# Patient Record
Sex: Female | Born: 1975
Health system: Southern US, Community
[De-identification: ages and names within clinical notes are randomized; demographics above are authoritative.]

## PROBLEM LIST (undated history)

## (undated) DIAGNOSIS — R2681 Unsteadiness on feet: Secondary | ICD-10-CM

## (undated) DIAGNOSIS — Z8489 Family history of other specified conditions: Secondary | ICD-10-CM

## (undated) DIAGNOSIS — E785 Hyperlipidemia, unspecified: Secondary | ICD-10-CM

## (undated) DIAGNOSIS — N390 Urinary tract infection, site not specified: Secondary | ICD-10-CM

## (undated) DIAGNOSIS — G709 Myoneural disorder, unspecified: Secondary | ICD-10-CM

## (undated) DIAGNOSIS — G259 Extrapyramidal and movement disorder, unspecified: Secondary | ICD-10-CM

## (undated) DIAGNOSIS — H539 Unspecified visual disturbance: Secondary | ICD-10-CM

## (undated) DIAGNOSIS — R569 Unspecified convulsions: Secondary | ICD-10-CM

## (undated) DIAGNOSIS — F419 Anxiety disorder, unspecified: Secondary | ICD-10-CM

## (undated) DIAGNOSIS — R32 Unspecified urinary incontinence: Secondary | ICD-10-CM

## (undated) DIAGNOSIS — S069X9A Unspecified intracranial injury with loss of consciousness of unspecified duration, initial encounter: Secondary | ICD-10-CM

## (undated) DIAGNOSIS — IMO0002 Reserved for concepts with insufficient information to code with codable children: Secondary | ICD-10-CM

## (undated) DIAGNOSIS — G35 Multiple sclerosis: Secondary | ICD-10-CM

## (undated) HISTORY — DX: Reserved for concepts with insufficient information to code with codable children: IMO0002

## (undated) HISTORY — DX: Extrapyramidal and movement disorder, unspecified: G25.9

## (undated) HISTORY — DX: Multiple sclerosis: G35

## (undated) HISTORY — DX: Unspecified visual disturbance: H53.9

## (undated) HISTORY — PX: COLONOSCOPY: SHX174

## (undated) HISTORY — DX: Hyperlipidemia, unspecified: E78.5

## (undated) HISTORY — DX: Myoneural disorder, unspecified: G70.9

---

## 2000-03-05 DIAGNOSIS — G35 Multiple sclerosis: Secondary | ICD-10-CM

## 2000-03-05 HISTORY — DX: Multiple sclerosis: G35

## 2001-03-05 DIAGNOSIS — R569 Unspecified convulsions: Secondary | ICD-10-CM

## 2001-03-05 DIAGNOSIS — S069X1A Unspecified intracranial injury with loss of consciousness of 30 minutes or less, initial encounter: Secondary | ICD-10-CM

## 2001-03-05 DIAGNOSIS — S069X9A Unspecified intracranial injury with loss of consciousness of unspecified duration, initial encounter: Secondary | ICD-10-CM

## 2001-03-05 HISTORY — DX: Unspecified intracranial injury with loss of consciousness of 30 minutes or less, initial encounter: S06.9X1A

## 2001-03-05 HISTORY — DX: Unspecified intracranial injury with loss of consciousness of unspecified duration, initial encounter: S06.9X9A

## 2001-03-05 HISTORY — DX: Unspecified convulsions: R56.9

## 2001-03-25 ENCOUNTER — Ambulatory Visit (HOSPITAL_COMMUNITY): Admission: RE | Admit: 2001-03-25 | Discharge: 2001-03-25 | Payer: Self-pay | Admitting: Neurology

## 2001-07-18 ENCOUNTER — Inpatient Hospital Stay (HOSPITAL_COMMUNITY): Admission: EM | Admit: 2001-07-18 | Discharge: 2001-07-20 | Payer: Self-pay | Admitting: Emergency Medicine

## 2001-07-18 ENCOUNTER — Encounter: Payer: Self-pay | Admitting: Emergency Medicine

## 2001-07-30 ENCOUNTER — Encounter: Admission: RE | Admit: 2001-07-30 | Discharge: 2001-07-30 | Payer: Self-pay | Admitting: Internal Medicine

## 2001-07-30 ENCOUNTER — Encounter: Payer: Self-pay | Admitting: Internal Medicine

## 2001-09-11 ENCOUNTER — Inpatient Hospital Stay (HOSPITAL_COMMUNITY): Admission: AC | Admit: 2001-09-11 | Discharge: 2001-09-18 | Payer: Self-pay

## 2001-09-11 ENCOUNTER — Encounter: Payer: Self-pay | Admitting: Emergency Medicine

## 2001-09-16 ENCOUNTER — Encounter: Payer: Self-pay | Admitting: Neurological Surgery

## 2002-06-22 ENCOUNTER — Other Ambulatory Visit: Admission: RE | Admit: 2002-06-22 | Discharge: 2002-06-22 | Payer: Self-pay | Admitting: Obstetrics and Gynecology

## 2003-07-02 ENCOUNTER — Other Ambulatory Visit: Admission: RE | Admit: 2003-07-02 | Discharge: 2003-07-02 | Payer: Self-pay | Admitting: Obstetrics and Gynecology

## 2004-06-10 ENCOUNTER — Emergency Department (HOSPITAL_COMMUNITY): Admission: EM | Admit: 2004-06-10 | Discharge: 2004-06-10 | Payer: Self-pay | Admitting: Emergency Medicine

## 2004-08-16 ENCOUNTER — Other Ambulatory Visit: Admission: RE | Admit: 2004-08-16 | Discharge: 2004-08-16 | Payer: Self-pay | Admitting: Gynecology

## 2004-12-27 ENCOUNTER — Other Ambulatory Visit: Admission: RE | Admit: 2004-12-27 | Discharge: 2004-12-27 | Payer: Self-pay | Admitting: Gynecology

## 2005-04-24 ENCOUNTER — Other Ambulatory Visit: Admission: RE | Admit: 2005-04-24 | Discharge: 2005-04-24 | Payer: Self-pay | Admitting: Gynecology

## 2005-08-28 ENCOUNTER — Other Ambulatory Visit: Admission: RE | Admit: 2005-08-28 | Discharge: 2005-08-28 | Payer: Self-pay | Admitting: Gynecology

## 2006-03-05 HISTORY — PX: CERVICAL CONE BIOPSY: SUR198

## 2006-03-20 ENCOUNTER — Other Ambulatory Visit: Admission: RE | Admit: 2006-03-20 | Discharge: 2006-03-20 | Payer: Self-pay | Admitting: Gynecology

## 2006-07-10 ENCOUNTER — Other Ambulatory Visit: Admission: RE | Admit: 2006-07-10 | Discharge: 2006-07-10 | Payer: Self-pay | Admitting: Gynecology

## 2006-12-10 ENCOUNTER — Other Ambulatory Visit: Admission: RE | Admit: 2006-12-10 | Discharge: 2006-12-10 | Payer: Self-pay | Admitting: Gynecology

## 2007-01-13 ENCOUNTER — Emergency Department (HOSPITAL_COMMUNITY): Admission: EM | Admit: 2007-01-13 | Discharge: 2007-01-13 | Payer: Self-pay | Admitting: Family Medicine

## 2007-09-10 ENCOUNTER — Other Ambulatory Visit: Admission: RE | Admit: 2007-09-10 | Discharge: 2007-09-10 | Payer: Self-pay | Admitting: Gynecology

## 2008-01-08 ENCOUNTER — Other Ambulatory Visit: Admission: RE | Admit: 2008-01-08 | Discharge: 2008-01-08 | Payer: Self-pay | Admitting: Gynecology

## 2008-08-12 ENCOUNTER — Emergency Department (HOSPITAL_COMMUNITY): Admission: EM | Admit: 2008-08-12 | Discharge: 2008-08-12 | Payer: Self-pay | Admitting: Family Medicine

## 2008-08-12 ENCOUNTER — Emergency Department (HOSPITAL_COMMUNITY): Admission: EM | Admit: 2008-08-12 | Discharge: 2008-08-13 | Payer: Self-pay | Admitting: Emergency Medicine

## 2008-08-20 ENCOUNTER — Ambulatory Visit (HOSPITAL_COMMUNITY): Admission: RE | Admit: 2008-08-20 | Discharge: 2008-08-20 | Payer: Self-pay | Admitting: Internal Medicine

## 2008-09-14 ENCOUNTER — Ambulatory Visit: Payer: Self-pay | Admitting: Gastroenterology

## 2008-09-14 DIAGNOSIS — R1011 Right upper quadrant pain: Secondary | ICD-10-CM | POA: Insufficient documentation

## 2008-09-14 DIAGNOSIS — G35 Multiple sclerosis: Secondary | ICD-10-CM

## 2008-09-14 DIAGNOSIS — K5900 Constipation, unspecified: Secondary | ICD-10-CM | POA: Insufficient documentation

## 2008-09-14 LAB — CONVERTED CEMR LAB
Basophils Absolute: 0.1 10*3/uL (ref 0.0–0.1)
Bilirubin, Direct: 0.1 mg/dL (ref 0.0–0.3)
CO2: 31 meq/L (ref 19–32)
Calcium: 8.9 mg/dL (ref 8.4–10.5)
Creatinine, Ser: 0.8 mg/dL (ref 0.4–1.2)
Eosinophils Absolute: 0.1 10*3/uL (ref 0.0–0.7)
Ferritin: 16.2 ng/mL (ref 10.0–291.0)
GFR calc non Af Amer: 87.05 mL/min (ref >60)
HCT: 34.7 % — ABNORMAL LOW (ref 36.0–46.0)
Hemoglobin: 12.1 g/dL (ref 12.0–15.0)
Lymphs Abs: 2 10*3/uL (ref 0.7–4.0)
MCHC: 34.8 g/dL (ref 30.0–36.0)
MCV: 90.1 fL (ref 78.0–100.0)
Monocytes Absolute: 0.3 10*3/uL (ref 0.1–1.0)
Neutro Abs: 3.1 10*3/uL (ref 1.4–7.7)
RDW: 12.3 % (ref 11.5–14.6)
Total Bilirubin: 0.4 mg/dL (ref 0.3–1.2)
Total Protein: 7.5 g/dL (ref 6.0–8.3)
Transferrin: 207.7 mg/dL — ABNORMAL LOW (ref 212.0–360.0)
Vitamin B-12: 302 pg/mL (ref 211–911)

## 2008-09-15 ENCOUNTER — Ambulatory Visit: Payer: Self-pay | Admitting: Gastroenterology

## 2008-09-20 ENCOUNTER — Telehealth: Payer: Self-pay | Admitting: Internal Medicine

## 2010-06-12 LAB — DIFFERENTIAL
Basophils Absolute: 0.1 10*3/uL (ref 0.0–0.1)
Lymphocytes Relative: 21 % (ref 12–46)
Neutro Abs: 4.5 10*3/uL (ref 1.7–7.7)
Neutrophils Relative %: 72 % (ref 43–77)

## 2010-06-12 LAB — URINALYSIS, ROUTINE W REFLEX MICROSCOPIC
Nitrite: NEGATIVE
Specific Gravity, Urine: 1.037 — ABNORMAL HIGH (ref 1.005–1.030)
Urobilinogen, UA: 1 mg/dL (ref 0.0–1.0)

## 2010-06-12 LAB — CBC
HCT: 35 % — ABNORMAL LOW (ref 36.0–46.0)
MCV: 92.4 fL (ref 78.0–100.0)
Platelets: 297 10*3/uL (ref 150–400)
WBC: 6.2 10*3/uL (ref 4.0–10.5)

## 2010-06-12 LAB — COMPREHENSIVE METABOLIC PANEL
BUN: 7 mg/dL (ref 6–23)
CO2: 27 mEq/L (ref 19–32)
Chloride: 106 mEq/L (ref 96–112)
Creatinine, Ser: 0.63 mg/dL (ref 0.4–1.2)
GFR calc non Af Amer: >60 mL/min (ref >60)
Total Bilirubin: 0.5 mg/dL (ref 0.3–1.2)

## 2010-06-12 LAB — LIPASE, BLOOD: Lipase: 19 U/L (ref 11–59)

## 2010-07-21 NOTE — Discharge Summary (Signed)
[place]. [facility]  Patient:    Ann Oconnor, Ann Oconnor Visit Number: [id number] MRN: [id number]          Service Type: TRA Location: 3000 3039 01 Attending Physician:  Trauma, Md Dictated by:   Eugenia Pancoast, P.A. Admit Date:  09/11/2001 Discharge Date: 09/18/2001   CC:         Ann Oconnor, M.D.  Stefani Dama, M.D.  Dr. Vickey Huger   Discharge Summary  DATE OF BIRTH: Dec 03, 1975  DISCHARGE DIAGNOSES: 1. Fall. 2. Closed head injury. 3. Subarachnoid hemorrhage. 4. Subdural hemorrhage. 5. Multiple sclerosis.  HISTORY OF PRESENT ILLNESS: The patient is a 35 year old female who is followed by [facility] and also by Dr. Kelli Hope for her multiple sclerosis. The patient was working with PT on the day of admission and apparently fell. She has had a  previous episode of falling about two months ago as well. At this point, she was brought into [facility] emergency room and workup was done. Glasgow coma scale was approximately 10-11. Vital signs were stable. She was somewhat combative. She did respond somewhat to verbal commands. Subsequently, CT scan was done and reviewed and was noted to have subarachnoid blood in the left frontal sinus and small left subdural hematoma. The patient was subsequently hospitalized.  HOSPITAL COURSE: The patient was seen by Dr. Danielle Dess who had followed her for her subarachnoid and subdural hematomas. Repeat CTs were done while in the hospital and they showed improvement of the subdural and subarachnoid hemorrhages. She was also seen by Dr. Vickey Huger for follow-up of her multiple sclerosis. She was started on her Dilantin 100 mg caps at bedtime. She was also started on her usual home medications which were Copaxone 20 mg one injection daily, Paxil CR 20 mg q.d., and Rebit injection on Monday, Wednesday, and Friday. She slow improved. Initially, she would not respond to verbal  stimuli but finally she did awake and began responding appropriately. She had a bedside swallow evaluation on September 15, 2001 which was appropriate. She did well with this. Subsequently, diet was started and she advanced as tolerated. PT was consulted and saw the patient. On September 17, 2001 arrangements were made to have the patient undergo follow-up for outpatient PT and OT. Neurology saw the patient and noted that the patient was able to be discharged. She is doing quite well at this time. She was appropriate and tolerating a diet satisfactorily.  DISPOSITION: At this time, she was prepared for discharge.  DISCHARGE MEDICATIONS: 1. Dilantin 100 mg three pills at bedtime. 2. Vicodin one or two p.o. q.4-6h. p.r.n. pain, #20 with no refills.  FOLLOW-UP: With Dr. Danielle Dess in approximately four to six week. Follow-up with Dr. Thad Ranger and her usual medical doctors as appropriate. She is to call Trauma Service if she has any questions or problems.  DISCHARGE CONDITION: Stable and satisfactory condition. Dictated by:   Eugenia Pancoast, P.A. Attending Physician:  Trauma, Md DD:  09/18/01 TD:  09/22/01 Job: [id number]

## 2010-07-21 NOTE — Discharge Summary (Signed)
[place]. [facility]  Patient:    STARLENE, CONSUEGRA Visit Number: [id number] MRN: [id number]          Service Type: MED Location: 3100 3105 01 Attending Physician:  Cristi Loron Dictated by:   Stefani Dama, M.D. Admit Date:  07/17/2001 Discharge Date: 07/20/2001                             Discharge Summary  ADMISSION DIAGNOSIS:  Closed head injury with skull fracture secondary to fall.  CONDITION ON DISCHARGE:  Improving.  HOSPITAL COURSE:  The patient is a 35 year old individual who fell backwards striking her head on a carpet.  There is no loss of consciousness.  The patient had subsequent headache and was seen at [facility].  Her CT scan demonstrated the presence of a skull fracture and some subarachnoid blood.  She complained of significant headache, had some nausea, vomiting, and dizziness but otherwise was neurologically intact.  She was observed in the hospital during her first 24 hours.  She continued to have significant problems with nausea and headache.  In her second 24 hours, this seemed to improve significantly.  The patient was noted to have some battle sign develop behind the right ear, and this will likely get worse over time.  She has been treated with Darvocet as needed for pain.  At this time, she was discharged to home.  She has been advised to stay out of work at least until August 04, 2001, and the patient can return after that time. She was given a prescription for Darvocet-N 100 #40 without refills. Dictated by:   Stefani Dama, M.D. Attending Physician:  Tressie Stalker D DD:  07/20/01 TD:  07/22/01 Job: [id number]

## 2010-07-21 NOTE — Consult Note (Signed)
[place]. [facility]  Patient:    Ann Oconnor, Ann Oconnor Visit Number: [id number] MRN: [id number]          Service Type: TRA Location: 3000 3039 01 Attending Physician:  Trauma, Md Dictated by:   Stefani Dama, M.D. Proc. Date: 09/11/01 Admit Date:  09/11/2001                            Consultation Report  REQUESTING PHYSICIAN:  Sandria Bales. Ezzard Standing, M.D.  REASON FOR REQUEST:  Closed head injury.  HISTORY OF PRESENT ILLNESS:  The patient is a 35 year old white female, who apparently fell while at physical therapy today.  She had a similar fall apparently about two months ago, where she struck the right parietal occipital region, sustaining a linear skull fracture.  A slight amount of subarachnoid hemorrhage was noted along the convexity on the left hemisphere.  She recovered from that and seemed to be doing well.  The patient has an underlying history of multiple sclerosis which makes her have some difficulty with steadiness on her feet.  She was undergoing physical therapy today and while apparently being upright, fell backwards, striking the right parietal occipital region of her scalp.  She then had a seizure.  The patient was brought to the [facility] Emergency Room.  CT scan of the brain was performed.  This demonstrates that the patient has a significant subgaleal contusion in the right parietal occipital region and again has demonstrated subarachnoid hemorrhage in the left hemisphere, but now there is in addition, a thin, left, subdural hematoma over the convexity.  There is also a slight amount of subarachnoid hemorrhage in the very vertex of the right hemisphere. Since the patients arrival, she has been rather combative when stimulated, but otherwise will tend to sleep with her eyes closed.  She had vomited once on arrival in the emergency department.  Past medical history is as noted.  The patient is currently on interferon injections 3  times a week for her multiple sclerosis in addition to ______ which is taken subcutaneously daily.  She is also on Paxil and another nerve medication.  She is followed by Dr. Jacki Cones.  PHYSICAL EXAMINATION:  VITAL SIGNS:  Heart rate 91, blood pressure normal.  HEAD:  An area of ecchymosis with an abrasion of the scalp over the right parietal occipital region.  Tympanic membranes are clear.  The pupils are 5 mm and sluggish ______  reactive.  The extraocular movements are full.  GENERAL:  The patient sits sleeping quietly unless stimulated to pain, in which case she makes some uncomprehensible utterances.  She does move all four extremities.  She does not follow commands.  NEUROLOGIC:  Deep tendon reflexes are 2+ in both biceps and triceps, 2+ in the patellae and 1+ in the Achilles.  Babinski is downgoing.  Sensation is not testable, as the patient is noncooperative.  IMPRESSION:  The patient has evidence of a closed head injury.  I discussed the significance of this head injury, which seems to be more severe than her previous injury two months ago, which had it taken care of at that time.  I have advised that the patient should be observed in the intensive care unit. She will be seen in the morning with a repeat CT scan being performed to rule out the progression of any injury.  Dictated by:   Stefani Dama, M.D.  Attending Physician:  Trauma,  Md DD:  09/11/01 TD:  09/15/01 Job: [id number]

## 2010-07-21 NOTE — H&P (Signed)
[place]. [facility]  Patient:    Ann Oconnor, Ann Oconnor Visit Number: [id number] MRN: [id number]          Service Type: MED Location: [id number] Attending Physician:  Cristi Loron Dictated by:   Cristi Loron, M.D. Admit Date:  07/17/2001   CC:         Dr. Lin Givens at [facility]   History and Physical  CHIEF COMPLAINT:  Headache.  HISTORY OF PRESENT ILLNESS:  The patient is a 35 year old white female who was in her usual state of health until last evening when a neighbors poodle startled her.  She fell backwards, striking her head on carpet.  There was no loss of consciousness.  This was witnessed by her boyfriend.  The patient had a subsequent headache and was brought to [facility] emergency department via private vehicle where she was evaluated including a cranial CT scan which demonstrated skull fracture and subarachnoid hemorrhage, and neurosurgical consultation was requested.  Presently, the patient complains of headache.  She denies neck pain, back pain, chest pain, abdominal pain, numbness and tingling, seizures, problems with vision and hearing, etc.  She has not noticed any CSF, otorrhea, or rhinorrhea.  She has had some nausea and vomiting.  PAST MEDICAL HISTORY:  Positive for multiple sclerosis which was diagnosed in December 2002.  This is managed by Dr. Lin Givens, a neurologist at [facility].  PAST SURGICAL HISTORY:  None.  MEDICATIONS:  Rebif three times per week.  ALLERGIES:  No known drug allergies.  FAMILY MEDICAL HISTORY:  The patients mother is age 41 in good health.  The patients father is 60 in good health.  SOCIAL HISTORY:  The patient is single, she has no children, she lives in [place].  She is employed in Clinical biochemist for an Scientist, forensic. She denies tobacco, ethanol, or drug use.  REVIEW OF SYSTEMS:  Is as above.  She only complains of headache and  mild photophobia.  PHYSICAL EXAMINATION:  GENERAL:  A pleasant, mildly obese 35 year old white female complaining of headache.  VITAL SIGNS:  Blood pressure 110/55, heart rate 80, respiratory rate 12, oxygen saturation 96% on room air.  HEENT:  Normocephalic.  She has some tenderness with palpation and some soft tissue swelling in the occipital region.  No lacerations.  Her pupils equal and round, and react to light.  Extraocular muscles are intact.  Oropharynx benign, uvula midline.  There is no battle signs, raccoons eyes.  No CSF, otorrhea or rhinorrhea.  NECK:  Supple.  There are no masses, deformities, tracheal deviation.  She has a good cervical range of motion.  Spurlings test is negative.  Lhermittes sign was not present.  THORAX:  Symmetric.  LUNGS:  Clear to auscultation.  HEART:  Regular rate and rhythm.  ABDOMEN:  Soft, nontender.  EXTREMITIES:  No obvious deformities.  BACK:  Normal.  No point tenderness or deformities.  NEUROLOGIC:  The patient is alert and oriented x3.  Cranial nerves II-XII are grossly intact bilaterally.  Vision and hearing are grossly normal bilaterally.  Motor strength is 5/5 in the bilateral deltoid, biceps, triceps, hand grips, wrist extensors, interosseus, psoas, quadriceps, gastrocnemius, extensor hallucis longus.  Sensory exam is intact to light touch in all tested dermatomes bilaterally.  Deep tendon reflexes are 2+/4 in her bilateral biceps, triceps, brachioradialis, quadriceps, gastrocnemius.  She has bilateral flexor plantar reflexes.  No ankle clonus.  LABORATORY DATA:  Imaging studies:  I have reviewed the  patients cranial CT scan performed without contrast at [facility] on Jul 18, 2001.  It demonstrates the patient has a right occipital skull fracture and a second fracture higher up in the right posterior parietal region.  Both fractures are nondisplaced.  She has a small left sylvian fissure subarachnoid  hemorrhage and a possible very small interhemispheric subdural hematoma anteriorly, both without mass effect.  ASSESSMENT AND PLAN:  Closed head injury/skull fracture/subarachnoid hemorrhage.  I have discussed the situation with the patient and her mother and told her that these are not likely to cause surgical problems.  I have recommended we continue to observe her and repeat her CAT scan tomorrow.  If it looks good and she feels better, I will send her home. Dictated by:   Cristi Loron, M.D. Attending Physician:  Tressie Stalker D DD:  07/18/01 TD:  07/19/01 Job: [id number]

## 2010-07-21 NOTE — H&P (Signed)
[place]. [facility]  Patient:    Ann Oconnor, Ann Oconnor Visit Number: [id number] MRN: [id number]          Service Type: TRA Location: 1800 1829 01 Attending Physician:  Trauma, Md Dictated by:   Sandria Bales. Ezzard Standing, M.D. Admit Date:  09/11/2001   CC:         Kelli Hope, M.D.  Stefani Dama, M.D.  Melvyn Novas, M.D.  Dr. Lin Givens, [facility]  [facility]   History and Physical  DATE OF BIRTH:  1975-12-20  HISTORY OF PRESENT ILLNESS:  Ann Oconnor is a 35 year old, white female, who is followed by [facility] and Dr. Kelli Hope for multiple sclerosis.  She was working out Quarry manager with physical therapy at [employer], when she fell, apparently struck her head and had a seizure. The exact sequence is unclear.  She presented to the [facility] Emergency Room as a goal trauma.  She had a Glasgow Coma Scale of between 10-11 but stable vital signs, somewhat combative, not really making sense as far as her words, did respond somewhat to verbal commands.  PAST MEDICAL HISTORY:  She has no allergies.  CURRENT MEDICATIONS:  Rebif 1 shot 3 times a week (this is interferon beta-1a), Copaxone subcu q.d., Paxil 25 mg q.d., and her mother thinks she is taking one more pill for her nerves.  REVIEW OF SYSTEMS:  NEUROLOGIC:  She has had a diagnosis of multiple sclerosis.  Apparently she has had three falls now in the last 3-4 months. Each one seems more serious.  She was actually hospitalized about two months ago with another fall.  PULMONARY:  Does not smoke cigarette, no pneumonia or tuberculosis.  CARDIAC:  No evidence of heart disease, chest pain, hypertension, or dysrhythmia.  GASTROINTESTINAL:  No history of pancreatic disease, liver disease, change in bowel habits.  UROLOGIC:  ______. GYN:  As far as her mother knows, she has regular periods and has never been pregnant.  She is right-handed.  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  PHYSICAL EXAMINATION:  VITAL SIGNS:  Pulse is about 90, blood pressure 130/80.  GENERAL:  She is confused, has vomited and smells of vomit.  HEENT:  Her head shows no obvious laceration.  Her pupils are wandering.  She really cannot follow extraocular movement command, and they are about 3-4+ and symmetric.  She has no obvious oral lesions.  She is in a cervical collar.  LUNGS:  Clear to auscultation.  HEART:  Regular rate and rhythm.  ABDOMEN:  Soft.  She has an old bruise to her left abdomen.  EXTREMITIES:  Along her anterior thigh, she has a couple of bruits which may be newer, but there is no obvious long bone injury of her upper or lower extremities.  She moves all extremities, though entirely purposefully.  I have reviewed the CT scan with Charlett Nose, M.D.  It shows some subarachnoid blood in left frontal sinus, a small left subdural, and an occipital fascia which may very well be old.  She also at this time has pending x-rays of her C-spine, chest x-ray, lumbar spine, and thoracic spine.  Her hemoglobin is 12.1, hematocrit 13.8, white blood count is 4900.  Her PT is 12.7, INR 0.9.  Her PTT is 25.  Sodium 138, potassium 3.3, chloride 104, BUN 11, glucose 119.  ADMISSION IMPRESSION: 1. Fall which led to subarachnoid and subdural blood.  Have consulted  neurosurgery wit.h Dr. Danielle Dess, who has seen her before and    Melvyn Novas, M.D., who is the neurologist on call for Dr. Thad Ranger. 2. Seizure, questionably secondary to fall and bleed.  Will leave dosing of    anticonvulsants to Dr. Danielle Dess and Dr. Vickey Huger. 3. Multiple sclerosis which may actually be the source of her falling. 4. She is mildly hypokalemic.  We will repeat her labs and give her potassium    today. Dictated by:   Sandria Bales. Ezzard Standing, M.D. Attending Physician:  Trauma, Md DD:  09/11/01 TD:  09/11/01 Job: [id number]

## 2010-07-21 NOTE — Consult Note (Signed)
[place]. [facility]  Patient:    Ann Oconnor, Ann Oconnor Visit Number: [id number] MRN: [id number]          Service Type: TRA Location: 3000 3039 01 Attending Physician:  Trauma, Md Dictated by:   Melvyn Novas, M.D. Proc. Date: 09/11/01 Admit Date:  09/11/2001 Discharge Date: 09/18/2001                            Consultation Report  REASON FOR CONSULTATION:  This patient presented to the emergency room after a fall with loss of consciousness and head trauma.  The patient is seen by Dr. Sandria Bales. Ezzard Standing here at the [facility] Emergency Room.  She is accompanied by the physical therapy with whom she was with at the time of the incident.  Her parents have also just arrived.  HISTORY OF PRESENT ILLNESS:  The patient has undergone physical therapy at the time when she "fell backwards" and hit the floor hard with her head.  She was unconscious and the EMS squad was called.  She remained nonresponsive.  Here in the emergency room, she has just arrived and seem to be in seizing.  She presents to the major trauma room and has now responsiveness to auditory and visual stimuli but she is combative and inappropriate and her affect seems to be highly anxious and needs to be restrained to undergo the necessary CT imaging.  PAST MEDICAL HISTORY:  She is a patient of Dr. Kelli Hope at our office at [facility].  He follows her for multiple sclerosis for which she also sees Dr. Lin Givens at [facility].  She is without history of seizures but had one previous event of sudden loss of consciousness where she also fell backwards and also suffered a skull fracture in a fashion equivalent to the events tonight.  CURRENT MEDICATIONS: 1. Levbid once a week. 2. ______ subcutaneously q.d. 3. Paxil 25 mg q.d. 4. Occasional Xanax.  REVIEW OF SYSTEMS:  The patient has a fall in the past according to her mother. Those were like her "legs  were giving out on her."  She is sometimes more tremulous or feels in general weak but had only one other fall in the past that resembles today picture.  No history of pulmonary complaints.  She has no heart disease.  According to the mother, had only a transient period of urinary incontinence while having a urinary tract infection.  She has never missed her period and has never been pregnant.  SOCIAL HISTORY:  She is a right-handed 35 year old who works for the [employer].  The patient is single and lives with her parents.  Nonsmoker and nondrinker.  Scientific laboratory technician.  High school graduate.  Graduate of a Nurse, adult for [employer].  FAMILY HISTORY:  Negative for neurologic diseases.  PHYSICAL EXAMINATION:  VITAL SIGNS:  Heart rate 98, blood pressure 132/80.  LUNGS:  Clear to auscultation.  HEART:  Regular rate and rhythm.  No murmur.  ABDOMEN:  Soft, nontender, and nondistended.  EXTREMITIES:  No edema.  No clubbing and no cyanosis.  NEUROLOGICAL:  The patient is unable to focus her gaze.  The pupils are bilaterally 4 mm and she has sluggish reaction to light.  She avoids the light beam, however, and cannot really follow an accommodation or extraocular movement evaluation.  There is no evidence of facial asymmetry or sensory loss.  Uvula feels midline.  I cannot feel evidence of a tongue bite.  The neck is supple.  The patient is restrained in a collar until cleared by x-ray. Her speech again is somewhat slurred and has now become clearer.  The patient is calling for help and is becoming more combative.  Motor shows the patient moves spontaneously all four extremities.  She had to be restrained.  Deep tendon reflexes are not elicited.  I can also not find a Babinski reflex. Sensory exam had to be deferred under the circumstances.  She does have coordination, gait, and stance.  ASSESSMENT: 1. Status post fall with subarachnoid and subdural black by  CT:  She will    be seen by neurosurgery. 2. Seizure:  This is questionable as the patient seems to have had a tonic    stiffness when she fell backwards without any evidence of protective    reflexes; however, a convulsion was not seen.  The patient was not    incontinent, had no tongue bite so that I am not convinced that a seizure    is the only possible reason for this loss of consciousness and if a seizure    occurred it would have been secondary to the trauma or preceding this    event. 3. History of multiple sclerosis:  Actually, the source of her falling    might be related to an autonomic dysfunction.  The patient is mildly    hypokalemic.  PLAN:  She will be admitted to Dr. Sandria Bales. Ezzard Standing, attending physician of the trauma service, tonight and be followed in the neurological ICU at 3100 floor. The patient will receive 1 gm of Celebrex as a loading dose and converge to 300 mg q.d. either p.o. or IV Celebrex and follow up q.d.  Electrolytes will be balanced according to tomorrows Chem-7.  Tonight, she will receive some potassium supplementation.  She also will continue her MS medications.  A variety of x-rays were diagnostic evaluation of the extent of trauma are still pending and further treatment by the trauma surgeons will depend on both outcomes.  I thank Dr. Sandria Bales. Ezzard Standing very much for this consultation and will inform Dr. Kelli Hope in the morning about the admission of this patient. Dictated by:   Melvyn Novas, M.D. Attending Physician:  Trauma, Md DD:  09/12/01 TD:  09/15/01 Job: [id number]

## 2010-10-26 ENCOUNTER — Ambulatory Visit (HOSPITAL_COMMUNITY)
Admission: RE | Admit: 2010-10-26 | Discharge: 2010-10-26 | Disposition: A | Discharge status: Home / Self Care | Payer: BC Managed Care – PPO | Source: Ambulatory Visit | Attending: Orthopaedic Surgery | Admitting: Orthopaedic Surgery

## 2010-10-26 ENCOUNTER — Other Ambulatory Visit (HOSPITAL_COMMUNITY): Payer: Self-pay | Admitting: Orthopaedic Surgery

## 2010-10-26 ENCOUNTER — Other Ambulatory Visit (HOSPITAL_COMMUNITY): Payer: Self-pay

## 2010-10-26 DIAGNOSIS — M542 Cervicalgia: Secondary | ICD-10-CM

## 2010-10-26 DIAGNOSIS — M79609 Pain in unspecified limb: Secondary | ICD-10-CM | POA: Insufficient documentation

## 2010-10-26 DIAGNOSIS — R9389 Abnormal findings on diagnostic imaging of other specified body structures: Secondary | ICD-10-CM | POA: Insufficient documentation

## 2011-07-24 ENCOUNTER — Ambulatory Visit: Payer: BC Managed Care – PPO | Admitting: Gynecology

## 2011-08-21 ENCOUNTER — Ambulatory Visit (INDEPENDENT_AMBULATORY_CARE_PROVIDER_SITE_OTHER): Payer: PRIVATE HEALTH INSURANCE | Admitting: Gynecology

## 2011-08-21 ENCOUNTER — Encounter: Payer: Self-pay | Admitting: Gynecology

## 2011-08-21 ENCOUNTER — Other Ambulatory Visit (HOSPITAL_COMMUNITY)
Admission: RE | Admit: 2011-08-21 | Discharge: 2011-08-21 | Disposition: A | Discharge status: Home / Self Care | Payer: PRIVATE HEALTH INSURANCE | Source: Ambulatory Visit | Attending: Gynecology | Admitting: Gynecology

## 2011-08-21 VITALS — BP 116/68 | Ht 64.0 in | Wt 223.0 lb

## 2011-08-21 DIAGNOSIS — Z01419 Encounter for gynecological examination (general) (routine) without abnormal findings: Secondary | ICD-10-CM

## 2011-08-21 DIAGNOSIS — R87810 Cervical high risk human papillomavirus (HPV) DNA test positive: Secondary | ICD-10-CM | POA: Insufficient documentation

## 2011-08-21 DIAGNOSIS — N912 Amenorrhea, unspecified: Secondary | ICD-10-CM

## 2011-08-21 LAB — TSH: TSH: 2.871 u[IU]/mL (ref 0.350–4.500)

## 2011-08-21 LAB — PROLACTIN: Prolactin: 4.3 ng/mL

## 2011-08-21 NOTE — Progress Notes (Signed)
Ann Oconnor 12-26-1975 [id number]        36 y.o. G0 new patient for annual exam.  Past medical history,surgical history, medications, allergies, family history and social history were all reviewed and documented in the EPIC chart. ROS:  Was performed and pertinent positives and negatives are included in the history.  Exam: Sherrilyn Rist chaperone present Filed Vitals:   08/21/11 1031  BP: 116/68   General appearance  Normal Skin grossly normal Head/Neck normal with no cervical or supraclavicular adenopathy thyroid normal Lungs  clear Cardiac RR, without RMG Abdominal  soft, nontender, without masses, organomegaly or hernia Breasts  examined lying and sitting without masses, retractions, discharge or axillary adenopathy. Pelvic  Ext/BUS/vagina  normal   Cervix  Status post cone changes Pap/HPV  Uterus  anteverted, normal size, shape and contour, midline and mobile nontender   Adnexa  Without masses or tenderness    Anus and perineum  normal   Rectovaginal  normal sphincter tone without palpated masses or tenderness.    Assessment/Plan:  36 y.o. female for annual exam.    1. Amenorrhea. Patient had been on Depo-Provera from 2011 through September 2012. Has not had a menses during this time nor since her last shot September 2012. She was having regular menses before starting Depo-Provera.  She wants to restart Depo-Provera for menstrual suppression. She is not currently sexually active and it has been over 2 years. Not having hot flushes/night sweats, weight/skin/hair changes or other symptoms. We'll check baseline hCG TSH FSH prolactin. Assuming all negative plan progesterone withdrawal. Discussed Depo-Provera and the issues of long-term use to include loss of calcium and the FDA black box warning. Alternatives to include observation, combination pill/patch/ring, Mirena IUD, Implanon reviewed. Patient's interested in the less frequent/no menses aspect and not necessarily contraception as she is  not sexual active.  Patient wants to proceed with Depo-Provera and will tentatively plan progesterone withdrawal and then Depo-Provera following. 2. Breast health. SBE monthly reviewed. Screening mammogram recommendations between 35 and 40 discussed. She has no strong family history and prefers to wait closer to 40. 3. Pap smear. Pap/HPV done today given her history of cone biopsy. I have no records of these results and she is going to get me a copy of these. 4. Health maintenance. No other blood work was done today as routine health maintenance and blood work is done through her primary physician's office who she sees on a regular basis. 5.    Dara Lords MD, 11:07 AM 08/21/2011

## 2011-08-21 NOTE — Addendum Note (Signed)
Addended by: Richardson Chiquito on: 08/21/2011 11:42 AM   Modules accepted: Orders

## 2011-08-21 NOTE — Patient Instructions (Signed)
Follow up for hormone results. 

## 2011-08-22 ENCOUNTER — Telehealth: Payer: Self-pay | Admitting: Gynecology

## 2011-08-22 MED ORDER — MEDROXYPROGESTERONE ACETATE 10 MG PO TABS: 10.0000 mg | ORAL_TABLET | Freq: Every day | ORAL | Status: DC

## 2011-08-22 NOTE — Telephone Encounter (Signed)
Pt informed with the below note. 

## 2011-08-22 NOTE — Telephone Encounter (Signed)
Tell patient that her hormone studies were all normal. We'll go with progesterone withdrawal as we discussed with Provera 10 mg daily x10 days. She can then arrange for Depo-Provera 150 mg during the withdrawal period time.

## 2011-08-23 ENCOUNTER — Telehealth: Payer: Self-pay | Admitting: *Deleted

## 2011-08-23 ENCOUNTER — Encounter: Payer: Self-pay | Admitting: Gynecology

## 2011-08-23 NOTE — Telephone Encounter (Signed)
Pt was given provera 10 mg tablets x 10 days, pt said she will be going on vacation in July and doesn't want her period to start while on trip she will wait to take the provera.pt said tf was aware of this as well.

## 2011-09-11 ENCOUNTER — Ambulatory Visit (INDEPENDENT_AMBULATORY_CARE_PROVIDER_SITE_OTHER): Payer: PRIVATE HEALTH INSURANCE | Admitting: Gynecology

## 2011-09-11 ENCOUNTER — Encounter: Payer: Self-pay | Admitting: Gynecology

## 2011-09-11 DIAGNOSIS — R87610 Atypical squamous cells of undetermined significance on cytologic smear of cervix (ASC-US): Secondary | ICD-10-CM

## 2011-09-11 DIAGNOSIS — M545 Low back pain: Secondary | ICD-10-CM

## 2011-09-11 DIAGNOSIS — N898 Other specified noninflammatory disorders of vagina: Secondary | ICD-10-CM

## 2011-09-11 DIAGNOSIS — R87810 Cervical high risk human papillomavirus (HPV) DNA test positive: Secondary | ICD-10-CM

## 2011-09-11 LAB — URINALYSIS W MICROSCOPIC + REFLEX CULTURE
Bilirubin Urine: NEGATIVE
Casts: NONE SEEN
Glucose, UA: NEGATIVE mg/dL
Hgb urine dipstick: NEGATIVE
Protein, ur: NEGATIVE mg/dL
RBC / HPF: NONE SEEN RBC/hpf (ref <3)
pH: 5.5 (ref 5.0–8.0)

## 2011-09-11 NOTE — Patient Instructions (Signed)
Office will call you with the biopsy results 

## 2011-09-11 NOTE — Progress Notes (Signed)
Patient ID: EISA CONAWAY, female   DOB: [birth year], 36 y.o.   MRN: [id number] Patient presents for colposcopy. She has a history of cone biopsy for CIN 2 in 2008. She most recently had CIN-1 on colposcopic biopsy with negative ECC 08/2010 at another practice. Her most recent Pap smear here showed ASCUS with positive high-risk HPV.  Exam with Sherrilyn Rist Asst. Pelvic: External BUS vagina with cervix grossly normal high in the vault. Bulging left lateral fornix consistent with submucosal cyst. Bimanual without other palpable vaginal masses or cysts. Uterus grossly normal midline mobile nontender. Adnexa without masses or tenderness.  Colposcopy: Adequate after acetic acid cleansed with small area of acetowhite change at 6:00 transformation zone. Small patch of acetowhite change overlying left lateral vaginal fornix submucosal cyst. Biopsy of the 6:00 transformation zone and acetowhite change overlying the cyst taken. ECC performed. Subsequently cyst wall was biopsied and emptied of mucus material and this biopsy was sent to pathology also.  Physical Exam  Genitourinary:     Assessment and plan: 1. 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 Colposcopy shows 2 areas of acetowhite change both biopsied. ECC performed. Patient will follow up for results. If low-grade and plan expectant management with Pap/HPV 2 testing in one year. 2. Left vaginal fornix cyst. Biopsied with mucoid material and obtaining it. We'll follow expectantly assuming biopsy normal. 3. Low back pain. Patient notes some low back pain. Her UA is unremarkable. We'll follow up on culture. Her exam shows spine straight without CVA tenderness, muscle spasm or other abnormalities.

## 2011-09-13 ENCOUNTER — Telehealth: Payer: Self-pay | Admitting: Gynecology

## 2011-09-13 MED ORDER — NITROFURANTOIN MONOHYD MACRO 100 MG PO CAPS: 100.0000 mg | ORAL_CAPSULE | Freq: Two times a day (BID) | ORAL | Status: AC

## 2011-09-13 NOTE — Telephone Encounter (Signed)
Tell patient that her urine culture did grow out bacteria and I want to treat her with Macrobid 100 mg twice daily for one week

## 2011-09-13 NOTE — Telephone Encounter (Signed)
Pt informed with the below note. 

## 2011-09-14 ENCOUNTER — Telehealth: Payer: Self-pay | Admitting: *Deleted

## 2011-09-14 NOTE — Telephone Encounter (Signed)
Tell patient that biopsies showed low-grade changes and a benign vaginal cyst. Recommend repeat Pap smear/HPV cotest in one year.

## 2011-09-14 NOTE — Telephone Encounter (Signed)
Pt calling requesting recent pathology results,please advise

## 2011-09-14 NOTE — Telephone Encounter (Signed)
Left message for pt to call.

## 2011-09-14 NOTE — Telephone Encounter (Signed)
Pt informed with the below note. 

## 2011-09-17 LAB — URINE CULTURE

## 2011-09-18 ENCOUNTER — Telehealth: Payer: Self-pay | Admitting: Gynecology

## 2011-09-18 MED ORDER — AMPICILLIN 500 MG PO CAPS: 500.0000 mg | ORAL_CAPSULE | Freq: Four times a day (QID) | ORAL | Status: AC

## 2011-09-18 NOTE — Telephone Encounter (Signed)
Pt informed with the below note. 

## 2011-09-18 NOTE — Telephone Encounter (Signed)
Tell patient that the final culture for urine appears to be resistant to the antibiotic that we chose.  I want to switch her to ampicillin 500 mg 4 times a day x5 days.

## 2011-12-03 DIAGNOSIS — R413 Other amnesia: Secondary | ICD-10-CM | POA: Insufficient documentation

## 2012-01-30 ENCOUNTER — Other Ambulatory Visit: Payer: Self-pay | Admitting: Neurology

## 2012-01-30 DIAGNOSIS — R413 Other amnesia: Secondary | ICD-10-CM

## 2012-01-30 DIAGNOSIS — G35 Multiple sclerosis: Secondary | ICD-10-CM

## 2012-02-07 ENCOUNTER — Ambulatory Visit
Admission: RE | Admit: 2012-02-07 | Discharge: 2012-02-07 | Disposition: A | Discharge status: Home / Self Care | Payer: No Typology Code available for payment source | Source: Ambulatory Visit | Attending: Neurology | Admitting: Neurology

## 2012-02-07 DIAGNOSIS — R413 Other amnesia: Secondary | ICD-10-CM

## 2012-02-07 DIAGNOSIS — G35 Multiple sclerosis: Secondary | ICD-10-CM

## 2012-02-07 DIAGNOSIS — G35D Multiple sclerosis, unspecified: Secondary | ICD-10-CM

## 2012-02-07 MED ORDER — GADOBENATE DIMEGLUMINE 529 MG/ML IV SOLN
20.0000 mL | Freq: Once | INTRAVENOUS | Status: AC | PRN
  Administered 2012-02-07: 20 mL via INTRAVENOUS

## 2012-05-30 ENCOUNTER — Encounter: Payer: Self-pay | Admitting: Neurology

## 2012-05-30 ENCOUNTER — Telehealth: Payer: Self-pay

## 2012-05-30 DIAGNOSIS — R413 Other amnesia: Secondary | ICD-10-CM

## 2012-05-30 NOTE — Telephone Encounter (Signed)
I have called her, she complains of lip and tongue numbness since March 26th 2014. No dysarthria, she denies new medication, no other allergic symptoms could potentially do to her MS, she is still taking Gilenya keep her followup,

## 2012-05-30 NOTE — Telephone Encounter (Signed)
Patient states that  She is having problems with her tongue and her lips feeling numb. Patient states if Dr.Yan can call me back and advise me on if this is MS  Related. Tired to schedule apt. Patient did not want to schedule at this time just wanted a call back.

## 2012-06-02 ENCOUNTER — Telehealth: Payer: Self-pay

## 2012-06-02 MED ORDER — METHYLPREDNISOLONE (PAK) 4 MG PO TABS: ORAL_TABLET | ORAL | Status: DC

## 2012-06-02 NOTE — Telephone Encounter (Signed)
I have called, she complains of numbness, tingling at her right face, is not getting better, very bothersome, she denies fever, other upper respiratory or urinary tract infection signs,.  I have called in Medrol Pak for her for multiple sclerosis flareup, likely involving her right brain STEM

## 2012-06-02 NOTE — Telephone Encounter (Signed)
Patient calling states she is still having numbness. Patient wants Dr.Yan to call her in RX.

## 2012-06-09 ENCOUNTER — Telehealth: Payer: Self-pay

## 2012-06-09 NOTE — Telephone Encounter (Signed)
I have called, she still has numbness on right chin lip, no dysarthria, has to be careful when chewing, like MS flare up,

## 2012-06-09 NOTE — Telephone Encounter (Signed)
Patient states she is finished her steroid and she is still having numbness and tingling in her face. Please advise ?

## 2012-06-13 ENCOUNTER — Telehealth: Payer: Self-pay

## 2012-06-13 DIAGNOSIS — G35 Multiple sclerosis: Secondary | ICD-10-CM

## 2012-06-13 NOTE — Telephone Encounter (Signed)
Give her a follow up appt.

## 2012-06-13 NOTE — Telephone Encounter (Signed)
Patient states Dr.Yan wanted me to call back if I was not doing any better still having Numbness please advise.

## 2012-06-18 NOTE — Telephone Encounter (Signed)
Called Patient left her message asking her to call me back for apt.

## 2012-06-19 NOTE — Telephone Encounter (Signed)
Patient wants all her records sent to Dr.Micheal Reynolds at [facility] . Will you ask Dr.Yan for a referral . I have called his office and office states that Referral has to come from Dr.Yan.

## 2012-07-04 DIAGNOSIS — G35 Multiple sclerosis: Secondary | ICD-10-CM | POA: Insufficient documentation

## 2012-07-20 ENCOUNTER — Other Ambulatory Visit: Payer: Self-pay | Admitting: Neurology

## 2012-08-19 ENCOUNTER — Telehealth: Payer: Self-pay | Admitting: *Deleted

## 2012-08-19 NOTE — Telephone Encounter (Signed)
Message copied by Monico Blitz on Tue Aug 19, 2012  2:11 PM ------      Message from: Eugenie Birks      Created: Tue Aug 19, 2012  1:18 PM      Contact: patient       Needs a copy of MRI sent to Dr. Kelli Hope at [facility]. ------

## 2012-09-02 ENCOUNTER — Ambulatory Visit (INDEPENDENT_AMBULATORY_CARE_PROVIDER_SITE_OTHER): Payer: BC Managed Care – PPO | Admitting: Gynecology

## 2012-09-02 ENCOUNTER — Other Ambulatory Visit (HOSPITAL_COMMUNITY)
Admission: RE | Admit: 2012-09-02 | Discharge: 2012-09-02 | Disposition: A | Discharge status: Home / Self Care | Payer: BC Managed Care – PPO | Source: Ambulatory Visit | Attending: Gynecology | Admitting: Gynecology

## 2012-09-02 ENCOUNTER — Encounter: Payer: Self-pay | Admitting: Gynecology

## 2012-09-02 VITALS — BP 124/80 | Ht 64.0 in | Wt 230.0 lb

## 2012-09-02 DIAGNOSIS — N926 Irregular menstruation, unspecified: Secondary | ICD-10-CM

## 2012-09-02 DIAGNOSIS — Z01419 Encounter for gynecological examination (general) (routine) without abnormal findings: Secondary | ICD-10-CM | POA: Insufficient documentation

## 2012-09-02 DIAGNOSIS — R6889 Other general symptoms and signs: Secondary | ICD-10-CM

## 2012-09-02 DIAGNOSIS — IMO0002 Reserved for concepts with insufficient information to code with codable children: Secondary | ICD-10-CM

## 2012-09-02 DIAGNOSIS — Z13220 Encounter for screening for lipoid disorders: Secondary | ICD-10-CM

## 2012-09-02 DIAGNOSIS — Z1151 Encounter for screening for human papillomavirus (HPV): Secondary | ICD-10-CM | POA: Insufficient documentation

## 2012-09-02 DIAGNOSIS — R87810 Cervical high risk human papillomavirus (HPV) DNA test positive: Secondary | ICD-10-CM | POA: Insufficient documentation

## 2012-09-02 LAB — LIPID PANEL
HDL: 42 mg/dL (ref >39)
LDL Cholesterol: 177 mg/dL — ABNORMAL HIGH (ref 0–99)
Total CHOL/HDL Ratio: 5.8 Ratio
Triglycerides: 123 mg/dL (ref <150)

## 2012-09-02 LAB — CBC WITH DIFFERENTIAL/PLATELET
Basophils Absolute: 0 10*3/uL (ref 0.0–0.1)
Basophils Relative: 0 % (ref 0–1)
Eosinophils Absolute: 0.1 10*3/uL (ref 0.0–0.7)
Eosinophils Relative: 2 % (ref 0–5)
HCT: 36.1 % (ref 36.0–46.0)
MCH: 29 pg (ref 26.0–34.0)
MCHC: 32.4 g/dL (ref 30.0–36.0)
Monocytes Absolute: 0.4 10*3/uL (ref 0.1–1.0)
Monocytes Relative: 10 % (ref 3–12)
Neutro Abs: 2.8 10*3/uL (ref 1.7–7.7)
RDW: 13.8 % (ref 11.5–15.5)

## 2012-09-02 LAB — COMPREHENSIVE METABOLIC PANEL
AST: 17 U/L (ref 0–37)
Alkaline Phosphatase: 81 U/L (ref 39–117)
BUN: 7 mg/dL (ref 6–23)
Creat: 0.75 mg/dL (ref 0.50–1.10)
Glucose, Bld: 83 mg/dL (ref 70–99)
Potassium: 4.1 mEq/L (ref 3.5–5.3)
Total Bilirubin: 0.3 mg/dL (ref 0.3–1.2)

## 2012-09-02 NOTE — Progress Notes (Signed)
Ann Oconnor [birth year] [id number]        37 y.o.  G0P0 for annual exam.  Several issues noted below.  Past medical history,surgical history, medications, allergies, family history and social history were all reviewed and documented in the EPIC chart.  ROS:  Performed and pertinent positives and negatives are included in the history, assessment and plan .  Exam: Kim assistant Filed Vitals:   09/02/12 1024  BP: 124/80  Height: 5\' 4"  (1.626 m)  Weight: 230 lb (104.327 kg)   General appearance  Normal Skin grossly normal Head/Neck normal with no cervical or supraclavicular adenopathy thyroid normal Lungs  clear Cardiac RR, without RMG Abdominal  soft, nontender, without masses, organomegaly or hernia Breasts  examined lying and sitting without masses, retractions, discharge or axillary adenopathy. Pelvic  Ext/BUS/vagina  normal   Cervix  normal Pap/HPV  Uterus  anteverted, normal size, shape and contour, midline and mobile nontender   Adnexa  Without masses or tenderness    Anus and perineum  normal   Rectovaginal  normal sphincter tone without palpated masses or tenderness.    Assessment/Plan:  37 y.o. G0P0 female for annual exam.   1. Mild irregular menses. Patient having monthly menses this past year until this past month when she started early in a two-week interval. Lab work last year due to amenorrhea had a normal TSH FSH and prolactin. Recommend keep menstrual calendar now and if resumes monthly regular menses we'll follow. If irregularity continues she'll represent for further evaluation. 2. Contraceptive management. Patient not sexually active and not using anything for contraception. Declined contraception at this time. We'll followup if becomes sexually active and wants to discuss contraception. 3. History LGSIL.  She has a history of cone biopsy for CIN 2 in 2008. She had CIN-1 on colposcopic biopsy with negative ECC 08/2010 at another practice. Her Pap smear 2013 showed  ASCUS with positive high-risk HPV and followup colposcopic biopsy showed LGSIL with negative ECC. Pap/HPV done today. Followup for results. 4. Prior upper vaginal cyst. Exam today is normal with resolution of the prior cyst. Plan annual reexamination. 5. Mammography. Baseline screening mammographic recommendations between 35 and 40 were reviewed. Patient has no strong family history and prefers to wait closer to 40. SBE monthly reviewed. 6. Health maintenance. CBC comprehensive metabolic panel lipid profile urinalysis ordered. Followup one year, sooner as needed.    Dara Lords MD, 11:09 AM 09/02/2012

## 2012-09-02 NOTE — Patient Instructions (Signed)
Keep menstrual calendar. If regular menses and will monitor. If irregular than followup for further evaluation. Followup if contraception becomes an issue. Followup in one year for annual exam.

## 2012-09-03 LAB — URINALYSIS W MICROSCOPIC + REFLEX CULTURE
Casts: NONE SEEN
Hgb urine dipstick: NEGATIVE
Ketones, ur: NEGATIVE mg/dL
Nitrite: NEGATIVE
Protein, ur: NEGATIVE mg/dL
pH: 6.5 (ref 5.0–8.0)

## 2012-09-04 ENCOUNTER — Other Ambulatory Visit: Payer: Self-pay | Admitting: Gynecology

## 2012-09-04 LAB — URINE CULTURE: Colony Count: >=100000

## 2012-09-04 MED ORDER — SULFAMETHOXAZOLE-TRIMETHOPRIM 800-160 MG PO TABS: 1.0000 | ORAL_TABLET | Freq: Two times a day (BID) | ORAL | Status: AC

## 2012-09-15 ENCOUNTER — Other Ambulatory Visit: Payer: Self-pay | Admitting: Neurology

## 2012-09-29 ENCOUNTER — Ambulatory Visit: Payer: BC Managed Care – PPO | Admitting: Gynecology

## 2012-10-02 ENCOUNTER — Ambulatory Visit (INDEPENDENT_AMBULATORY_CARE_PROVIDER_SITE_OTHER): Payer: BC Managed Care – PPO | Admitting: Gynecology

## 2012-10-02 ENCOUNTER — Encounter: Payer: Self-pay | Admitting: Gynecology

## 2012-10-02 DIAGNOSIS — R6889 Other general symptoms and signs: Secondary | ICD-10-CM

## 2012-10-02 DIAGNOSIS — R87810 Cervical high risk human papillomavirus (HPV) DNA test positive: Secondary | ICD-10-CM

## 2012-10-02 DIAGNOSIS — IMO0002 Reserved for concepts with insufficient information to code with codable children: Secondary | ICD-10-CM

## 2012-10-02 NOTE — Patient Instructions (Signed)
Office will call you with biopsy results 

## 2012-10-02 NOTE — Addendum Note (Signed)
Addended by: Dayna Barker on: 10/02/2012 02:24 PM   Modules accepted: Orders

## 2012-10-02 NOTE — Progress Notes (Addendum)
Patient ID: EUFELIA VENO, female   DOB: 11-20-1975, 37 y.o.   MRN: [id number] Patient presents for colposcopy with a history of cone biopsy for CIN 2 in 2008. She had CIN-1 on colposcopic biopsy with negative ECC 08/2010 at another practice. Her Pap smear 2013 showed ASCUS with positive high-risk HPV and followup colposcopic biopsy showed LGSIL with negative ECC. Most recent Pap/HPV showed LGSIL with positive high-risk HPV.  Exam with Selena Batten External BUS vagina normal. Cervix grossly normal.  Colposcopy after acetic acid cleanse adequate showing mild cervical scarring but no abnormalities. ECC performed. Physical Exam  Genitourinary:      Assessment and plan: Persistent low-grade SIL with positive high-risk HPV. Colposcopy today was adequate normal with some puckering and scarring from her cone biopsy. ECC performed. We'll triage based on results. Issues of persistent dysplasia with positive high-risk HPV reviewed. Possibilities for progression/regression discussed. Assuming ECC negative and plan expected management. Otherwise then we'll discuss treatment options.

## 2012-10-15 ENCOUNTER — Other Ambulatory Visit: Payer: Self-pay | Admitting: *Deleted

## 2012-10-15 MED ORDER — GABAPENTIN 400 MG PO CAPS: 400.0000 mg | ORAL_CAPSULE | Freq: Two times a day (BID) | ORAL | Status: DC

## 2013-02-05 ENCOUNTER — Other Ambulatory Visit: Payer: Self-pay | Admitting: Neurology

## 2013-02-05 ENCOUNTER — Telehealth: Payer: Self-pay | Admitting: *Deleted

## 2013-02-05 DIAGNOSIS — Z139 Encounter for screening, unspecified: Secondary | ICD-10-CM

## 2013-02-05 NOTE — Telephone Encounter (Signed)
Pt has a bio-metric form that must be completed for her insurance. She will needed a recheck of cholesterol, glucose. Pt had this done in July at annual but result must be done after Oct. 30 th. Okay for labs to be done?

## 2013-02-05 NOTE — Telephone Encounter (Signed)
Okay for fasting lipid profile and glucose. If there are other labs she needs they can be added to this.

## 2013-02-06 ENCOUNTER — Other Ambulatory Visit: Payer: BC Managed Care – PPO

## 2013-02-06 NOTE — Telephone Encounter (Signed)
Left the below on pt voicemail, orders placed, pt said she would like to have height and weight and B/P checked as well.

## 2013-02-09 ENCOUNTER — Other Ambulatory Visit: Payer: BC Managed Care – PPO

## 2013-02-09 DIAGNOSIS — Z139 Encounter for screening, unspecified: Secondary | ICD-10-CM

## 2013-02-09 LAB — GLUCOSE, RANDOM: Glucose, Bld: 88 mg/dL (ref 70–99)

## 2013-02-09 LAB — LIPID PANEL
Cholesterol: 217 mg/dL — ABNORMAL HIGH (ref 0–200)
Total CHOL/HDL Ratio: 4.8 Ratio

## 2013-02-10 ENCOUNTER — Other Ambulatory Visit: Payer: Self-pay | Admitting: Diagnostic Neuroimaging

## 2013-03-05 ENCOUNTER — Other Ambulatory Visit: Payer: Self-pay | Admitting: Neurology

## 2013-04-02 ENCOUNTER — Other Ambulatory Visit: Payer: Self-pay | Admitting: Neurology

## 2013-04-30 ENCOUNTER — Other Ambulatory Visit: Payer: Self-pay | Admitting: Neurology

## 2014-04-02 ENCOUNTER — Encounter: Payer: BC Managed Care – PPO | Admitting: Gynecology

## 2014-04-21 ENCOUNTER — Encounter (HOSPITAL_COMMUNITY): Payer: Self-pay | Admitting: *Deleted

## 2014-04-21 ENCOUNTER — Emergency Department (HOSPITAL_COMMUNITY)
Admission: EM | Admit: 2014-04-21 | Discharge: 2014-04-21 | Disposition: A | Discharge status: Home / Self Care | Payer: BLUE CROSS/BLUE SHIELD | Attending: Emergency Medicine | Admitting: Emergency Medicine

## 2014-04-21 DIAGNOSIS — Z79899 Other long term (current) drug therapy: Secondary | ICD-10-CM | POA: Diagnosis not present

## 2014-04-21 DIAGNOSIS — G35 Multiple sclerosis: Secondary | ICD-10-CM

## 2014-04-21 DIAGNOSIS — R531 Weakness: Secondary | ICD-10-CM | POA: Diagnosis present

## 2014-04-21 LAB — URINE MICROSCOPIC-ADD ON

## 2014-04-21 LAB — BASIC METABOLIC PANEL
Anion gap: 10 (ref 5–15)
BUN: 8 mg/dL (ref 6–23)
CO2: 22 mmol/L (ref 19–32)
Calcium: 9 mg/dL (ref 8.4–10.5)
Chloride: 110 mmol/L (ref 96–112)
Creatinine, Ser: 0.5 mg/dL (ref 0.50–1.10)
GFR calc Af Amer: >90 mL/min (ref >90)
GFR calc non Af Amer: >90 mL/min (ref >90)
Glucose, Bld: 91 mg/dL (ref 70–99)
Potassium: 3.6 mmol/L (ref 3.5–5.1)
Sodium: 142 mmol/L (ref 135–145)

## 2014-04-21 LAB — URINALYSIS, ROUTINE W REFLEX MICROSCOPIC
Bilirubin Urine: NEGATIVE
Glucose, UA: NEGATIVE mg/dL
Ketones, ur: >80 mg/dL — AB
Leukocytes, UA: NEGATIVE
Nitrite: NEGATIVE
Protein, ur: NEGATIVE mg/dL
Specific Gravity, Urine: 1.02 (ref 1.005–1.030)
Urobilinogen, UA: 1 mg/dL (ref 0.0–1.0)
pH: 5.5 (ref 5.0–8.0)

## 2014-04-21 LAB — CBC WITH DIFFERENTIAL/PLATELET
Basophils Absolute: 0 10*3/uL (ref 0.0–0.1)
Basophils Relative: 0 % (ref 0–1)
Eosinophils Absolute: 0.1 10*3/uL (ref 0.0–0.7)
Eosinophils Relative: 1 % (ref 0–5)
HCT: 32.1 % — ABNORMAL LOW (ref 36.0–46.0)
Hemoglobin: 10.6 g/dL — ABNORMAL LOW (ref 12.0–15.0)
Lymphocytes Relative: 20 % (ref 12–46)
Lymphs Abs: 1.4 10*3/uL (ref 0.7–4.0)
MCH: 31.1 pg (ref 26.0–34.0)
MCHC: 33 g/dL (ref 30.0–36.0)
MCV: 94.1 fL (ref 78.0–100.0)
Monocytes Absolute: 0.6 10*3/uL (ref 0.1–1.0)
Monocytes Relative: 8 % (ref 3–12)
Neutro Abs: 4.9 10*3/uL (ref 1.7–7.7)
Neutrophils Relative %: 71 % (ref 43–77)
Platelets: 331 10*3/uL (ref 150–400)
RBC: 3.41 MIL/uL — ABNORMAL LOW (ref 3.87–5.11)
RDW: 16.2 % — ABNORMAL HIGH (ref 11.5–15.5)
WBC: 7 10*3/uL (ref 4.0–10.5)

## 2014-04-21 MED ORDER — SODIUM CHLORIDE 0.9 % IV BOLUS (SEPSIS)
1000.0000 mL | Freq: Once | INTRAVENOUS | Status: AC
  Administered 2014-04-21: 1000 mL via INTRAVENOUS

## 2014-04-21 NOTE — ED Notes (Signed)
Pt arrives via ems. Pt has history of MS. Pt was just d/c yesterday after 11 days from [facility] for the same, had plasma exchange completed yesterday. Sent home. Pt has had multiple falls since coming home, describes progressive weakness in bilateral lower extremities

## 2014-04-21 NOTE — ED Provider Notes (Signed)
CSN: [id number]     Arrival date & time 04/21/14  1524 History   First MD Initiated Contact with Patient 04/21/14 1609     Chief Complaint  Patient presents with  . Weakness     (Consider location/radiation/quality/duration/timing/severity/associated sxs/prior Treatment) HPI  Patient presents to the emergency department with lower for any weakness has been ongoing for several months.  The patient has MS and has a worsening in her condition.  She recently spent 12 days at [facility].  She was discharged with outpatient therapy.  From there, but states that she has been having weakness in her legs and falling at home and unable to take care of herself.  Patient denies chest pain, shortness of breath, headache, blurred vision, incontinence, dysuria, abdominal pain, nausea, vomiting, diarrhea Past Medical History  Diagnosis Date  . MS (multiple sclerosis) 2002  . LGSIL (low grade squamous intraepithelial dysplasia)    Past Surgical History  Procedure Laterality Date  . Cervical cone biopsy  2008    CIN 2   Family History  Problem Relation Age of Onset  . Thyroid disease Mother   . Thyroid disease Sister   . Cancer Paternal Aunt     not sure what kind, maybe cervix  . Thyroid disease Sister    History  Substance Use Topics  . Smoking status: Never Smoker   . Smokeless tobacco: Never Used  . Alcohol Use: No     Comment: Rare   OB History    Gravida Para Term Preterm AB TAB SAB Ectopic Multiple Living   0              Review of Systems  All other systems negative except as documented in the HPI. All pertinent positives and negatives as reviewed in the HPI.  Allergies  Review of patient's allergies indicates no known allergies.  Home Medications   Prior to Admission medications   Medication Sig Start Date End Date Taking? Authorizing Provider  clonazePAM (KLONOPIN) 0.5 MG tablet TAKE 1 TABLET BY MOUTH EVERY NIGHT AT BEDTIME 07/20/12  Yes Marcial Pacas, MD   Dimethyl Fumarate 240 MG CPDR Take 1 capsule by mouth 2 (two) times daily.   Yes Historical Provider, MD  gabapentin (NEURONTIN) 400 MG capsule Take 1 capsule (400 mg total) by mouth 2 (two) times daily. 10/15/12  Yes Marcial Pacas, MD  PARoxetine (PAXIL-CR) 37.5 MG 24 hr tablet TAKE 1 TABLET BY MOUTH DAILY   Yes Marcial Pacas, MD   BP 112/69 mmHg  Pulse 97  Temp(Src) 98.2 F (36.8 C) (Oral)  Resp 20  Ht 5\' 4"  (1.626 m)  Wt 245 lb (111.131 kg)  BMI 42.03 kg/m2  SpO2 99%  LMP 04/18/2014 Physical Exam  Constitutional: She is oriented to person, place, and time. She appears well-developed and well-nourished. No distress.  HENT:  Head: Normocephalic and atraumatic.  Mouth/Throat: Oropharynx is clear and moist.  Eyes: Pupils are equal, round, and reactive to light.  Neck: Normal range of motion. Neck supple.  Cardiovascular: Normal rate, regular rhythm and normal heart sounds.  Exam reveals no gallop and no friction rub.   No murmur heard. Pulmonary/Chest: Effort normal and breath sounds normal. No respiratory distress.  Neurological: She is alert and oriented to person, place, and time. She displays normal reflexes. No cranial nerve deficit. She exhibits normal muscle tone. Coordination normal.  Skin: Skin is warm and dry. No rash noted. No erythema.  Nursing note and vitals reviewed.   ED  Course  Procedures (including critical care time) Labs Review Labs Reviewed  CBC WITH DIFFERENTIAL/PLATELET - Abnormal; Notable for the following:    RBC 3.41 (*)    Hemoglobin 10.6 (*)    HCT 32.1 (*)    RDW 16.2 (*)    All other components within normal limits  URINALYSIS, ROUTINE W REFLEX MICROSCOPIC - Abnormal; Notable for the following:    APPearance CLOUDY (*)    Hgb urine dipstick LARGE (*)    Ketones, ur >80 (*)    All other components within normal limits  URINE MICROSCOPIC-ADD ON - Abnormal; Notable for the following:    Bacteria, UA FEW (*)    All other components within normal limits   BASIC METABOLIC PANEL    Imaging Review No results found.   EKG Interpretation   Date/Time:  Wednesday April 21 2014 18:07:29 EST Ventricular Rate:  56 PR Interval:  178 QRS Duration: 119 QT Interval:  483 QTC Calculation: 466 R Axis:   6 Text Interpretation:  Sinus rhythm Incomplete right bundle branch block  Confirmed by Ashok Cordia  MD, Lennette Bihari ([id number]) on 04/21/2014 6:14:17 PM      MDM   Final diagnoses:  None    I spoke with social work and case Freight forwarder who will work on outpatient placement for rehabilitation facility.  Patient is advised of the plan and all questions were answered.  He is also advised follow-up with her doctors at [facility], PA-C 04/21/14 2040  Mirna Mires, MD 04/24/14 [phone]

## 2014-04-21 NOTE — ED Notes (Signed)
Pt stood up at the bedside without assist. Pt with full weightbearing on extremities without difficulty, denied dizziness with changing positions. Pt took one step forward and said her legs were giving out and she needed to sit down. Pt was then able to stand back up without assist. Pt able to take 5 steps forward in the room, full weight bearing, stand by assist, before she said she felt her legs were going to give out and she needed to sit back down, taking steps backwards back into the bed.

## 2014-04-21 NOTE — ED Notes (Signed)
Bed: NP00 Expected date:  Expected time:  Means of arrival:  Comments: EMS-MS exaserbation

## 2014-04-21 NOTE — ED Notes (Signed)
Waiting to speak with social work/case management before leaving.

## 2014-04-21 NOTE — Progress Notes (Signed)
Home health orders faxed to [facility] at 2157pm with confrimation of receipt at 2159pm.

## 2014-04-21 NOTE — Discharge Instructions (Signed)
Return here as needed.  Follow-up with the resources provided. °

## 2014-04-21 NOTE — Progress Notes (Signed)
  CARE MANAGEMENT ED NOTE 04/21/2014  Patient:  Ann Oconnor, Ann Oconnor   Account Number:  [id number]  Date Initiated:  04/21/2014  Documentation initiated by:  Livia Snellen  Subjective/Objective Assessment:   Patient presents to Ed with multiple falls at home since discharge from [place] post 11 day stay.     Subjective/Objective Assessment Detail:   Patient with pmhx of MS.     Action/Plan:   Action/Plan Detail:   Anticipated DC Date:  04/21/2014     Status Recommendation to Physician:   Result of Recommendation:    Other ED Services  Consult Working Plan   In-house referral  Clinical Social Worker   DC Forensic scientist  CM consult  Other   [facility] Choice  HOME HEALTH   Choice offered to / List presented to:  C-1 Patient     [place] arranged  HH-1 RN  [place]      [place].    Status of service:  Completed, signed off  ED Comments:   ED Comments Detail:  [facility] consulted to speak to patient reagrding homehealth services.  EDCM spoke to patient and her mother at bedside. Patient lives at home with her parents.  Patient reports they live in a "single wide trailer."  Patient has a walker, rolator, BSC, transport chair and scooter at home. Patient is without home health services at this time. Patient voiced wanting to go to [facility].  [facility] consulted EDSW.  Patient voiced concerns about falls at home and difficulty getting to the bathroom.  EDCM explained home health services to patient. EDCM explained with home health, the patient may receive a visiting RN, PT, Ot, aide and social worker if needed for placement. EDCM also provided patient with list of private duty nursing agencies and informed patient it would be an out of pocket expense for her.  Patient verbalized understanding. EDCM advised patient to keep her equipment close to her such as her walker and bedside commode, rest when needed. Have a chair  close to her or use scooter if legs feeling weak that day so that she may sit safely.  EDCM advised patient to keep floor clear of objects, throw rugs etc. to prevent falls.  EDCM informed patient and her mother that patient's pcp and home health social worker can assist with placement from home if needed.  Per EDRN note, patient able to stand without assistance full weight bearing, and took a few steps and then needed to sit after legs feeling weak, but then was able to stand again without assistance full weight bearing.    [facility] informed patient of this information.  EDCM asked patient, Do you feel okay about going home? Patient stated, "Yes".  EDCM provided patient with list of home health agencies in [facility] of whiich [facility] was chosen since patient stated, "i have an account with them."  [facility] discussed patient with EDSW, EDP, EDPA.  Orders placed for home health RN, PT, OT, aide and social worker.  [facility] requested on referral to [facility] to please contact patient ASAP.  Patient and patient's mother thankful for services.  No further EDCM needs at this time.

## 2014-04-21 NOTE — Progress Notes (Signed)
39 yr old [facility] [place] pt d/c from [place] on 04/21/14 and had plasma exchange completed yesterday. Sent home. Pt has had multiple falls since coming home, describes progressive weakness in bilateral lower extremities.  ED CMs consulted by EDP, Ashok Cordia, and ED PA/NP C Lawyer [facility] ED called to [facility] place to [place] at [phone] to inquired if available bed for pt Left a voice message for Angela Nevin, admission coordinator to return a call to ED SW if there is an available bed for pt Pending return call  Note in care everywhere indicate pt called at 1250 pm 04/21/14 to Dr Doy Mince, [place] provider, office to request placement in rehab states no walking and parents unable to help her to bathroom.  No home health setup.  [facility] provider [facility]" to get to rehab.

## 2014-04-22 NOTE — Progress Notes (Signed)
CSW was notified by Nurse CM that pt presents to [facility] due to falling at home.   CSW reached out to [facility], which is where the pt wanted to be admitted. CSW spoke with [place]. Maz[place] states that she has never given the pt a bed offer. She informed CSW that she spoke with mom and informed her that she would need to fax her discharge papers that she received from [facility] in order to be considered. However, she states that mom did not fax her the requested documents. Pt was informed of conversation with [place].  Pt voiced to CSW that she was ready to go home. Pt states that she can feed herself independently, and can sometimes walk well but at other times needs assistance. She says that she does have equipment. Mom states the pt has good upper body movement.  CSW met with pt at bedside. Mom was present. Nurse CM was present at bedside, who has ordered home health for the pt. Per note. Pt will receive RN,pt,OT, aide and SW.   CSW asked pt if she and mother had any questions. However, they do not have any at this time.  Willette Brace [phone] ED CSW 04/22/2014 12:34 AM

## 2014-04-22 NOTE — Progress Notes (Signed)
EDCM spoke to [place] of [facility] at 1713pm who confirms referral for home health services was received and patient is scheduled to be seen tomorrow for services.  EDCM called patient at this time to inform her that [facility] will be contacting her tomorrow for services.  [facility] asked patient how she was doing?  Patient stated, "I'm the same."  Patient thanked [facility] for services.  No further EDCM needs at this time.

## 2014-04-28 ENCOUNTER — Encounter (HOSPITAL_COMMUNITY): Payer: Self-pay | Admitting: Family Medicine

## 2014-04-28 ENCOUNTER — Observation Stay (HOSPITAL_COMMUNITY): Payer: BLUE CROSS/BLUE SHIELD

## 2014-04-28 ENCOUNTER — Inpatient Hospital Stay (HOSPITAL_COMMUNITY)
Admission: EM | Admit: 2014-04-28 | Discharge: 2014-05-03 | DRG: 059 | Disposition: A | Discharge status: Skilled Nursing Facility | Payer: BLUE CROSS/BLUE SHIELD | Attending: Internal Medicine | Admitting: Internal Medicine

## 2014-04-28 DIAGNOSIS — M6282 Rhabdomyolysis: Secondary | ICD-10-CM | POA: Diagnosis present

## 2014-04-28 DIAGNOSIS — K5900 Constipation, unspecified: Secondary | ICD-10-CM | POA: Diagnosis present

## 2014-04-28 DIAGNOSIS — Z79899 Other long term (current) drug therapy: Secondary | ICD-10-CM

## 2014-04-28 DIAGNOSIS — R296 Repeated falls: Secondary | ICD-10-CM | POA: Diagnosis present

## 2014-04-28 DIAGNOSIS — G35 Multiple sclerosis: Principal | ICD-10-CM | POA: Diagnosis present

## 2014-04-28 DIAGNOSIS — Z87410 Personal history of cervical dysplasia: Secondary | ICD-10-CM

## 2014-04-28 DIAGNOSIS — R112 Nausea with vomiting, unspecified: Secondary | ICD-10-CM | POA: Diagnosis present

## 2014-04-28 DIAGNOSIS — G8929 Other chronic pain: Secondary | ICD-10-CM | POA: Diagnosis present

## 2014-04-28 DIAGNOSIS — R200 Anesthesia of skin: Secondary | ICD-10-CM | POA: Diagnosis present

## 2014-04-28 DIAGNOSIS — F329 Major depressive disorder, single episode, unspecified: Secondary | ICD-10-CM | POA: Diagnosis present

## 2014-04-28 DIAGNOSIS — R29898 Other symptoms and signs involving the musculoskeletal system: Secondary | ICD-10-CM | POA: Diagnosis present

## 2014-04-28 DIAGNOSIS — Z6841 Body Mass Index (BMI) 40.0 and over, adult: Secondary | ICD-10-CM

## 2014-04-28 DIAGNOSIS — I10 Essential (primary) hypertension: Secondary | ICD-10-CM | POA: Diagnosis present

## 2014-04-28 DIAGNOSIS — E860 Dehydration: Secondary | ICD-10-CM | POA: Diagnosis present

## 2014-04-28 LAB — LIPASE, BLOOD: Lipase: 22 U/L (ref 11–59)

## 2014-04-28 LAB — URINALYSIS, ROUTINE W REFLEX MICROSCOPIC
BILIRUBIN URINE: NEGATIVE
GLUCOSE, UA: NEGATIVE mg/dL
HGB URINE DIPSTICK: NEGATIVE
Ketones, ur: >80 mg/dL — AB
LEUKOCYTES UA: NEGATIVE
Nitrite: NEGATIVE
Protein, ur: NEGATIVE mg/dL
Specific Gravity, Urine: 1.025 (ref 1.005–1.030)
Urobilinogen, UA: 0.2 mg/dL (ref 0.0–1.0)
pH: 6 (ref 5.0–8.0)

## 2014-04-28 LAB — COMPREHENSIVE METABOLIC PANEL
ALT: 25 U/L (ref 0–35)
ANION GAP: 11 (ref 5–15)
AST: 46 U/L — ABNORMAL HIGH (ref 0–37)
Albumin: 5.6 g/dL — ABNORMAL HIGH (ref 3.5–5.2)
Alkaline Phosphatase: 43 U/L (ref 39–117)
BILIRUBIN TOTAL: 1.1 mg/dL (ref 0.3–1.2)
BUN: 8 mg/dL (ref 6–23)
CHLORIDE: 102 mmol/L (ref 96–112)
CO2: 22 mmol/L (ref 19–32)
CREATININE: 0.53 mg/dL (ref 0.50–1.10)
Calcium: 9.4 mg/dL (ref 8.4–10.5)
GFR calc Af Amer: >90 mL/min (ref >90)
GFR calc non Af Amer: >90 mL/min (ref >90)
Glucose, Bld: 101 mg/dL — ABNORMAL HIGH (ref 70–99)
Potassium: 4.1 mmol/L (ref 3.5–5.1)
Sodium: 135 mmol/L (ref 135–145)
TOTAL PROTEIN: 7.8 g/dL (ref 6.0–8.3)

## 2014-04-28 LAB — GLUCOSE, CAPILLARY
GLUCOSE-CAPILLARY: 87 mg/dL (ref 70–99)
Glucose-Capillary: 124 mg/dL — ABNORMAL HIGH (ref 70–99)

## 2014-04-28 LAB — CBC WITH DIFFERENTIAL/PLATELET
BASOS ABS: 0 10*3/uL (ref 0.0–0.1)
BASOS PCT: 0 % (ref 0–1)
Eosinophils Absolute: 0.1 10*3/uL (ref 0.0–0.7)
Eosinophils Relative: 1 % (ref 0–5)
HEMATOCRIT: 36.5 % (ref 36.0–46.0)
Hemoglobin: 12 g/dL (ref 12.0–15.0)
LYMPHS PCT: 14 % (ref 12–46)
Lymphs Abs: 1.2 10*3/uL (ref 0.7–4.0)
MCH: 31.1 pg (ref 26.0–34.0)
MCHC: 32.9 g/dL (ref 30.0–36.0)
MCV: 94.6 fL (ref 78.0–100.0)
Monocytes Absolute: 0.8 10*3/uL (ref 0.1–1.0)
Monocytes Relative: 10 % (ref 3–12)
NEUTROS ABS: 6.3 10*3/uL (ref 1.7–7.7)
Neutrophils Relative %: 75 % (ref 43–77)
PLATELETS: 524 10*3/uL — AB (ref 150–400)
RBC: 3.86 MIL/uL — ABNORMAL LOW (ref 3.87–5.11)
RDW: 16.2 % — ABNORMAL HIGH (ref 11.5–15.5)
WBC: 8.4 10*3/uL (ref 4.0–10.5)

## 2014-04-28 LAB — CK: Total CK: 815 U/L — ABNORMAL HIGH (ref 7–177)

## 2014-04-28 LAB — PREGNANCY, URINE: Preg Test, Ur: NEGATIVE

## 2014-04-28 MED ORDER — SODIUM CHLORIDE 0.9 % IV SOLN
500.0000 mg | Freq: Two times a day (BID) | INTRAVENOUS | Status: AC
  Administered 2014-04-28 – 2014-05-02 (×10): 500 mg via INTRAVENOUS
  Filled 2014-04-28 (×11): qty 4

## 2014-04-28 MED ORDER — IRBESARTAN 75 MG PO TABS
75.0000 mg | ORAL_TABLET | Freq: Every day | ORAL | Status: DC
  Administered 2014-04-29 – 2014-05-03 (×5): 75 mg via ORAL
  Filled 2014-04-28 (×5): qty 1

## 2014-04-28 MED ORDER — CLONAZEPAM 0.5 MG PO TABS
0.5000 mg | ORAL_TABLET | Freq: Every day | ORAL | Status: DC
  Administered 2014-04-28 – 2014-05-02 (×5): 0.5 mg via ORAL
  Filled 2014-04-28 (×5): qty 1

## 2014-04-28 MED ORDER — PANTOPRAZOLE SODIUM 40 MG IV SOLR
40.0000 mg | Freq: Every day | INTRAVENOUS | Status: DC
  Administered 2014-04-28 – 2014-04-29 (×2): 40 mg via INTRAVENOUS
  Filled 2014-04-28 (×2): qty 40

## 2014-04-28 MED ORDER — FAMOTIDINE 20 MG PO TABS
20.0000 mg | ORAL_TABLET | Freq: Every day | ORAL | Status: DC
  Administered 2014-04-28 – 2014-05-03 (×6): 20 mg via ORAL
  Filled 2014-04-28 (×6): qty 1

## 2014-04-28 MED ORDER — ACETAMINOPHEN 650 MG RE SUPP: 650.0000 mg | Freq: Four times a day (QID) | RECTAL | Status: DC | PRN

## 2014-04-28 MED ORDER — SENNA 8.6 MG PO TABS
2.0000 | ORAL_TABLET | Freq: Every day | ORAL | Status: DC
  Administered 2014-04-28 – 2014-05-03 (×6): 17.2 mg via ORAL
  Filled 2014-04-28 (×5): qty 2

## 2014-04-28 MED ORDER — POLYETHYLENE GLYCOL 3350 17 G PO PACK: 17.0000 g | PACK | Freq: Every day | ORAL | Status: DC | PRN

## 2014-04-28 MED ORDER — HYDROMORPHONE HCL 1 MG/ML IJ SOLN
0.5000 mg | INTRAMUSCULAR | Status: DC | PRN
  Administered 2014-04-28 – 2014-05-03 (×20): 0.5 mg via INTRAVENOUS
  Filled 2014-04-28 (×21): qty 1

## 2014-04-28 MED ORDER — ALBUTEROL SULFATE (2.5 MG/3ML) 0.083% IN NEBU: 2.5000 mg | INHALATION_SOLUTION | RESPIRATORY_TRACT | Status: DC | PRN

## 2014-04-28 MED ORDER — PAROXETINE HCL ER 37.5 MG PO TB24
37.5000 mg | ORAL_TABLET | Freq: Every day | ORAL | Status: DC
  Administered 2014-04-28 – 2014-05-03 (×6): 37.5 mg via ORAL
  Filled 2014-04-28 (×6): qty 1

## 2014-04-28 MED ORDER — SODIUM CHLORIDE 0.9 % IV BOLUS (SEPSIS)
1000.0000 mL | Freq: Once | INTRAVENOUS | Status: AC
  Administered 2014-04-28: 1000 mL via INTRAVENOUS

## 2014-04-28 MED ORDER — ONDANSETRON HCL 4 MG/2ML IJ SOLN
4.0000 mg | Freq: Once | INTRAMUSCULAR | Status: AC
  Administered 2014-04-28: 4 mg via INTRAVENOUS
  Filled 2014-04-28: qty 2

## 2014-04-28 MED ORDER — ACETAMINOPHEN 325 MG PO TABS: 650.0000 mg | ORAL_TABLET | Freq: Four times a day (QID) | ORAL | Status: DC | PRN

## 2014-04-28 MED ORDER — DIMETHYL FUMARATE 240 MG PO CPDR
1.0000 | DELAYED_RELEASE_CAPSULE | Freq: Two times a day (BID) | ORAL | Status: DC
  Administered 2014-04-28: 240 mg via ORAL

## 2014-04-28 MED ORDER — GABAPENTIN 400 MG PO CAPS
400.0000 mg | ORAL_CAPSULE | Freq: Two times a day (BID) | ORAL | Status: DC
  Administered 2014-04-28 – 2014-05-03 (×11): 400 mg via ORAL
  Filled 2014-04-28 (×13): qty 1

## 2014-04-28 MED ORDER — ENOXAPARIN SODIUM 40 MG/0.4ML ~~LOC~~ SOLN
40.0000 mg | Freq: Every day | SUBCUTANEOUS | Status: DC
  Administered 2014-04-28 – 2014-05-03 (×6): 40 mg via SUBCUTANEOUS
  Filled 2014-04-28 (×6): qty 0.4

## 2014-04-28 MED ORDER — BISACODYL 10 MG RE SUPP
10.0000 mg | Freq: Every day | RECTAL | Status: DC | PRN
  Administered 2014-05-01: 10 mg via RECTAL
  Filled 2014-04-28: qty 1

## 2014-04-28 MED ORDER — ONDANSETRON HCL 4 MG/2ML IJ SOLN: 4.0000 mg | Freq: Four times a day (QID) | INTRAMUSCULAR | Status: DC | PRN

## 2014-04-28 MED ORDER — ONDANSETRON HCL 4 MG PO TABS: 4.0000 mg | ORAL_TABLET | Freq: Four times a day (QID) | ORAL | Status: DC | PRN

## 2014-04-28 MED ORDER — HYDROCODONE-ACETAMINOPHEN 5-325 MG PO TABS
1.0000 | ORAL_TABLET | ORAL | Status: DC | PRN
  Administered 2014-04-28 – 2014-05-02 (×11): 2 via ORAL
  Filled 2014-04-28 (×11): qty 2

## 2014-04-28 MED ORDER — HYDROMORPHONE HCL 1 MG/ML IJ SOLN
1.0000 mg | Freq: Once | INTRAMUSCULAR | Status: AC
  Administered 2014-04-28: 1 mg via INTRAVENOUS
  Filled 2014-04-28: qty 1

## 2014-04-28 MED ORDER — SODIUM CHLORIDE 0.9 % IV SOLN
INTRAVENOUS | Status: DC
  Administered 2014-04-28 – 2014-04-29 (×3): via INTRAVENOUS

## 2014-04-28 NOTE — ED Notes (Signed)
Bed: IP77 Expected date:  Expected time:  Means of arrival:  Comments: EMS n/v ABD PAIN

## 2014-04-28 NOTE — Progress Notes (Signed)
PT Cancellation Note  Patient Details Name: Ann Oconnor MRN: [id number] DOB: 03-Sep-1975   Cancelled Treatment:    Reason Eval/Treat Not Completed: Medical issues which prohibited therapy;Patient not medically ready, just admitted today, to begin steroids, will check in AM.   [name] 04/28/2014, 3:38 PM Tresa Endo PT [phone]

## 2014-04-28 NOTE — ED Notes (Signed)
She is taken to MR at this time thence to the floor.

## 2014-04-28 NOTE — Consult Note (Signed)
Consult Reason for Consult:bilateral LE weakness Referring Physician: Dr Colin Rhein  CC: bilateral LE weakness  HPI: Ann Oconnor is an 39 y.o. female with hx of RRMS presenting to ED with complaints of bilateral LE weakness, numbness, multiple falls, nausea and vomiting. Notes LE symptoms started around 03/12/2014, was found on MRI to have a new T2 lesion and was given 3 day course of IV solumedrol. She did not improve and was admitted to [facility] 2/06 for a 5 day course of plasma exchange. Since then feels she has had a further decline and is unable to ambulate safely. Has decreased oral intake. No BM for 4 days.   She is currently taking tecfidera for her MS. Lab workup in the ED unremarkable. UA pending.   Past Medical History  Diagnosis Date  . MS (multiple sclerosis) 2002  . LGSIL (low grade squamous intraepithelial dysplasia)     Past Surgical History  Procedure Laterality Date  . Cervical cone biopsy  2008    CIN 2    Family History  Problem Relation Age of Onset  . Thyroid disease Mother   . Thyroid disease Sister   . Cancer Paternal Aunt     not sure what kind, maybe cervix  . Thyroid disease Sister     Social History:  reports that she has never smoked. She has never used smokeless tobacco. She reports that she does not drink alcohol or use illicit drugs.  No Known Allergies  Medications: Scheduled:   ROS: Out of a complete 14 system review, the patient complains of only the following symptoms, and all other reviewed systems are negative. +weakness, fatigue, nausea  Physical Examination: Filed Vitals:   04/28/14 0706  BP: 129/80  Pulse: 115  Temp: 98.3 F (36.8 C)  Resp: 20   Physical Exam  Constitutional: He appears well-developed and well-nourished.  Psych: Affect appropriate to situation Eyes: No scleral injection HENT: No OP obstrucion Head: Normocephalic.  Cardiovascular: Normal rate and regular rhythm.  Respiratory: Effort normal and breath  sounds normal.  GI: Soft. Bowel sounds are normal. No distension. There is no tenderness.  Skin: WDI  Neurologic Examination Mental Status: Alert, oriented, thought content appropriate.  Speech fluent without evidence of aphasia.  Mild dysarthria. Able to follow 3 step commands without difficulty. Cranial Nerves: II: funduscopic exam wnl bilaterally, visual fields grossly normal, pupils equal, round, reactive to light and accommodation III,IV, VI: ptosis not present, extra-ocular motions intact bilaterally V,VII: smile symmetric, facial light touch sensation normal bilaterally VIII: hearing normal bilaterally IX,X: gag reflex present XI: trapezius strength/neck flexion strength normal bilaterally XII: tongue strength normal  Motor: Right : Upper extremity    Left:     Upper extremity 5-/5 deltoid       5-/5 deltoid 5-/5 biceps      5-/5 biceps  5/5 triceps      5/5 triceps 5/5 hand grip      5/5 hand grip  Lower extremity     Lower extremity (likely pain related weakness) 2+/5 hip flexor      2+/5 hip flexor 3/5 quadricep      3/5 quadriceps  3/5 hamstrings     3/5 hamstrings 5/5 plantar flexion       3/5 plantar flexion 5/5 plantar extension     3/5 plantar extension Tone and bulk: Sensory: T10 sensory levels Deep Tendon Reflexes: 3+ and symmetric throughout Plantars: Right: downgoing   Left: downgoing Cerebellar: normal finger-to-nose, unable to test HTS due to  pain/weakness Gait: deferred  Laboratory Studies:   Basic Metabolic Panel:  Recent Labs Lab 04/21/14 1643 04/28/14 0504  NA 142 135  K 3.6 4.1  CL 110 102  CO2 22 22  GLUCOSE 91 101*  BUN 8 8  CREATININE 0.50 0.53  CALCIUM 9.0 9.4    Liver Function Tests:  Recent Labs Lab 04/28/14 0504  AST 46*  ALT 25  ALKPHOS 43  BILITOT 1.1  PROT 7.8  ALBUMIN 5.6*    Recent Labs Lab 04/28/14 0504  LIPASE 22   No results for input(s): AMMONIA in the last 168 hours.  CBC:  Recent Labs Lab  04/21/14 1643 04/28/14 0504  WBC 7.0 8.4  NEUTROABS 4.9 6.3  HGB 10.6* 12.0  HCT 32.1* 36.5  MCV 94.1 94.6  PLT 331 524*    Cardiac Enzymes:  Recent Labs Lab 04/28/14 0503  CKTOTAL 815*    BNP: Invalid input(s): POCBNP  CBG: No results for input(s): GLUCAP in the last 168 hours.  Microbiology: Results for orders placed or performed in visit on 09/02/12  Urine culture     Status: None   Collection Time: 09/02/12 10:58 AM  Result Value Ref Range Status   Colony Count >=100,000 COLONIES/ML  Final   Organism ID, Bacteria KLEBSIELLA PNEUMONIAE  Final      Susceptibility   Klebsiella pneumoniae -  (no method available)    AMPICILLIN >=32 Resistant     AMPICILLIN/SULBACTAM 4 Sensitive     PIP/TAZO <=4 Sensitive     IMIPENEM <=0.25 Sensitive     CEFAZOLIN <=4 Sensitive     CEFOXITIN <=4 Sensitive     CEFTRIAXONE <=1 Sensitive     CEFTAZIDIME <=1 Sensitive     CEFEPIME <=1 Sensitive     GENTAMICIN <=1 Sensitive     TOBRAMYCIN <=1 Sensitive     CIPROFLOXACIN <=0.25 Sensitive     LEVOFLOXACIN <=0.12 Sensitive     NITROFURANTOIN <=16 Sensitive     TRIMETH/SULFA <=20 Sensitive     Coagulation Studies: No results for input(s): LABPROT, INR in the last 72 hours.  Urinalysis:  Recent Labs Lab 04/21/14 1713  COLORURINE YELLOW  LABSPEC 1.020  PHURINE 5.5  GLUCOSEU NEGATIVE  HGBUR LARGE*  BILIRUBINUR NEGATIVE  KETONESUR >80*  PROTEINUR NEGATIVE  UROBILINOGEN 1.0  NITRITE NEGATIVE  LEUKOCYTESUR NEGATIVE    Lipid Panel:     Component Value Date/Time   CHOL 217* 02/09/2013 1103   TRIG 130 02/09/2013 1103   HDL 45 02/09/2013 1103   CHOLHDL 4.8 02/09/2013 1103   VLDL 26 02/09/2013 1103   LDLCALC 146* 02/09/2013 1103    HgbA1C: No results found for: HGBA1C  Urine Drug Screen:  No results found for: LABOPIA, COCAINSCRNUR, LABBENZ, AMPHETMU, THCU, LABBARB  Alcohol Level: No results for input(s): ETH in the last 168 hours.  Other results:  Imaging: No  results found.   Assessment/Plan:  39y/o woman with history of RRMS, HTN, constipation presenting with progressive worsening of bilateral LE weakness, paresthesias leading to gait instability and falls. Currently taking tecfidera BID for her RRMS. Suspect weakness and falls related to a combination of MS flare and deconditioning.   - will start 3 day course of IV solumedrol -last MRI brain in 06/2013 so will repeat MRI brain with and without contrast -Rehab evaluation -will need outpatient follow up with her neurologist, due to marked decline on tecfidera may need to consider alternative therapy.   Jim Like, DO [facility] [phone]  If 7pm- 7am, please  page neurology on call as listed in [place]. 04/28/2014, 10:50 AM

## 2014-04-28 NOTE — ED Notes (Signed)
Patient has attempted to urinate on the bedpan twice unsuccessfully.

## 2014-04-28 NOTE — ED Notes (Signed)
I have just given report to Barnetta Chapel, RN on [address].  We have just heard from MRI--and they will come to get pt. In ~15 min.

## 2014-04-28 NOTE — ED Notes (Signed)
Per EMS, patient has had nausea for 3 days with onset of vomiting since midnight. Also, having bilateral hip pain since Saturday. Pain described as throbbing, 7/10, and increased pain with palpitation. No vomiting noted with EMS.

## 2014-04-28 NOTE — ED Notes (Signed)
Attempted an IV x 2 but unsuccessful. Notified Tiffany, RN (Camera operator) of need of IV line.

## 2014-04-28 NOTE — ED Provider Notes (Signed)
CSN: [id number]     Arrival date & time 04/28/14  0423 History   First MD Initiated Contact with Patient 04/28/14 [phone]     Chief Complaint  Patient presents with  . Nausea  . Emesis     (Consider location/radiation/quality/duration/timing/severity/associated sxs/prior Treatment) Patient is a 39 y.o. female presenting with general illness.  Illness Location:  Generalized, legs bil Quality:  Malaise, nausea, vomiting, and bil leg sharp pain Severity:  Moderate Onset quality:  Gradual Timing:  Constant Progression:  Worsening Chronicity:  Chronic Context:  HO MS, symptoms worsening over last 4 months, worse over last week.  Has new thoracic signal abnormality on MRI Relieved by:  Nothing Worsened by:  Movement Associated symptoms: nausea and vomiting   Associated symptoms: no abdominal pain, no congestion, no diarrhea and no fever     Past Medical History  Diagnosis Date  . MS (multiple sclerosis) 2002  . LGSIL (low grade squamous intraepithelial dysplasia)    Past Surgical History  Procedure Laterality Date  . Cervical cone biopsy  2008    CIN 2   Family History  Problem Relation Age of Onset  . Thyroid disease Mother   . Thyroid disease Sister   . Cancer Paternal Aunt     not sure what kind, maybe cervix  . Thyroid disease Sister    History  Substance Use Topics  . Smoking status: Never Smoker   . Smokeless tobacco: Never Used  . Alcohol Use: No   OB History    Gravida Para Term Preterm AB TAB SAB Ectopic Multiple Living   0              Review of Systems  Constitutional: Negative for fever.  HENT: Negative for congestion.   Gastrointestinal: Positive for nausea and vomiting. Negative for abdominal pain and diarrhea.  All other systems reviewed and are negative.     Allergies  Review of patient's allergies indicates no known allergies.  Home Medications   Prior to Admission medications   Medication Sig Start Date End Date Taking? Authorizing  Provider  clonazePAM (KLONOPIN) 0.5 MG tablet TAKE 1 TABLET BY MOUTH EVERY NIGHT AT BEDTIME 07/20/12  Yes Marcial Pacas, MD  Dimethyl Fumarate 240 MG CPDR Take 1 capsule by mouth 2 (two) times daily.   Yes Historical Provider, MD  gabapentin (NEURONTIN) 400 MG capsule Take 1 capsule (400 mg total) by mouth 2 (two) times daily. 10/15/12  Yes Marcial Pacas, MD  PARoxetine (PAXIL-CR) 37.5 MG 24 hr tablet TAKE 1 TABLET BY MOUTH DAILY   Yes Marcial Pacas, MD  valsartan (DIOVAN) 80 MG tablet Take 80 mg by mouth daily.   Yes Historical Provider, MD  Vitamin D, Ergocalciferol, (DRISDOL) 50000 UNITS CAPS capsule 2 (two) times a week. 04/12/14  Yes Historical Provider, MD   BP 126/76 mmHg  Pulse 108  Temp(Src) 98.5 F (36.9 C) (Oral)  Resp 16  Ht 5\' 4"  (1.626 m)  Wt 242 lb 11.2 oz (110.088 kg)  BMI 41.64 kg/m2  SpO2 100%  LMP 04/18/2014 Physical Exam  Constitutional: She is oriented to person, place, and time. She appears well-developed and well-nourished.  HENT:  Head: Normocephalic and atraumatic.  Right Ear: External ear normal.  Left Ear: External ear normal.  Eyes: Conjunctivae and EOM are normal. Pupils are equal, round, and reactive to light.  Neck: Normal range of motion. Neck supple.  Cardiovascular: Normal rate, regular rhythm, normal heart sounds and intact distal pulses.   Pulmonary/Chest: Effort normal  and breath sounds normal.  Abdominal: Soft. Bowel sounds are normal. There is no tenderness.  Musculoskeletal: Normal range of motion.  Prominent bruising over bil le  Neurological: She is alert and oriented to person, place, and time.  Decreased sensation bil LE with 3/5 strength bil  Skin: Skin is warm and dry.  Vitals reviewed.   ED Course  Procedures (including critical care time) Labs Review Labs Reviewed  CBC WITH DIFFERENTIAL/PLATELET - Abnormal; Notable for the following:    RBC 3.86 (*)    RDW 16.2 (*)    Platelets 524 (*)    All other components within normal limits   COMPREHENSIVE METABOLIC PANEL - Abnormal; Notable for the following:    Glucose, Bld 101 (*)    Albumin 5.6 (*)    AST 46 (*)    All other components within normal limits  URINALYSIS, ROUTINE W REFLEX MICROSCOPIC - Abnormal; Notable for the following:    APPearance CLOUDY (*)    Ketones, ur >80 (*)    All other components within normal limits  CK - Abnormal; Notable for the following:    Total CK 815 (*)    All other components within normal limits  LIPASE, BLOOD  PREGNANCY, URINE    Imaging Review Mr Brain Wo Contrast  04/28/2014   CLINICAL DATA:  New onset of lower extremity weakness. History of multiple sclerosis. Initial encounter.  EXAM: MRI HEAD WITHOUT CONTRAST  TECHNIQUE: Multiplanar, multiecho pulse sequences of the brain and surrounding structures were obtained without intravenous contrast.  COMPARISON:  None.  FINDINGS: Patient was unable to complete the exam but overall the study is diagnostic.  Widespread areas of mild to moderately restricted diffusion are seen throughout the cerebral, brainstem, and cerebellar white matter. On T2 and FLAIR images, there are concentric zones of demyelination in at least two of these lesions, most notable in the RIGHT parietal periventricular white matter and LEFT posterior frontal subcortical/periventricular white matter. Other significant lesions are seen in the LEFT greater than RIGHT middle cerebellar peduncles, LEFT occipital lobe, and RIGHT centrum semiovale. These areas display varying degrees of diffusion restriction on DWI sequence. 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  Compared with 2013 there is significant progression of disease. The previous exam showed minor subcentimeter predominantly periventricular lesions, with no restricted diffusion.  IMPRESSION: Widespread areas of restricted diffusion, T2 and FLAIR hyperintensity, and concentric zones of demyelination consistent  with fulminant acute MS/Balo Concentric Sclerosis.   Electronically Signed   By: Rolla Flatten M.D.   On: 04/28/2014 14:30     EKG Interpretation None      MDM   Final diagnoses:  MS (multiple sclerosis)    39 y.o. female with pertinent PMH of relapsing/remitting MS presents with acute on chronic pain and weakness in her legs, such that she is unable to stand.  She was seen here a week ago for the same symptoms and was referred to a SNF, however insurance denied coverage, so she went home.  She does not feel that she can care for herself, and given the worsening of her condition, I agree.  Elba Barman today as above.  I spoke with neurology, and we agreed that admission was warranted.  Consulted hospitalist for admission.  Admitted in stable condition  I have reviewed all laboratory and imaging studies if ordered as above  1. MS (multiple sclerosis)         Debby Freiberg, MD 04/28/14 1515

## 2014-04-28 NOTE — Progress Notes (Signed)
Pt came to MRI from ER. Pt was able to complete about half of her imaging study at which point she insisted that she could no longer do any further imaging. I informed her that we could get her pain meds for her back. She stated she was having back spasms. Pt refused meds at this time and stated she would attempt remainder of her MRI if necessary tomorrow. Mri is being completed out as a without contrast study.

## 2014-04-28 NOTE — H&P (Signed)
History and Physical  Ann Oconnor NTI:[id number] DOB: [birth year] DOA: 04/28/2014  Referring physician: Dr. Debby Freiberg, EDP PCP: Delia Chimes, NP  Outpatient Specialists:  1. Neurology: Dr. Elvia Collum, in [place], [place]  Chief Complaint: Bilateral lower extremity weakness and numbness, multiple falls, intermittent nausea, vomiting and bilateral groin pain.  HPI: Ann Oconnor is a 39 y.o. female with history of relapsing remitting multiple sclerosis diagnosed in 2012, HTN, depression, morbid obesity, presented to the [facility] ED for the second time in a week with above complaints. She was in her usual state of health until 03/12/2014 at which time she started experiencing worsening bilateral lower extremity weakness compared to her usual and numbness from waist down. She is single, lives alone and is usually independent of activities of daily living and does not use a cane or a walker to ambulate. She received 3 days of IV steroids followed by additional 10-11 days of oral steroid taper for a T10 sensory level with right >left large fiber sensory loss in lower extremities. According to patient, this did not make significant difference and actually felt worse. She was seen in follow-up by her primary neurologist on 04/12/14 who referred her to [facility] MS clinic and she was hospitalized between 2/6-2/16. She completed 5 days of therapeutic plasma exchange. Patient states that this too did not help and she had persisting lower extremity weakness and numbness. She was discharged home but was unable to keep outpatient therapy appointment secondary to difficulty ambulating. She was seen again in the [facility] ED on 04/21/14 for lower extremity weakness. She was able to stand up and ambulate some. She was discharged home with plan for outpatient placement in a rehabilitation facility. Since that visit, patient states that she has been bedbound and unable to ambulate secondary to  lower extremity weakness, has sustained multiple falls with bruising of lower and upper extremities, intermittent sharp bilateral groin pain rated at 8/10 in severity without radiation and constipation. Patient's mother had to lift her out of bed and lay her on the floor to give her a bath a couple days ago and she was unable to get back in bed and the fire department had to lift her up. She has been using depends since then. She gives 4-5 days history of intermittent nausea and nonbloody emesis . Her oral intake has decreased. Passing flatus but no BM for 4 days. Denies abdominal pain, dysuria, urinary frequency, fever or chills. No sphincter disturbances appreciated. Patient also has new onset of slurred speech and complains of some photophobia. As per patient and family, placement to rehabilitation was denied by her insurance company. Due to progressive worsening of general medical condition, patient presented to the ED. EDP has consulted neuro hospitalist who recommends hospitalization for further evaluation and management.    Review of Systems: All systems reviewed and apart from history of presenting illness, are negative.  Past Medical History  Diagnosis Date  . MS (multiple sclerosis) 2002  . LGSIL (low grade squamous intraepithelial dysplasia)    Past Surgical History  Procedure Laterality Date  . Cervical cone biopsy  2008    CIN 2   Social History:  reports that she has never smoked. She has never used smokeless tobacco. She reports that she does not drink alcohol or use illicit drugs. single. Lives alone. Usually independent of activities of daily living. Since discharge from [facility], has been moving around with the help of an electric wheelchair and walker  but nonambulatory since ED visit 2/17.   No Known Allergies  Family History  Problem Relation Age of Onset  . Thyroid disease Mother   . Thyroid disease Sister   . Cancer Paternal Aunt     not sure what kind, maybe  cervix  . Thyroid disease Sister     Prior to Admission medications   Medication Sig Start Date End Date Taking? Authorizing Provider  clonazePAM (KLONOPIN) 0.5 MG tablet TAKE 1 TABLET BY MOUTH EVERY NIGHT AT BEDTIME 07/20/12  Yes Marcial Pacas, MD  Dimethyl Fumarate 240 MG CPDR Take 1 capsule by mouth 2 (two) times daily.   Yes Historical Provider, MD  gabapentin (NEURONTIN) 400 MG capsule Take 1 capsule (400 mg total) by mouth 2 (two) times daily. 10/15/12  Yes Marcial Pacas, MD  PARoxetine (PAXIL-CR) 37.5 MG 24 hr tablet TAKE 1 TABLET BY MOUTH DAILY   Yes Marcial Pacas, MD  valsartan (DIOVAN) 80 MG tablet Take 80 mg by mouth daily.   Yes Historical Provider, MD  Vitamin D, Ergocalciferol, (DRISDOL) 50000 UNITS CAPS capsule 2 (two) times a week. 04/12/14  Yes Historical Provider, MD   Physical Exam: Filed Vitals:   04/28/14 0425 04/28/14 0430 04/28/14 0706  BP: 111/71  129/80  Pulse: 109  115  Temp: 97.4 F (36.3 C)  98.3 F (36.8 C)  TempSrc: Oral  Oral  Resp: 18  20  Height:  5\' 4"  (1.626 m)   Weight:  111.131 kg (245 lb)   SpO2: 100% 100% 99%   patient was examined with her mother and sister at bedside.   General exam: Moderately built and  morbidly obese female, lying comfortably supine on the gurney in no obvious distress.  Head, eyes and ENT: Nontraumatic and normocephalic. Pupils equally reacting to light and accommodation. Oral mucosa borderline hydration.  Neck: Supple. No JVD, carotid bruit or thyromegaly.  Lymphatics: No lymphadenopathy.  Respiratory system: Clear to auscultation. No increased work of breathing.  Cardiovascular system: S1 and S2 heard, regularly regular mild tachycardia . No JVD, murmurs, gallops, clicks or pedal edema.  Gastrointestinal system: Abdomen is nondistended, soft and nontender. Normal bowel sounds heard. No organomegaly or masses appreciated.  Central nervous system: Alert and oriented 4 . Dysarthria +  Extremities: Symmetric 5 x 5 power in  upper extremities and at least grade 4 x 5 power in lower extremities. Patchy bilateral sensory deficits in lower extremities below T10 level. Peripheral pulses symmetrically felt.   Skin: multiple bruises over bilateral lower extremity, some over upper extremities and anterior abdominal wall.  Musculoskeletal system: Negative exam.  Psychiatry: Pleasant and cooperative.   Labs on Admission:  Basic Metabolic Panel:  Recent Labs Lab 04/21/14 1643 04/28/14 0504  NA 142 135  K 3.6 4.1  CL 110 102  CO2 22 22  GLUCOSE 91 101*  BUN 8 8  CREATININE 0.50 0.53  CALCIUM 9.0 9.4   Liver Function Tests:  Recent Labs Lab 04/28/14 0504  AST 46*  ALT 25  ALKPHOS 43  BILITOT 1.1  PROT 7.8  ALBUMIN 5.6*    Recent Labs Lab 04/28/14 0504  LIPASE 22   No results for input(s): AMMONIA in the last 168 hours. CBC:  Recent Labs Lab 04/21/14 1643 04/28/14 0504  WBC 7.0 8.4  NEUTROABS 4.9 6.3  HGB 10.6* 12.0  HCT 32.1* 36.5  MCV 94.1 94.6  PLT 331 524*   Cardiac Enzymes:  Recent Labs Lab 04/28/14 0503  CKTOTAL 815*  BNP (last 3 results) No results for input(s): PROBNP in the last 8760 hours. CBG: No results for input(s): GLUCAP in the last 168 hours.  Radiological Exams on Admission: No results found.  EKG: none seen in Epic for today.   Assessment/Plan Principal Problem:   Multiple sclerosis Active Problems:   Constipation   Lower extremity weakness   Numbness of both lower extremities   Multiple falls   Nausea & vomiting   39 year old female patient with history of relapsing and remitting multiple sclerosis, recently treated with 2 weeks of IV/oral steroid taper for T10 sensory level followed by hospitalization for therapeutic plasma exchange 5, presents with worsening bilateral lower extremity numbness, weakness, multiple falls, new onset slurred speech,? Photophobia, nausea, vomiting and poor appetite.  1. Possible MS flare: Admit to medical floor.  Neurology has been consulted by EDP. Await their recommendations regarding need for further imaging i.e. MRI brain and treatment (? High-dose IV steroids). PT and OT evaluation. 2. Nausea and vomiting: Unclear etiology. Trial of clear liquids. IV PPI and when necessary antiemetics. IV fluids. Advance diet as tolerated. 3. Mild dehydration: Secondary to GI losses. IV fluids. 4. Mild rhabdomyolysis: Possibly from falls. IV fluids and follow CK in a.m. 5. Constipation: Bowel regimen 6. Essential hypertension: Based on medication review. Resume home medications from a.m. given soft blood pressures today. 7. Depression: Continue home medications. 8. Multiple falls: PT and OT evaluation. May need CIR/SNF at discharge.    Code Status: Full   Family Communication: discussed with patient's mother and sister at bedside.  Disposition Plan: may require CIR/SNF at discharge.   Time spent:  67 minutes   HONGALGI,ANAND, MD, FACP, FHM. [facility] Pager [phone]  If 7PM-7AM, please contact night-coverage [email or web address] Password [facility] 04/28/2014, 9:58 AM

## 2014-04-29 DIAGNOSIS — E860 Dehydration: Secondary | ICD-10-CM | POA: Diagnosis present

## 2014-04-29 DIAGNOSIS — Z79899 Other long term (current) drug therapy: Secondary | ICD-10-CM | POA: Diagnosis not present

## 2014-04-29 DIAGNOSIS — G35 Multiple sclerosis: Secondary | ICD-10-CM | POA: Diagnosis present

## 2014-04-29 DIAGNOSIS — R200 Anesthesia of skin: Secondary | ICD-10-CM | POA: Diagnosis present

## 2014-04-29 DIAGNOSIS — R112 Nausea with vomiting, unspecified: Secondary | ICD-10-CM | POA: Diagnosis present

## 2014-04-29 DIAGNOSIS — R29898 Other symptoms and signs involving the musculoskeletal system: Secondary | ICD-10-CM

## 2014-04-29 DIAGNOSIS — Z87410 Personal history of cervical dysplasia: Secondary | ICD-10-CM | POA: Diagnosis not present

## 2014-04-29 DIAGNOSIS — R296 Repeated falls: Secondary | ICD-10-CM | POA: Diagnosis present

## 2014-04-29 DIAGNOSIS — G8929 Other chronic pain: Secondary | ICD-10-CM | POA: Diagnosis present

## 2014-04-29 DIAGNOSIS — M6282 Rhabdomyolysis: Secondary | ICD-10-CM | POA: Diagnosis present

## 2014-04-29 DIAGNOSIS — F329 Major depressive disorder, single episode, unspecified: Secondary | ICD-10-CM | POA: Diagnosis present

## 2014-04-29 DIAGNOSIS — Z6841 Body Mass Index (BMI) 40.0 and over, adult: Secondary | ICD-10-CM | POA: Diagnosis not present

## 2014-04-29 DIAGNOSIS — R208 Other disturbances of skin sensation: Secondary | ICD-10-CM

## 2014-04-29 DIAGNOSIS — K5900 Constipation, unspecified: Secondary | ICD-10-CM | POA: Diagnosis present

## 2014-04-29 DIAGNOSIS — I10 Essential (primary) hypertension: Secondary | ICD-10-CM | POA: Diagnosis present

## 2014-04-29 LAB — CK: CK TOTAL: 448 U/L — AB (ref 7–177)

## 2014-04-29 LAB — CBC
HCT: 34.1 % — ABNORMAL LOW (ref 36.0–46.0)
Hemoglobin: 11.2 g/dL — ABNORMAL LOW (ref 12.0–15.0)
MCH: 31.2 pg (ref 26.0–34.0)
MCHC: 32.8 g/dL (ref 30.0–36.0)
MCV: 95 fL (ref 78.0–100.0)
PLATELETS: 515 10*3/uL — AB (ref 150–400)
RBC: 3.59 MIL/uL — AB (ref 3.87–5.11)
RDW: 16.3 % — AB (ref 11.5–15.5)
WBC: 5.8 10*3/uL (ref 4.0–10.5)

## 2014-04-29 LAB — BASIC METABOLIC PANEL
Anion gap: 10 (ref 5–15)
BUN: 7 mg/dL (ref 6–23)
CO2: 19 mmol/L (ref 19–32)
Calcium: 9.3 mg/dL (ref 8.4–10.5)
Chloride: 108 mmol/L (ref 96–112)
Creatinine, Ser: 0.46 mg/dL — ABNORMAL LOW (ref 0.50–1.10)
GFR calc Af Amer: >90 mL/min (ref >90)
GLUCOSE: 132 mg/dL — AB (ref 70–99)
POTASSIUM: 4.1 mmol/L (ref 3.5–5.1)
Sodium: 137 mmol/L (ref 135–145)

## 2014-04-29 LAB — GLUCOSE, CAPILLARY
GLUCOSE-CAPILLARY: 146 mg/dL — AB (ref 70–99)
Glucose-Capillary: 123 mg/dL — ABNORMAL HIGH (ref 70–99)
Glucose-Capillary: 117 mg/dL — ABNORMAL HIGH (ref 70–99)
Glucose-Capillary: 123 mg/dL — ABNORMAL HIGH (ref 70–99)

## 2014-04-29 MED ORDER — LIP MEDEX EX OINT
TOPICAL_OINTMENT | CUTANEOUS | Status: AC
  Administered 2014-04-29: 12:00:00
  Filled 2014-04-29: qty 7

## 2014-04-29 MED ORDER — DIPHENHYDRAMINE HCL 25 MG PO CAPS
25.0000 mg | ORAL_CAPSULE | Freq: Four times a day (QID) | ORAL | Status: DC | PRN
  Administered 2014-04-29: 25 mg via ORAL
  Filled 2014-04-29: qty 1

## 2014-04-29 MED ORDER — PANTOPRAZOLE SODIUM 40 MG PO TBEC
40.0000 mg | DELAYED_RELEASE_TABLET | Freq: Every day | ORAL | Status: DC
  Administered 2014-04-30 – 2014-05-03 (×4): 40 mg via ORAL
  Filled 2014-04-29 (×4): qty 1

## 2014-04-29 NOTE — Progress Notes (Signed)
Rehab Admissions Coordinator Note:  Patient was screened by Cleatrice Burke for appropriateness for an Inpatient Acute Rehab Consult. Per PT recommendation  At this time, we are recommending [facility]. Pt currently not at a level that [place] would approve an inpt rehab stay. Please let us know if pt functionally improves with solumedrol treatment. Feel she will need prolonged stay in rehab before returning home with limited assist noted per home care pta. Noted SW is working on [employer], Audelia Acton 04/29/2014, 12:37 PM  I can be reached at [phone].

## 2014-04-29 NOTE — Progress Notes (Addendum)
Clinical Social Work Department CLINICAL SOCIAL WORK PLACEMENT NOTE 04/29/2014  Patient:  Ann Oconnor, Ann Oconnor  Account Number:  [id number] Admit date:  04/28/2014  Clinical Social Worker:  Maryln Manuel  Date/time:  04/29/2014 10:30 AM  Clinical Social Work is seeking post-discharge placement for this patient at the following level of care:   [place]   (*CSW will update this form in Epic as items are completed)   04/29/2014  Patient/family provided with [facility] Department of Clinical Social Work's list of facilities offering this level of care within the geographic area requested by the patient (or if unable, by the patient's family).  04/29/2014  Patient/family informed of their freedom to choose among providers that offer the needed level of care, that participate in Medicare, Medicaid or managed care program needed by the patient, have an available bed and are willing to accept the patient.  04/29/2014  Patient/family informed of [facility]' ownership interest in [facility], as well as of the fact that they are under no obligation to receive care at this facility.  PASARR submitted to EDS on 04/29/2014 PASARR number received on 04/29/2014  FL2 transmitted to all facilities in geographic area requested by pt/family on  04/29/2014 FL2 transmitted to all facilities within larger geographic area on   Patient informed that his/her managed care company has contracts with or will negotiate with  certain facilities, including the following:     Patient/family informed of bed offers received:  04/30/2014 Patient chooses bed at [facility] and [place] recommends and patient chooses bed at    Patient to be transferred to  on  [facility] [place] on 05/03/2014 Patient to be transferred to facility by ambulance Corey Harold) Patient and family notified of transfer on 05/03/2014 Name of family member notified:  Pt and pt mother, June at  bedside  The following physician request were entered in Epic:   Additional Comments:  Alison Murray, MSW, [place] Work [phone]

## 2014-04-29 NOTE — Evaluation (Signed)
Occupational Therapy Evaluation Patient Details Name: Ann Oconnor MRN: [id number] DOB: [birth year] Today's Date: 04/29/2014    History of Present Illness 39 year old female with history of MS, HTN, constipation presenting with progressive worsening of bilateral LE weakness, paresthesias leading to gait instability and falls   Clinical Impression   This pt was admitted for the above problems.  She has needed progressive assistance with ADLs and was mostly functioning at bed level with assistance, including using bedpan.  She has a normal bed at home.  She will benefit from skilled OT to increase independence with adls.  Goals in acute focus on self-feeding and mobility related to ADLs to decrease burden of care and increase her participation.    Follow Up Recommendations  SNF    Equipment Recommendations  None recommended by OT    Recommendations for Other Services       Precautions / Restrictions Precautions Precautions: Fall Restrictions Weight Bearing Restrictions: No      Mobility Bed Mobility WITH PT Overal bed mobility: Needs Assistance;+2 for physical assistance Bed Mobility: Rolling Rolling: Total assist           Transfers                 General transfer comment: unable    Balance                                        ADL Overall ADL's : Needs assistance/impaired     Grooming: Set up;Bed level   Upper Body Bathing: Set up;Bed level   Lower Body Bathing: Total assistance;+2 for physical assistance;Bed level   Upper Body Dressing : Moderate assistance;Bed level   Lower Body Dressing: Total assistance;+2 for physical assistance;Bed level                 General ADL Comments: Pt needed total A  x 2 to roll earlier with PT.  Pt had pain and itching during OT evaluation.  Will try built up foam for utensils, lidded cup with handle, and built up foam for stylis for phone:  dropped off to room--did not observe self  feeding this session:  Pt reports dropping utensils and spillage.     Vision     Perception     Praxis      Pertinent Vitals/Pain Pain Assessment: 0-10 Pain Score: 8  Pain Location: groin Pain Descriptors / Indicators: Aching Pain Intervention(s): Limited activity within patient's tolerance     Hand Dominance     Extremity/Trunk Assessment Upper Extremity Assessment Upper Extremity Assessment: RUE deficits/detail;LUE deficits/detail RUE Deficits / Details: strength grossly 4/5; reports sensation is normal but drops objects at times. mild decreased coordination placing cup on table--positioning may be part of this.  Intrinsics WFLs LUE Deficits / Details: see RUE         Communication Communication Communication:  (speech is slurred but understandable)   Cognition Arousal/Alertness: Awake/alert Behavior During Therapy: WFL for tasks assessed/performed Overall Cognitive Status: Within Functional Limits for tasks assessed                     General Comments       Exercises       Shoulder Instructions      Home Living Family/patient expects to be discharged to:: Unsure Living Arrangements: Alone  Prior Functioning/Environment Level of Independence: Needs assistance  Gait / Transfers Assistance Needed: recently (since Jan per pt) requiring more assist, less ability to mobilize, mostly bed bound     Comments: pt not functioning well at home, requiring increased assist, declining mobility    OT Diagnosis: Generalized weakness;Acute pain   OT Problem List: Decreased strength;Decreased activity tolerance;Impaired balance (sitting and/or standing);Decreased knowledge of use of DME or AE;Decreased coordination;Pain   OT Treatment/Interventions: Self-care/ADL training;DME and/or AE instruction;Therapeutic activities;Patient/family education;Balance training    OT Goals(Current goals can be found in the  care plan section) Acute Rehab OT Goals Patient Stated Goal: improve mobility and strength OT Goal Formulation: With patient Time For Goal Achievement: 05/13/14 Potential to Achieve Goals: Good ADL Goals Pt Will Perform Eating: with modified independence;with adaptive utensils;bed level (without dropping food/spilling drink) Additional ADL Goal #1: pt will roll to bil sides with bedrails and max A for ADLs Additional ADL Goal #2: Pt will sit EOB with max A for trunk support in preparation for transfers/seated ADLS  OT Frequency: Min 2X/week   Barriers to D/C:            Co-evaluation              End of Session    Activity Tolerance: Patient limited by pain Patient left: in bed;with nursing/sitter in room   Time: 2703-5009 OT Time Calculation (min): 10 min Charges:  OT General Charges $OT Visit: 1 Procedure OT Evaluation $Initial OT Evaluation Tier I: 1 Procedure G-Codes:    Sidrah Harden 05-03-14, 2:47 PM   Lesle Chris, OTR/L [phone] 03-May-2014

## 2014-04-29 NOTE — Progress Notes (Addendum)
Clinical Social Work Department BRIEF PSYCHOSOCIAL ASSESSMENT 04/29/2014  Patient:  Ann Oconnor, Ann Oconnor     Account Number:  [id number]     Admit date:  04/28/2014  Clinical Social Worker:  Ann Oconnor  Date/Time:  04/29/2014 10:15 AM  Referred by:  Physician  Date Referred:  04/29/2014 Referred for  SNF Placement   Other Referral:   Interview type:  Patient Other interview type:   and patient mother at bedside    PSYCHOSOCIAL DATA Living Status:  ALONE Admitted from facility:   Level of care:   Primary support name:  Ann Oconnor/mother/[phone] Primary support relationship to patient:  PARENT Degree of support available:   adequate    CURRENT CONCERNS Current Concerns  Post-Acute Placement   Other Concerns:    SOCIAL WORK ASSESSMENT / PLAN CSW received referral for New SNF. CSW received notification from [place] liaison, Ann Oconnor that [facility] MSW was working to place pt in a SNF. Per report, the pt does not have a caregiver in the home, and has not been able to sufficiently care for herself. Per [facility], placement at [facility] was in the process of being arranged.    CSW met with pt at bedside. Pt mother present at this time. CSW introduced self and explained role. CSW discussed with pt that CSW was notified that [facility] was assisting pt with placement into [facility]. Pt confirmed. CSW inquired with pt if she wishes to pursue SNF following hospitalization and pt agreeable. CSW provided support as pt discussed that she is having an MS flare and has not been able to care for herself at home. Pt agreeable to [facility] for placement as this was already in the process from home. CSW discussed that CSW will initiate search to [name] in order for [name] to initiate insurance authorization through [name].    CSW completed FL2 and sent pt clinicals to [facility]. CSW awaiting PT/OT consults in order to provide to  [facility].    CSW contacted [facility] via telephone and spoke with admissions coordinator, Ann Oconnor. Per admissions, Ann Oconnor was attempting to place pt since pt left [facility], but pt insurance initially denied placement because pt did not have recommendation for SNF from [facility] and discharged home. CSW discussed that PT/OT evaluations pending and once available then CSW will send clinicals to [facility] in order for facility to begin initiation of SNF authorization throught [place].    CSW to continue to follow to provide support and assist with pt disposition needs.   Assessment/plan status:  Psychosocial Support/Ongoing Assessment of Needs Other assessment/ plan:   discharge planning   Information/referral to community resources:   Referral to [name] as [facility] MSW had already initiated process from home    PATIENT'S/FAMILY'S RESPONSE TO PLAN OF CARE: Pt alert and oriented x 4. Support provided as pt having difficulty with pain control. Pt hopeful for [facility] upon discharge as this has been in the process of being arranged from home. Pt mother supportive, but cannot provide the physical assist that pt needs at home.      Ann Oconnor, MSW, [facility] [facility] [phone]

## 2014-04-29 NOTE — Progress Notes (Signed)
[facility]  Patient Status: Active (receiving services up to time of hospitalization)  [facility] is providing the following services: RN, PT, MSW and HHA.  **[facility] MSW was working to place pt in a SNF. The pt does not have a caregiver in the home, and has not been able to sufficiently care for herself.   If patient discharges after hours, please call [phone].   Lurlean Leyden 04/29/2014, 8:43 AM

## 2014-04-29 NOTE — Progress Notes (Signed)
UR completed 

## 2014-04-29 NOTE — Progress Notes (Signed)
PHARMACIST - PHYSICIAN COMMUNICATION DR:   Algis Liming CONCERNING: Proton Pump Inhibitor IV to Oral Route Change Policy  The patient is receiving Protonix by the intravenous route. Based on criteria approved by the Pharmacy and [place], the medication is being converted to the equivalent oral dose form.   These criteria include:  -No Active GI bleeding  -Able to tolerate diet of full liquids (or better) or tube feeding  -Able to tolerate other medications by the oral or enteral route   If you have any questions about this conversion, please contact the Pharmacy Department (ext [phone]). Thank you.  Peggyann Juba, PharmD, BCPS 04/29/2014 9:58 AM

## 2014-04-29 NOTE — Progress Notes (Addendum)
Subjective: No overnight events. Tolerating solumedrol well. Scheduled to work with PT today.   MRI brain completed (imaging reviewed). Shows findings consistent with acute fulminate MS  Objective: Current vital signs: BP 110/70 mmHg  Pulse 86  Temp(Src) 97.7 F (36.5 C) (Oral)  Resp 16  Ht 5\' 4"  (1.626 m)  Wt 109.907 kg (242 lb 4.8 oz)  BMI 41.57 kg/m2  SpO2 100%  LMP 04/18/2014 Vital signs in last 24 hours: Temp:  [97.7 F (36.5 C)-98.5 F (36.9 C)] 97.7 F (36.5 C) (02/25 0441) Pulse Rate:  [79-108] 86 (02/25 0441) Resp:  [16-18] 16 (02/25 0441) BP: (106-126)/(67-76) 110/70 mmHg (02/25 0441) SpO2:  [96 %-100 %] 100 % (02/25 0441) Weight:  [109.907 kg (242 lb 4.8 oz)-110.088 kg (242 lb 11.2 oz)] 109.907 kg (242 lb 4.8 oz) (02/25 0500)  Intake/Output from previous day: 02/24 0701 - 02/25 0700 In: 600 [P.O.:600] Out: 200 [Urine:200] Intake/Output this shift:   Nutritional status: Diet regular  Neurologic Exam: Mental Status: Alert, oriented, thought content appropriate.  Cranial Nerves: II: pupils equal, round, reactive to light  III,IV, VI: extra-ocular motions intact bilaterally V,VII: smile symmetric, facial light touch sensation normal bilaterally Motor: Proximal bilateral UE, distal full strength Proximal LE antigravity, distal 4+-5/5 Bilateral hand intention tremor Tone and bulk: Sensory: T10 sensory levels Deep Tendon Reflexes: 3+ and symmetric throughout   Lab Results: Basic Metabolic Panel:  Recent Labs Lab 04/28/14 0504 04/29/14 0421  NA 135 137  K 4.1 4.1  CL 102 108  CO2 22 19  GLUCOSE 101* 132*  BUN 8 7  CREATININE 0.53 0.46*  CALCIUM 9.4 9.3    Liver Function Tests:  Recent Labs Lab 04/28/14 0504  AST 46*  ALT 25  ALKPHOS 43  BILITOT 1.1  PROT 7.8  ALBUMIN 5.6*    Recent Labs Lab 04/28/14 0504  LIPASE 22   No results for input(s): AMMONIA in the last 168 hours.  CBC:  Recent Labs Lab 04/28/14 0504  04/29/14 0421  WBC 8.4 5.8  NEUTROABS 6.3  --   HGB 12.0 11.2*  HCT 36.5 34.1*  MCV 94.6 95.0  PLT 524* 515*    Cardiac Enzymes:  Recent Labs Lab 04/28/14 0503  CKTOTAL 815*    Lipid Panel: No results for input(s): CHOL, TRIG, HDL, CHOLHDL, VLDL, LDLCALC in the last 168 hours.  CBG:  Recent Labs Lab 04/28/14 1653 04/28/14 2124 04/29/14 0734  GLUCAP 87 124* 123*    Microbiology: Results for orders placed or performed in visit on 09/02/12  Urine culture     Status: None   Collection Time: 09/02/12 10:58 AM  Result Value Ref Range Status   Colony Count >=100,000 COLONIES/ML  Final   Organism ID, Bacteria KLEBSIELLA PNEUMONIAE  Final      Susceptibility   Klebsiella pneumoniae -  (no method available)    AMPICILLIN >=32 Resistant     AMPICILLIN/SULBACTAM 4 Sensitive     PIP/TAZO <=4 Sensitive     IMIPENEM <=0.25 Sensitive     CEFAZOLIN <=4 Sensitive     CEFOXITIN <=4 Sensitive     CEFTRIAXONE <=1 Sensitive     CEFTAZIDIME <=1 Sensitive     CEFEPIME <=1 Sensitive     GENTAMICIN <=1 Sensitive     TOBRAMYCIN <=1 Sensitive     CIPROFLOXACIN <=0.25 Sensitive     LEVOFLOXACIN <=0.12 Sensitive     NITROFURANTOIN <=16 Sensitive     TRIMETH/SULFA <=20 Sensitive     Coagulation Studies: No  results for input(s): LABPROT, INR in the last 72 hours.  Imaging: Mr Herby Abraham Contrast  04/28/2014   CLINICAL DATA:  New onset of lower extremity weakness. History of multiple sclerosis. Initial encounter.  EXAM: MRI HEAD WITHOUT CONTRAST  TECHNIQUE: Multiplanar, multiecho pulse sequences of the brain and surrounding structures were obtained without intravenous contrast.  COMPARISON:  None.  FINDINGS: Patient was unable to complete the exam but overall the study is diagnostic.  Widespread areas of mild to moderately restricted diffusion are seen throughout the cerebral, brainstem, and cerebellar white matter. On T2 and FLAIR images, there are concentric zones of demyelination in at  least two of these lesions, most notable in the RIGHT parietal periventricular white matter and LEFT posterior frontal subcortical/periventricular white matter. Other significant lesions are seen in the LEFT greater than RIGHT middle cerebellar peduncles, LEFT occipital lobe, and RIGHT centrum semiovale. These areas display varying degrees of diffusion restriction on DWI sequence. 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  Compared with 2013 there is significant progression of disease. The previous exam showed minor subcentimeter predominantly periventricular lesions, with no restricted diffusion.  IMPRESSION: Widespread areas of restricted diffusion, T2 and FLAIR hyperintensity, and concentric zones of demyelination consistent with fulminant acute MS/Balo Concentric Sclerosis.   Electronically Signed   By: Rolla Flatten M.D.   On: 04/28/2014 14:30    Medications:  Scheduled: . clonazePAM  0.5 mg Oral QHS  . Dimethyl Fumarate  1 capsule Oral BID  . enoxaparin (LOVENOX) injection  40 mg Subcutaneous Daily  . famotidine  20 mg Oral Daily  . gabapentin  400 mg Oral BID  . irbesartan  75 mg Oral Daily  . methylPREDNISolone (SOLU-MEDROL) injection  500 mg Intravenous Q12H  . pantoprazole (PROTONIX) IV  40 mg Intravenous Daily  . PARoxetine  37.5 mg Oral Daily  . senna  2 tablet Oral Daily    Assessment/Plan:  39y/o woman with history of RRMS, HTN, constipation presenting with progressive worsening of bilateral LE weakness, paresthesias leading to gait instability and falls. Currently taking tecfidera BID for her RRMS. Suspect weakness and falls related to a combination of MS flare and deconditioning.   -due to MRI findings will plan for 5 day course of IV solumedrol. Currently on day 2 of 5 -Rehab evaluation -Restart Tecfidera at home dose upon discharge -will need outpatient neurology follow up with Dr Doy Mince of [place] in 1-2 weeks after  discharge. Due to marked decline on tecfidera may need to consider alternative therapy. May be good candidate for tysabri but will defer to outpatient neurologist     Jim Like, DO [facility] [phone]  If 7pm- 7am, please page neurology on call as listed in [place]. 04/29/2014  9:18 AM

## 2014-04-29 NOTE — Progress Notes (Signed)
PROGRESS NOTE    Ann Oconnor NFA:[id number] DOB: October 28, 1975 DOA: 04/28/2014 PCP: Delia Chimes, NP  Outpatient Specialists:  Neurology: Dr. Elvia Collum, in [place], [place]  HPI/Brief narrative 39 year old female patient with history of relapsing and remitting multiple sclerosis, recently treated with 2 weeks of IV/oral steroid taper for T10 sensory level followed by hospitalization for therapeutic plasma exchange 5, presents with worsening bilateral lower extremity numbness, weakness, multiple falls, new onset slurred speech,? Photophobia, nausea, vomiting and poor appetite.   Assessment/Plan:  1. MS flare: MRI brain results as below. Neurology consulted and recommend 5 days of IV Solu-Medrol (currently date 2/5). Holding Tecfidera -resume after Solu-Medrol completed. CIR screen patient-recommending SNF and not felt to be at the level where patient's insurance company would approve inpatient rehabilitation stay. SNF at discharge. 2. Nausea and vomiting: Unclear etiology. Resolved. Continue PPI while on IV steroids. 3. Mild dehydration: Secondary to GI losses. Resolved. DC IV fluids. 4. Mild rhabdomyolysis: Possibly from falls. IV fluids. Check CK. 5. Constipation: Bowel regimen 6. Essential hypertension: Based on medication review. Controlled. Continue ARB. 7. Depression: Continue home medications. 8. Multiple falls: PT and OT evaluation. SNF at discharge. 9. Anemia: Possibly dilutional. Follow CBCs   Code Status: Full Family Communication: None at bedside Disposition Plan: SNF early next week.   Consultants:  Neurology  Procedures:  None  Antibiotics:  None   Subjective: No nausea or vomiting. Tolerating diet. No change in right eye photophobia, slurred speech and lower extremity weakness/numbness.  Objective: Filed Vitals:   04/29/14 0005 04/29/14 0441 04/29/14 0500 04/29/14 1406  BP: 106/67 110/70  133/72  Pulse: 79 86    Temp: 97.9 F (36.6 C) 97.7 F  (36.5 C)  98.6 F (37 C)  TempSrc: Oral Oral  Oral  Resp: 16 16  18   Height:      Weight:   109.907 kg (242 lb 4.8 oz)   SpO2: 96% 100%  98%    Intake/Output Summary (Last 24 hours) at 04/29/14 1602 Last data filed at 04/29/14 1045  Gross per 24 hour  Intake    600 ml  Output    200 ml  Net    400 ml   Filed Weights   04/28/14 0430 04/28/14 1335 04/29/14 0500  Weight: 111.131 kg (245 lb) 110.088 kg (242 lb 11.2 oz) 109.907 kg (242 lb 4.8 oz)     Exam:  General exam: Pleasant young female, moderately built and morbidly obese, sitting up comfortably in bed eating breakfast this morning. Respiratory system: Clear. No increased work of breathing. Cardiovascular system: S1 & S2 heard, RRR. No JVD, murmurs, gallops, clicks or pedal edema. Gastrointestinal system: Abdomen is nondistended, soft and nontender. Normal bowel sounds heard. Central nervous system: Alert and oriented. ? Mild dysarthria Extremities: Symmetric 5 x 5 power in upper extremities and at least grade 3 x 5 power in lower extremities.   Data Reviewed: Basic Metabolic Panel:  Recent Labs Lab 04/28/14 0504 04/29/14 0421  NA 135 137  K 4.1 4.1  CL 102 108  CO2 22 19  GLUCOSE 101* 132*  BUN 8 7  CREATININE 0.53 0.46*  CALCIUM 9.4 9.3   Liver Function Tests:  Recent Labs Lab 04/28/14 0504  AST 46*  ALT 25  ALKPHOS 43  BILITOT 1.1  PROT 7.8  ALBUMIN 5.6*    Recent Labs Lab 04/28/14 0504  LIPASE 22   No results for input(s): AMMONIA in the last 168 hours. CBC:  Recent Labs Lab 04/28/14  1833 04/29/14 0421  WBC 8.4 5.8  NEUTROABS 6.3  --   HGB 12.0 11.2*  HCT 36.5 34.1*  MCV 94.6 95.0  PLT 524* 515*   Cardiac Enzymes:  Recent Labs Lab 04/28/14 0503  CKTOTAL 815*   BNP (last 3 results) No results for input(s): PROBNP in the last 8760 hours. CBG:  Recent Labs Lab 04/28/14 1653 04/28/14 2124 04/29/14 0734 04/29/14 1147  GLUCAP 87 124* 123* 117*    No results found for  this or any previous visit (from the past 240 hour(s)).        Studies: Mr Brain Wo Contrast  04/28/2014   CLINICAL DATA:  New onset of lower extremity weakness. History of multiple sclerosis. Initial encounter.  EXAM: MRI HEAD WITHOUT CONTRAST  TECHNIQUE: Multiplanar, multiecho pulse sequences of the brain and surrounding structures were obtained without intravenous contrast.  COMPARISON:  None.  FINDINGS: Patient was unable to complete the exam but overall the study is diagnostic.  Widespread areas of mild to moderately restricted diffusion are seen throughout the cerebral, brainstem, and cerebellar white matter. On T2 and FLAIR images, there are concentric zones of demyelination in at least two of these lesions, most notable in the RIGHT parietal periventricular white matter and LEFT posterior frontal subcortical/periventricular white matter. Other significant lesions are seen in the LEFT greater than RIGHT middle cerebellar peduncles, LEFT occipital lobe, and RIGHT centrum semiovale. These areas display varying degrees of diffusion restriction on DWI sequence. 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  Compared with 2013 there is significant progression of disease. The previous exam showed minor subcentimeter predominantly periventricular lesions, with no restricted diffusion.  IMPRESSION: Widespread areas of restricted diffusion, T2 and FLAIR hyperintensity, and concentric zones of demyelination consistent with fulminant acute MS/Balo Concentric Sclerosis.   Electronically Signed   By: Rolla Flatten M.D.   On: 04/28/2014 14:30        Scheduled Meds: . clonazePAM  0.5 mg Oral QHS  . enoxaparin (LOVENOX) injection  40 mg Subcutaneous Daily  . famotidine  20 mg Oral Daily  . gabapentin  400 mg Oral BID  . irbesartan  75 mg Oral Daily  . lip balm      . methylPREDNISolone (SOLU-MEDROL) injection  500 mg Intravenous Q12H  . [START ON  04/30/2014] pantoprazole  40 mg Oral Daily  . PARoxetine  37.5 mg Oral Daily  . senna  2 tablet Oral Daily   Continuous Infusions:   Principal Problem:   Multiple sclerosis Active Problems:   Constipation   Lower extremity weakness   Numbness of both lower extremities   Multiple falls   Nausea & vomiting    Time spent: 25 minutes.    Vernell Leep, MD, FACP, FHM. [facility] Pager [phone]  If 7PM-7AM, please contact night-coverage [email or web address] Password [email or web address] 04/29/2014, 4:02 PM    LOS: 0 days

## 2014-04-29 NOTE — Evaluation (Signed)
Physical Therapy Evaluation Patient Details Name: Ann Oconnor MRN: [id number] DOB: [birth year] Today's Date: 04/29/2014   History of Present Illness  39 year old female with history of RRMS, HTN, constipation presenting with progressive worsening of bilateral LE weakness, paresthesias leading to gait instability and falls  Clinical Impression  Pt admitted with above diagnosis. Pt currently with functional limitations due to the deficits listed below (see PT Problem List).  Pt will benefit from skilled PT to increase their independence and safety with mobility to allow discharge to the venue listed below.   Pt with very limited mobility at this time due to trunk and bil LEs weakness.  Pt requiring more assist at home and agreeable to f/u rehab upon d/c.  Recommend CIR screen and if not accepted then pt would benefit from SNF.     Follow Up Recommendations CIR;Supervision/Assistance - 24 hour    Equipment Recommendations  Hospital bed    Recommendations for Other Services       Precautions / Restrictions Precautions Precautions: Fall      Mobility  Bed Mobility Overal bed mobility: Needs Assistance;+2 for physical assistance Bed Mobility: Rolling Rolling: Total assist         General bed mobility comments: pt able to reach over for rail however unable to assist with rolling trunk and lower body, utilized bed pad to assist, unable to bridge, not safe to sit EOB without more assist at this time  Transfers                    Ambulation/Gait                Stairs            Wheelchair Mobility    Modified Rankin (Stroke Patients Only)       Balance Overall balance assessment: Needs assistance Sitting-balance support: Bilateral upper extremity supported Sitting balance-Leahy Scale: Zero Sitting balance - Comments: not attempted due to current assist level however pt reports sitting EOB last week with assist from parents and unable to stay upright  without assist                                     Pertinent Vitals/Pain Pain Assessment: 0-10 Pain Score: 5  Pain Location: bil groin area Pain Descriptors / Indicators: Sore;Discomfort Pain Intervention(s): Limited activity within patient's tolerance;Monitored during session;RN gave pain meds during session;Repositioned    Home Living Family/patient expects to be discharged to:: Unsure Living Arrangements: Alone                    Prior Function Level of Independence: Needs assistance   Gait / Transfers Assistance Needed: recently (since Jan per pt) requiring more assist, less ability to mobilize, mostly bed bound     Comments: pt not functioning well at home, requiring increased assist, declining mobility     Hand Dominance        Extremity/Trunk Assessment   Upper Extremity Assessment: Defer to OT evaluation           Lower Extremity Assessment: RLE deficits/detail;LLE deficits/detail RLE Deficits / Details: grossly 2+/5 throughout, unable to perform even 50% AROM throughout LLE Deficits / Details: no movement of ankle, unable to move against gravity  Cervical / Trunk Assessment: Other exceptions  Communication      Cognition Arousal/Alertness: Awake/alert Behavior During Therapy: WFL for tasks assessed/performed Overall  Cognitive Status: Within Functional Limits for tasks assessed                      General Comments General comments (skin integrity, edema, etc.): bilateral LEs covered in areas ecchymosis     Exercises        Assessment/Plan    PT Assessment Patient needs continued PT services  PT Diagnosis Difficulty walking   PT Problem List Decreased strength;Decreased activity tolerance;Decreased mobility;Decreased coordination;Decreased balance;Decreased range of motion  PT Treatment Interventions DME instruction;Functional mobility training;Balance training;Neuromuscular re-education;Patient/family  education;Therapeutic activities;Wheelchair mobility training;Therapeutic exercise   PT Goals (Current goals can be found in the Care Plan section) Acute Rehab PT Goals Patient Stated Goal: improve mobility and strength PT Goal Formulation: With patient Time For Goal Achievement: 05/20/14 Potential to Achieve Goals: Good    Frequency Min 4X/week   Barriers to discharge        Co-evaluation               End of Session   Activity Tolerance: Patient tolerated treatment well Patient left: in bed;with call bell/phone within reach;with bed alarm set      Functional Assessment Tool Used: clinical judgement Functional Limitation: Mobility: Walking and moving around Mobility: Walking and Moving Around Current Status (Y6599): 100 percent impaired, limited or restricted Mobility: Walking and Moving Around Goal Status (J5701): At least 40 percent but less than 60 percent impaired, limited or restricted    Time: 1127-1145 PT Time Calculation (min) (ACUTE ONLY): 18 min   Charges:   PT Evaluation $Initial PT Evaluation Tier I: 1 Procedure     PT G Codes:   PT G-Codes **NOT FOR INPATIENT CLASS** Functional Assessment Tool Used: clinical judgement Functional Limitation: Mobility: Walking and moving around Mobility: Walking and Moving Around Current Status (X7939): 100 percent impaired, limited or restricted Mobility: Walking and Moving Around Goal Status (Q3009): At least 40 percent but less than 60 percent impaired, limited or restricted    Shiva Sahagian,KATHrine E 04/29/2014, 12:23 PM Carmelia Bake, PT, DPT 04/29/2014 Pager: [phone]

## 2014-04-30 LAB — CBC
HCT: 32.1 % — ABNORMAL LOW (ref 36.0–46.0)
Hemoglobin: 10.6 g/dL — ABNORMAL LOW (ref 12.0–15.0)
MCH: 31.3 pg (ref 26.0–34.0)
MCHC: 33 g/dL (ref 30.0–36.0)
MCV: 94.7 fL (ref 78.0–100.0)
Platelets: 487 10*3/uL — ABNORMAL HIGH (ref 150–400)
RBC: 3.39 MIL/uL — ABNORMAL LOW (ref 3.87–5.11)
RDW: 16.4 % — AB (ref 11.5–15.5)
WBC: 8.1 10*3/uL (ref 4.0–10.5)

## 2014-04-30 LAB — GLUCOSE, CAPILLARY
Glucose-Capillary: 127 mg/dL — ABNORMAL HIGH (ref 70–99)
Glucose-Capillary: 145 mg/dL — ABNORMAL HIGH (ref 70–99)
Glucose-Capillary: 146 mg/dL — ABNORMAL HIGH (ref 70–99)
Glucose-Capillary: 134 mg/dL — ABNORMAL HIGH (ref 70–99)

## 2014-04-30 NOTE — Progress Notes (Signed)
PROGRESS NOTE    Ann Oconnor QAS:[id number] DOB: March 04, 1976 DOA: 04/28/2014 PCP: Delia Chimes, NP  Outpatient Specialists:  Neurology: Dr. Elvia Collum, in [place], [place]  HPI/Brief narrative 39 year old female patient with history of relapsing and remitting multiple sclerosis, recently treated with 2 weeks of IV/oral steroid taper for T10 sensory level followed by hospitalization for therapeutic plasma exchange 5, presents with worsening bilateral lower extremity numbness, weakness, multiple falls, new onset slurred speech,? Photophobia, nausea, vomiting and poor appetite.   Assessment/Plan:  1. MS flare: MRI brain results as below. Neurology consulted and recommend 5 days of IV Solu-Medrol (currently date 3/5). Holding Tecfidera -resume after Solu-Medrol completed. CIR screen patient-recommending SNF and not felt to be at the level where patient's insurance company would approve inpatient rehabilitation stay. SNF at discharge. Patient states that her right eye photophobia and lower extremity weakness have not significantly changed and slurred speech may be slightly worse. Neurology to follow. 2. Nausea and vomiting: Unclear etiology. Resolved. Continue PPI and Pepcid while on IV steroids. 3. Mild dehydration: Secondary to GI losses. Resolved. DC IV fluids. 4. Mild rhabdomyolysis: Possibly from falls. IV fluids. Check CK-improved (815 >448). 5. Constipation: Bowel regimen 6. Essential hypertension: Based on medication review. Controlled. Continue ARB. 7. Depression: Continue home medications. 8. Multiple falls: PT and OT evaluation. SNF at discharge. 9. Anemia: Possibly dilutional. At baseline. Stable   Code Status: Full Family Communication: Discussed with patient's mother on 2/26 Disposition Plan: SNF early next week.   Consultants:  Neurology  Procedures:  None  Antibiotics:  None   Subjective: Tolerating diet. Patient states that her right eye photophobia and  lower extremity weakness have not significantly changed and slurred speech may be slightly worse.   Objective: Filed Vitals:   04/29/14 1406 04/29/14 2049 04/30/14 0459 04/30/14 1451  BP: 133/72 133/71 141/83 113/62  Pulse:  114 104 94  Temp: 98.6 F (37 C) 98.2 F (36.8 C) 98.1 F (36.7 C) 98.5 F (36.9 C)  TempSrc: Oral Oral Oral Oral  Resp: 18 16 16 16   Height:      Weight:      SpO2: 98% 98% 97% 95%    Intake/Output Summary (Last 24 hours) at 04/30/14 1626 Last data filed at 04/30/14 1130  Gross per 24 hour  Intake    240 ml  Output      0 ml  Net    240 ml   Filed Weights   04/28/14 0430 04/28/14 1335 04/29/14 0500  Weight: 111.131 kg (245 lb) 110.088 kg (242 lb 11.2 oz) 109.907 kg (242 lb 4.8 oz)     Exam:  General exam: Pleasant young female, moderately built and morbidly obese, sitting up comfortably in bed. Respiratory system: Clear. No increased work of breathing. Cardiovascular system: S1 & S2 heard, RRR. No JVD, murmurs, gallops, clicks or pedal edema. Gastrointestinal system: Abdomen is nondistended, soft and nontender. Normal bowel sounds heard. Central nervous system: Alert and oriented. Mild dysarthria. Squinting R eye d/t photophobia. PERTLA. Extremities: Symmetric 5 x 5 power in upper extremities and at least grade 3 x 5 power in lower extremities (right lower extremity stronger than left).   Data Reviewed: Basic Metabolic Panel:  Recent Labs Lab 04/28/14 0504 04/29/14 0421  NA 135 137  K 4.1 4.1  CL 102 108  CO2 22 19  GLUCOSE 101* 132*  BUN 8 7  CREATININE 0.53 0.46*  CALCIUM 9.4 9.3   Liver Function Tests:  Recent Labs Lab 04/28/14 0504  AST 46*  ALT 25  ALKPHOS 43  BILITOT 1.1  PROT 7.8  ALBUMIN 5.6*    Recent Labs Lab 04/28/14 0504  LIPASE 22   No results for input(s): AMMONIA in the last 168 hours. CBC:  Recent Labs Lab 04/28/14 0504 04/29/14 0421 04/30/14 0525  WBC 8.4 5.8 8.1  NEUTROABS 6.3  --   --   HGB  12.0 11.2* 10.6*  HCT 36.5 34.1* 32.1*  MCV 94.6 95.0 94.7  PLT 524* 515* 487*   Cardiac Enzymes:  Recent Labs Lab 04/28/14 0503 04/29/14 1645  CKTOTAL 815* 448*   BNP (last 3 results) No results for input(s): PROBNP in the last 8760 hours. CBG:  Recent Labs Lab 04/29/14 1147 04/29/14 1645 04/29/14 2123 04/30/14 0734 04/30/14 1208  GLUCAP 117* 123* 146* 127* 145*    No results found for this or any previous visit (from the past 240 hour(s)).        Studies: No results found.      Scheduled Meds: . clonazePAM  0.5 mg Oral QHS  . enoxaparin (LOVENOX) injection  40 mg Subcutaneous Daily  . famotidine  20 mg Oral Daily  . gabapentin  400 mg Oral BID  . irbesartan  75 mg Oral Daily  . methylPREDNISolone (SOLU-MEDROL) injection  500 mg Intravenous Q12H  . pantoprazole  40 mg Oral Daily  . PARoxetine  37.5 mg Oral Daily  . senna  2 tablet Oral Daily   Continuous Infusions:   Principal Problem:   Multiple sclerosis Active Problems:   Constipation   Lower extremity weakness   Numbness of both lower extremities   Multiple falls   Nausea & vomiting    Time spent: 25 minutes.    Vernell Leep, MD, FACP, FHM. [facility] Pager [phone]  If 7PM-7AM, please contact night-coverage [email or web address] Password [email or web address] 04/30/2014, 4:26 PM    LOS: 1 day

## 2014-04-30 NOTE — Progress Notes (Signed)
CSW continuing to follow.   CSW received notification from RN that pt mother at bedside and requesting to speak with CSW re: update for disposition plan.   CSW met with pt and pt mother, June at bedside.  Pt mother inquired about status with disposition plan.  CSW discussed with pt and pt mother that PT recommended [facility], but [facility] did not feel pt is at the level where patient's insurance company would approve inpatient rehabilitation stay.  CSW discussed with pt and pt mother that [name] has offered a bed and facility initiated insurance authorization through [name] in order to ensure that there are no barriers to receiving insurance authorization prior to pt discharge. Pt mother appreciative as insurance was a barrier to pt getting to [name].   Pt mother expressed concern about transportation to pt doctor's appointments and CSW inquired with [name] who stated that facility can arrange transportation, but transportation would be out of pocket cost to the pt. CSW relayed this information to pt and pt mother at bedside.   Pt feels comfortable with plans for [name] upon discharge. Pt expressed that she has been having difficulty caring for herself and home and feels that [name] will be beneficial to assist her to get stronger and regain her independence.   CSW received phone call from [name] at 3:00 pm stating that facility had received authorization for pt for SNF admission to [name] upon being medically ready for discharge from [name]. CSW met with pt at bedside to notify that authorization has already been received. Pt expressed great excitement and relief to know that she can transition to [name] upon discharge from the hospital. Pt expressed that her mother will be relieved and she will contact her mother later this evening to notify  as pt mother had already left hospital at this time.  CSW to continue to follow to provide support and assist with pt disposition needs. Per MD, anticipate discharge early next week.   Alison Murray, MSW, [place] [employer] [phone]

## 2014-04-30 NOTE — Progress Notes (Signed)
Occupational Therapy Treatment Patient Details Name: Ann Oconnor MRN: [id number] DOB: Feb 07, 1976 Today's Date: 04/30/2014    History of present illness 39 year old female with history of RRMS, HTN, constipation presenting with progressive worsening of bilateral LE weakness, paresthesias leading to gait instability and falls   OT comments  Worked on self-feeding:  Pt did not want to work on bed mobility this session as she had just used bedpan  Follow Up Recommendations  SNF    Equipment Recommendations  None recommended by OT    Recommendations for Other Services      Precautions / Restrictions Precautions Precautions: Fall       Mobility Bed Mobility                  Transfers                      Balance                                   ADL   Eating/Feeding: Set up;Sitting;With adaptive utensils                                     General ADL Comments: set up spoon on built up foam and pt self fed ice cream:  assist to open container.  Pt has been using mug successfully.  Pt does not have stylus here--explained having someone place inside smaller piece of foam.  Depending upon size of stylus, foam may need to be cut or tape rolled around it to build up size.Marland Kitchen  Pt did not want to work on rolling:  had recently used Risk analyst   Behavior During Therapy: [facility] for tasks assessed/performed Overall Cognitive Status: Within Functional Limits for tasks assessed                       Extremity/Trunk Assessment               Exercises     Shoulder Instructions       General Comments      Pertinent Vitals/ Pain       Pain Score: 6  Pain Location: groin Pain Descriptors / Indicators: Aching Pain Intervention(s): Limited activity within patient's tolerance;Monitored during session  Home Living                                           Prior Functioning/Environment              Frequency Min 2X/week     Progress Toward Goals  OT Goals(current goals can now be found in the care plan section)  Progress towards OT goals: Progressing toward goals     Plan      Co-evaluation                 End of Session     Activity Tolerance Patient limited by pain   Patient Left in bed;with nursing/sitter in room   Nurse  Communication          Time: 3614-4315 OT Time Calculation (min): 13 min  Charges: OT General Charges $OT Visit: 1 Procedure OT Treatments $Self Care/Home Management : 8-22 mins  Tamberly Pomplun 04/30/2014, 4:59 PM  Lesle Chris, OTR/L [phone] 04/30/2014

## 2014-04-30 NOTE — Care Management Note (Signed)
    Page 1 of 1   04/30/2014     4:46:10 PM CARE MANAGEMENT NOTE 04/30/2014  Patient:  Ann Oconnor, Ann Oconnor   Account Number:  [id number]  Date Initiated:  04/30/2014  Documentation initiated by:  Dessa Phi  Subjective/Objective Assessment:   39 y/o f admitted w/Multiple sclerosis.     Action/Plan:   From home.   Anticipated DC Date:  05/01/2014   Anticipated DC Plan:  [place]  CM consult      Choice offered to / List presented to:             Status of service:  In process, will continue to follow Medicare Important Message given?   (If response is "NO", the following Medicare IM given date fields will be blank) Date Medicare IM given:   Medicare IM given by:   Date Additional Medicare IM given:   Additional Medicare IM given by:    Discharge Disposition:    Per UR Regulation:  Reviewed for med. necessity/level of care/duration of stay  If discussed at [place] of Stay Meetings, dates discussed:    Comments:  04/30/14 [place] [phone] PT-CIR. rehab coordin-recommended SNf. CSW following for SNF.

## 2014-05-01 LAB — GLUCOSE, CAPILLARY
GLUCOSE-CAPILLARY: 128 mg/dL — AB (ref 70–99)
GLUCOSE-CAPILLARY: 130 mg/dL — AB (ref 70–99)
Glucose-Capillary: 134 mg/dL — ABNORMAL HIGH (ref 70–99)
Glucose-Capillary: 121 mg/dL — ABNORMAL HIGH (ref 70–99)

## 2014-05-01 LAB — CK: CK TOTAL: 286 U/L — AB (ref 7–177)

## 2014-05-01 MED ORDER — MAGNESIUM HYDROXIDE 400 MG/5ML PO SUSP: 30.0000 mL | Freq: Once | ORAL | Status: DC

## 2014-05-01 NOTE — Progress Notes (Signed)
Pt constipated.  Suppository given this morning at 9:52am with a few small pieces of stool.  Pt still feeling urge to have bowel movement.  Digitally disimpacted pt's rectum with moderate amount of firm brown stool.  Pt tolerated well, voices feeling better post disimpaction.

## 2014-05-01 NOTE — Progress Notes (Signed)
PT Cancellation Note  Patient Details Name: Ann Oconnor MRN: [id number] DOB: [birth year]   Cancelled Treatment:    Reason Eval/Treat Not Completed: Pain limiting ability to participate. Pt politely declines any mobility at this time d/t pain; has had pain meds, is assisting     more with rolling per RN;    [name] 05/01/2014, 2:29 PM

## 2014-05-01 NOTE — Progress Notes (Signed)
MD paged earlier for Milk of Magnesia per pt request to help with bowels.  MD gave one time order.  Pt declines needing now since disimpaction and wanted to be to be offered Milk of Mag again later tonight or tomorrow.

## 2014-05-01 NOTE — Progress Notes (Signed)
PROGRESS NOTE    Ann Oconnor UYQ:[id number] DOB: 07/16/1975 DOA: 04/28/2014 PCP: Delia Chimes, NP  Outpatient Specialists:  Neurology: Dr. Elvia Collum, in [place], [place]  HPI/Brief narrative 39 year old female patient with history of relapsing and remitting multiple sclerosis, recently treated with 2 weeks of IV/oral steroid taper for T10 sensory level followed by hospitalization for therapeutic plasma exchange 5, presents with worsening bilateral lower extremity numbness, weakness, multiple falls, new onset slurred speech,? Photophobia, nausea, vomiting and poor appetite. Neurology consulted and patient managed for MS flare with high-dose IV Solu-Medrol 5 days. May DC to SNF on 2/29 with close outpatient follow-up with neurology in 1-2 weeks. Patient requesting new neurologist in [place]-can try to arrange on Monday 2/29.    Assessment/Plan:  1. MS flare: MRI brain results as below. Neurology consulted and recommend 5 days of IV Solu-Medrol (currently date 4/5). Holding Tecfidera -resume 2/29 after Solu-Medrol completed. CIR screened patient-recommending SNF and not felt to be at the level where patient's insurance company would approve inpatient rehabilitation stay. SNF at discharge. Patient states that her right eye photophobia and lower extremity weakness have not significantly changed and slurred speech may be slightly worse. Discussed with neuro hospitalist on call on 2/26: She indicated that effectiveness of steroids can sometimes decrease with frequent use and did not have any further management changes at this time.  2. Nausea and vomiting: Unclear etiology. Resolved. Continue PPI and Pepcid while on IV steroids. 3. Mild dehydration: Secondary to GI losses. Resolved. DC IV fluids. 4. Mild rhabdomyolysis: Possibly from falls. IV fluids. Check CK-improved (815 >448 >286 ). 5. Constipation: Bowel regimen. 2 BMs recorded last night. Patient requesting MOM  6. Essential  hypertension: Based on medication review. Controlled. Continue ARB. 7. Depression: Continue home medications. 8. Multiple falls: PT and OT evaluation. SNF at discharge. 9. Anemia: Possibly dilutional. At baseline. Stable   Code Status: Full Family Communication: Discussed with patient's mother on 2/26 Disposition Plan: SNF early next week.   Consultants:  Neurology  Procedures:  None  Antibiotics:  None   Subjective: Patient states that she has not seen any improvement in her right eye symptoms, slurred speech or lower extremity weakness or numbness.  Objective: Filed Vitals:   04/30/14 2056 04/30/14 2240 05/01/14 0700 05/01/14 1402  BP: 121/75  146/80 121/71  Pulse: 91 88 87 106  Temp: 98.1 F (36.7 C)  97.9 F (36.6 C) 98.8 F (37.1 C)  TempSrc: Oral  Oral Oral  Resp: 16  16 18   Height:      Weight:      SpO2: 97%  98% 98%    Intake/Output Summary (Last 24 hours) at 05/01/14 1443 Last data filed at 05/01/14 0940  Gross per 24 hour  Intake   1018 ml  Output      0 ml  Net   1018 ml   Filed Weights   04/28/14 0430 04/28/14 1335 04/29/14 0500  Weight: 111.131 kg (245 lb) 110.088 kg (242 lb 11.2 oz) 109.907 kg (242 lb 4.8 oz)     Exam:  General exam: Pleasant young female, moderately built and morbidly obese, sitting up comfortably in bed. Respiratory system: Clear. No increased work of breathing. Cardiovascular system: S1 & S2 heard, RRR. No JVD, murmurs, gallops, clicks or pedal edema. Gastrointestinal system: Abdomen is nondistended, soft and nontender. Normal bowel sounds heard. Central nervous system: Alert and oriented. Mild dysarthria. Squinting R eye d/t photophobia. PERTLA. Extremities: Symmetric 5 x 5 power in upper extremities  and at least grade 3 x 5 power in lower extremities (right lower extremity stronger than left).   Data Reviewed: Basic Metabolic Panel:  Recent Labs Lab 04/28/14 0504 04/29/14 0421  NA 135 137  K 4.1 4.1  CL 102  108  CO2 22 19  GLUCOSE 101* 132*  BUN 8 7  CREATININE 0.53 0.46*  CALCIUM 9.4 9.3   Liver Function Tests:  Recent Labs Lab 04/28/14 0504  AST 46*  ALT 25  ALKPHOS 43  BILITOT 1.1  PROT 7.8  ALBUMIN 5.6*    Recent Labs Lab 04/28/14 0504  LIPASE 22   No results for input(s): AMMONIA in the last 168 hours. CBC:  Recent Labs Lab 04/28/14 0504 04/29/14 0421 04/30/14 0525  WBC 8.4 5.8 8.1  NEUTROABS 6.3  --   --   HGB 12.0 11.2* 10.6*  HCT 36.5 34.1* 32.1*  MCV 94.6 95.0 94.7  PLT 524* 515* 487*   Cardiac Enzymes:  Recent Labs Lab 04/28/14 0503 04/29/14 1645 05/01/14 0519  CKTOTAL 815* 448* 286*   BNP (last 3 results) No results for input(s): PROBNP in the last 8760 hours. CBG:  Recent Labs Lab 04/29/14 2123 04/30/14 0734 04/30/14 1208 04/30/14 1727 04/30/14 2315  GLUCAP 146* 127* 145* 146* 134*    No results found for this or any previous visit (from the past 240 hour(s)).        Studies: No results found.      Scheduled Meds: . clonazePAM  0.5 mg Oral QHS  . enoxaparin (LOVENOX) injection  40 mg Subcutaneous Daily  . famotidine  20 mg Oral Daily  . gabapentin  400 mg Oral BID  . irbesartan  75 mg Oral Daily  . magnesium hydroxide  30 mL Oral Once  . methylPREDNISolone (SOLU-MEDROL) injection  500 mg Intravenous Q12H  . pantoprazole  40 mg Oral Daily  . PARoxetine  37.5 mg Oral Daily  . senna  2 tablet Oral Daily   Continuous Infusions:   Principal Problem:   Multiple sclerosis Active Problems:   Constipation   Lower extremity weakness   Numbness of both lower extremities   Multiple falls   Nausea & vomiting    Time spent: 25 minutes.    Vernell Leep, MD, FACP, FHM. [facility] Pager [phone]  If 7PM-7AM, please contact night-coverage [email or web address] Password [facility] 05/01/2014, 2:43 PM    LOS: 2 days

## 2014-05-01 NOTE — Plan of Care (Signed)
Problem: Phase I Progression Outcomes Goal: Pain controlled with appropriate interventions Outcome: Progressing Pain in bilateral groin areas. Prns decrease pain.

## 2014-05-02 LAB — GLUCOSE, CAPILLARY
GLUCOSE-CAPILLARY: 121 mg/dL — AB (ref 70–99)
GLUCOSE-CAPILLARY: 123 mg/dL — AB (ref 70–99)
Glucose-Capillary: 121 mg/dL — ABNORMAL HIGH (ref 70–99)
Glucose-Capillary: 130 mg/dL — ABNORMAL HIGH (ref 70–99)

## 2014-05-02 NOTE — Progress Notes (Signed)
[facility] PROGRESS NOTE  Ann Oconnor:[id number] DOB: 11-Jun-1975 DOA: 04/28/2014 PCP: Delia Chimes, NP  Brief narrative 39 year old female with history of relapsing and remitting multiple sclerosis, follows with neurologist in [place] (Dr. Elvia Collum) recently treated with a 2 weeks of IV and oral steroid taper for T10 sensory level in January followed by hospitalization at [facility] on 2/6 for therapeutic plasma exchange x 5 days, presents with worsening bilateral lower extremity numbness, with weakness and multiple falls, new onset slurred speech,? Photophobia, nausea, vomiting and poor appetite. MRI of the brain suggestive of MS flare. Neurology was consulted on admission and patient started on IV Solu-Medrol for a 5 day course.   Assessment/Plan: MS flare On 5 day course of IV Solu-Medrol as per neurology recommendation. Completes today. Holding Tecfidera which she is on as outpt,  which will be resumed tomorrow. Patient reports her symptoms of lower extremity numbness and weakness has not changed since admission. Also reports some slurred speech ( speech appears clear to me). Has not been able to ambulate for past month. Recommended for skilled nursing facility upon discharge. -  discussed with neuro hospitalist this morning. recommended that effectiveness of steroid can sometimes decrease with frequent use. Recommended to continue IV Solu-Medrol for now. Patient will be seen tomorrow. -Patient would like to follow-up with a new neurologist in [place]. Will arrange tomorrow.  Nausea and vomiting Resolved. No clear etiology. Continue PPI and Pepcid while on IV steroid.  Dehydration Mild secondary to nausea and vomiting. Resolved with IV fluids.  Mild rhabdomyolysis (800s on presentation) Possibly from falls. Improving with IV fluids. CK has improved  Essential hypertension Resume home medications. Stable at this time  Depression Stable. Continue home  medications  Multiple falls Secondary to MS. Skilled nursing facility upon discharge as per PT evaluation.  Constipation Continue bowel regimen   Code Status: Full code Family Communication: None at bedside  Disposition Plan: To skilled nursing facility possibly tomorrow Ann Oconnor place)   Consultants:  Neurology  Procedures:  MRI brain  Antibiotics:  None  HPI/Subjective: seen and examined. Reports that her lower extremity numbness and weakness has not improved since admission.  Objective: Filed Vitals:   05/02/14 0505  BP: 124/65  Pulse: 74  Temp: 97.8 F (36.6 C)  Resp: 18    Intake/Output Summary (Last 24 hours) at 05/02/14 0919 Last data filed at 05/01/14 1403  Gross per 24 hour  Intake    424 ml  Output      0 ml  Net    424 ml   Filed Weights   04/28/14 1335 04/29/14 0500 05/02/14 0505  Weight: 110.088 kg (242 lb 11.2 oz) 109.907 kg (242 lb 4.8 oz) 109.907 kg (242 lb 4.8 oz)    Exam:   General:  Middle aged obese female lying in bed in no acute distress  HEENT: No pallor, moist oral mucosa, neck supple  Cardiovascular: Normal S1 and S2, no murmurs rub or gallop  Respiratory: Clear to auscultation bilaterally  GI: Soft, nondistended, nontender, bowel sounds present  Musculoskeletal: Warm, no edema  CNS: Alert and oriented, normal power in upper extremity, 3/5 power in lower extremity with minimal sensations.  Data Reviewed: Basic Metabolic Panel:  Recent Labs Lab 04/28/14 0504 04/29/14 0421  NA 135 137  K 4.1 4.1  CL 102 108  CO2 22 19  GLUCOSE 101* 132*  BUN 8 7  CREATININE 0.53 0.46*  CALCIUM 9.4 9.3   Liver Function Tests:  Recent Labs Lab  04/28/14 0504  AST 46*  ALT 25  ALKPHOS 43  BILITOT 1.1  PROT 7.8  ALBUMIN 5.6*    Recent Labs Lab 04/28/14 0504  LIPASE 22   No results for input(s): AMMONIA in the last 168 hours. CBC:  Recent Labs Lab 04/28/14 0504 04/29/14 0421 04/30/14 0525  WBC 8.4 5.8 8.1   NEUTROABS 6.3  --   --   HGB 12.0 11.2* 10.6*  HCT 36.5 34.1* 32.1*  MCV 94.6 95.0 94.7  PLT 524* 515* 487*   Cardiac Enzymes:  Recent Labs Lab 04/28/14 0503 04/29/14 1645 05/01/14 0519  CKTOTAL 815* 448* 286*   BNP (last 3 results) No results for input(s): BNP in the last 8760 hours.  ProBNP (last 3 results) No results for input(s): PROBNP in the last 8760 hours.  CBG:  Recent Labs Lab 05/01/14 0741 05/01/14 1155 05/01/14 1654 05/01/14 2208 05/02/14 0726  GLUCAP 134* 128* 121* 130* 121*    No results found for this or any previous visit (from the past 240 hour(s)).   Studies: No results found.  Scheduled Meds: . clonazePAM  0.5 mg Oral QHS  . enoxaparin (LOVENOX) injection  40 mg Subcutaneous Daily  . famotidine  20 mg Oral Daily  . gabapentin  400 mg Oral BID  . irbesartan  75 mg Oral Daily  . magnesium hydroxide  30 mL Oral Once  . methylPREDNISolone (SOLU-MEDROL) injection  500 mg Intravenous Q12H  . pantoprazole  40 mg Oral Daily  . PARoxetine  37.5 mg Oral Daily  . senna  2 tablet Oral Daily   Continuous Infusions:     Time spent: 25 minutes    Kailana Benninger, [place]  [facility] Pager [phone]. If 7PM-7AM, please contact night-coverage at [email or web address], password [facility] 05/02/2014, 9:19 AM  LOS: 3 days

## 2014-05-02 NOTE — Plan of Care (Signed)
Problem: Phase I Progression Outcomes Goal: Initial discharge plan identified Outcome: Completed/Met Date Met:  05/02/14 [place] on 2/29     

## 2014-05-03 LAB — GLUCOSE, CAPILLARY
GLUCOSE-CAPILLARY: 116 mg/dL — AB (ref 70–99)
Glucose-Capillary: 119 mg/dL — ABNORMAL HIGH (ref 70–99)

## 2014-05-03 MED ORDER — CLONAZEPAM 0.5 MG PO TABS: 0.5000 mg | ORAL_TABLET | Freq: Every day | ORAL | Status: DC

## 2014-05-03 MED ORDER — SENNA 8.6 MG PO TABS: 2.0000 | ORAL_TABLET | Freq: Every day | ORAL | Status: DC

## 2014-05-03 MED ORDER — FAMOTIDINE 20 MG PO TABS: 20.0000 mg | ORAL_TABLET | Freq: Every day | ORAL | Status: DC

## 2014-05-03 MED ORDER — HYDROCODONE-ACETAMINOPHEN 5-325 MG PO TABS: 2.0000 | ORAL_TABLET | Freq: Four times a day (QID) | ORAL | Status: DC | PRN

## 2014-05-03 MED ORDER — DIMETHYL FUMARATE 240 MG PO CPDR
1.0000 | DELAYED_RELEASE_CAPSULE | Freq: Two times a day (BID) | ORAL | Status: DC
  Administered 2014-05-03: 240 mg via ORAL

## 2014-05-03 MED ORDER — POLYETHYLENE GLYCOL 3350 17 G PO PACK: 17.0000 g | PACK | Freq: Every day | ORAL | Status: DC | PRN

## 2014-05-03 NOTE — Discharge Summary (Signed)
Physician Discharge Summary  Ann Oconnor GLO:[id number] DOB: 10/12/1975 DOA: 04/28/2014  PCP: Delia Chimes, NP  Admit date: 04/28/2014 Discharge date: 05/03/2014  Time spent: 30 minutes  Recommendations for Outpatient Follow-up:  1. Discharge to [facility] place SNF for ongoing PT. 2. Pt will follow up with [facility] Dr Metta Clines as outpt ( office will call for appt.). Please contact her current neurologist Dr Elvia Collum in [place] if you have any questions or concerns in the interim.   Discharge Diagnoses:  Principal Problem:   Multiple sclerosis  Active Problems:   Constipation   Lower extremity weakness   Numbness of both lower extremities   Multiple falls   Nausea & vomiting   Discharge Condition: fair  Diet recommendation: regular  Filed Weights   04/29/14 0500 05/02/14 0505 05/03/14 0545  Weight: 109.907 kg (242 lb 4.8 oz) 109.907 kg (242 lb 4.8 oz) 110.224 kg (243 lb)    History of present illness:  Please refer to admission H&P for details, but in brief, 39 year old female with history of relapsing and remitting multiple sclerosis, follows with neurologist in [place] (Dr. Elvia Collum) recently treated with a 2 weeks of IV and oral steroid taper for T10 sensory level in January followed by hospitalization at [facility] on 2/6 for therapeutic plasma exchange x 5 days, presents with worsening bilateral lower extremity numbness, with weakness and multiple falls, new onset slurred speech,? Photophobia, nausea, vomiting and poor appetite. MRI of the brain suggestive of MS flare."Widespread areas of restricted diffusion, T2 and FLAIR hyperintensity, and concentric zones of demyelination consistent with fulminant acute MS/Balo Concentric Sclerosis." Neurology was consulted on admission and patient started on IV Solu-Medrol for a 5 day course.   Hospital Course:  MS flare On 5 day course of IV Solu-Medrol as per neurology recommendation. Completed on 2/28. Marland Kitchen Spoke  with neurology Dr Nicole Kindred on the phone today. recommneded that since she has completed the course of solumedrol , she should be back on her home regimen Zola Button) until seen by outpt neurology. Her LE weakness and numbness have not improved since admission. Slurred speech and rt eye ptosis seems to have resolved.  - Has not been able to ambulate for past month. -seen by PT and recommended for skilled nursing facility upon discharge. -neuro hospitalist saw pt on admission and spoke me any my partner hospitalists have spoke with them on the phone on 3 different days including today. They  recommended that effectiveness of steroid can sometimes decrease with frequent use. No further recommendations for now given she has completed IV solumedrol and should follow with neurology. -Patient was seeing neurologist Dr Tobin Chad in [facility] and wishes to  follow-up with a new neurologist in [place]. Have contacted [facility]. The office has spoken to patient's mother and will call them with an early appt with Dr Tomi Likens.  Nausea and vomiting Resolved. No clear etiology. Given  PPI and Pepcid while on IV steroid.  Lower extremity pain  c/o pain primarily in LLE. On percocet prn. Continue neurontin.  Dehydration Mild secondary to nausea and vomiting. Resolved with IV fluids.  Mild rhabdomyolysis (800s on presentation) Possibly from falls. Improving with IV fluids. CK has improved  Essential hypertension Stable on valsartan daily.   Depression Stable. Continue paxil.  Multiple falls Secondary to MS. Skilled nursing facility upon discharge as per PT evaluation.  Constipation Continue bowel regimen   Code Status: Full code Family Communication: spoke at length with mother  at bedside  Disposition  Plan: To skilled nursing facility [facility])   Consultants:  Neurology  Procedures:  MRI brain  Antibiotics:  None    Discharge Exam: Filed Vitals:   05/03/14 0545  BP:  125/77  Pulse: 84  Temp: 97.6 F (36.4 C)  Resp: 16     General: Middle aged obese female lying in bed in no acute distress  HEENT: No pallor, moist oral mucosa, neck supple, mild rt eye ptosis  Cardiovascular: Normal S1 and S2, no murmurs rub or gallop  Respiratory: Clear to auscultation bilaterally  GI: Soft, nondistended, nontender, bowel sounds present  Musculoskeletal: Warm, no edema  CNS: Alert and oriented, normal power in upper extremity, 3/5 power in lower extremity with minimal sensations.  Discharge Instructions    Current Discharge Medication List    START taking these medications   Details       HYDROcodone-acetaminophen (NORCO/VICODIN) 5-325 MG per tablet Take 2 tablets by mouth every 6 (six) hours as needed for moderate pain. Qty: 30 tablet, Refills: 0    polyethylene glycol (MIRALAX / GLYCOLAX) packet Take 17 g by mouth daily as needed for mild constipation. Qty: 14 each, Refills: 0    senna (SENOKOT) 8.6 MG TABS tablet Take 2 tablets (17.2 mg total) by mouth daily. Qty: 120 each, Refills: 0      CONTINUE these medications which have CHANGED   Details  clonazePAM (KLONOPIN) 0.5 MG tablet Take 1 tablet (0.5 mg total) by mouth at bedtime. Qty: 30 tablet, Refills: 0      CONTINUE these medications which have NOT CHANGED   Details  Dimethyl Fumarate 240 MG CPDR Take 1 capsule by mouth 2 (two) times daily.    gabapentin (NEURONTIN) 400 MG capsule Take 1 capsule (400 mg total) by mouth 2 (two) times daily. Qty: 60 capsule, Refills: 3    PARoxetine (PAXIL-CR) 37.5 MG 24 hr tablet TAKE 1 TABLET BY MOUTH DAILY Qty: 15 tablet, Refills: 0    valsartan (DIOVAN) 80 MG tablet Take 80 mg by mouth daily.    Vitamin D, Ergocalciferol, (DRISDOL) 50000 UNITS CAPS capsule 2 (two) times a week. Refills: 3       No Known Allergies Follow-up Information    Follow up with JAFFE, [place], DO.   Specialty:  Neurology   Why:  office will call with appt    Contact information:   [address] [place] [address] [phone]        The results of significant diagnostics from this hospitalization (including imaging, microbiology, ancillary and laboratory) are listed below for reference.    Significant Diagnostic Studies: Mr Brain Wo Contrast  04/28/2014   CLINICAL DATA:  New onset of lower extremity weakness. History of multiple sclerosis. Initial encounter.  EXAM: MRI HEAD WITHOUT CONTRAST  TECHNIQUE: Multiplanar, multiecho pulse sequences of the brain and surrounding structures were obtained without intravenous contrast.  COMPARISON:  None.  FINDINGS: Patient was unable to complete the exam but overall the study is diagnostic.  Widespread areas of mild to moderately restricted diffusion are seen throughout the cerebral, brainstem, and cerebellar white matter. On T2 and FLAIR images, there are concentric zones of demyelination in at least two of these lesions, most notable in the RIGHT parietal periventricular white matter and LEFT posterior frontal subcortical/periventricular white matter. Other significant lesions are seen in the LEFT greater than RIGHT middle cerebellar peduncles, LEFT occipital lobe, and RIGHT centrum semiovale. These areas display varying degrees of diffusion restriction on  DWI sequence. 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  Compared with 2013 there is significant progression of disease. The previous exam showed minor subcentimeter predominantly periventricular lesions, with no restricted diffusion.  IMPRESSION: Widespread areas of restricted diffusion, T2 and FLAIR hyperintensity, and concentric zones of demyelination consistent with fulminant acute MS/Balo Concentric Sclerosis.   Electronically Signed   By: Rolla Flatten M.D.   On: 04/28/2014 14:30    Microbiology: No results found for this or any previous visit (from the past 240 hour(s)).    Labs: Basic Metabolic Panel:  Recent Labs Lab 04/28/14 0504 04/29/14 0421  NA 135 137  K 4.1 4.1  CL 102 108  CO2 22 19  GLUCOSE 101* 132*  BUN 8 7  CREATININE 0.53 0.46*  CALCIUM 9.4 9.3   Liver Function Tests:  Recent Labs Lab 04/28/14 0504  AST 46*  ALT 25  ALKPHOS 43  BILITOT 1.1  PROT 7.8  ALBUMIN 5.6*    Recent Labs Lab 04/28/14 0504  LIPASE 22   No results for input(s): AMMONIA in the last 168 hours. CBC:  Recent Labs Lab 04/28/14 0504 04/29/14 0421 04/30/14 0525  WBC 8.4 5.8 8.1  NEUTROABS 6.3  --   --   HGB 12.0 11.2* 10.6*  HCT 36.5 34.1* 32.1*  MCV 94.6 95.0 94.7  PLT 524* 515* 487*   Cardiac Enzymes:  Recent Labs Lab 04/28/14 0503 04/29/14 1645 05/01/14 0519  CKTOTAL 815* 448* 286*   BNP: BNP (last 3 results) No results for input(s): BNP in the last 8760 hours.  ProBNP (last 3 results) No results for input(s): PROBNP in the last 8760 hours.  CBG:  Recent Labs Lab 05/02/14 1151 05/02/14 1724 05/02/14 2126 05/03/14 0714 05/03/14 1149  GLUCAP 121* 130* 123* 119* 116*       Signed:  Javanni Maring  [facility] 05/03/2014, 1:54 PM

## 2014-05-03 NOTE — Progress Notes (Addendum)
Pt for discharge to [facility].  CSW facilitated pt discharge needs including contacting facility, faxing pt discharge information via [facility], discussing with pt and pt mother at bedside, providing RN phone number to call report, and arranging ambulance transport via [place] for pt to [facility].   Pt coping appropriately with transition to [facility]. Pt and pt mother relieved that pt insurance approved admission to [facility] as pt was unable to go to [name] following hospitalization at [place]. Pt eager to go to rehab in order to get stronger to return home and regain her independence. Pt mother discussed that pt MS medicine was provided to pharmacy from home and pt mother asking for CSW to ensure that medication is given back to pt and pt mother in order for pt and pt mother to provide to [facility]. CSW notified RN. Pt and pt mother appreciative of CSW assistance and support throughout hospitalization.   No further social work needs identified at this time.   CSW signing off.   Alison Murray, MSW, [place] [facility] [phone]

## 2014-05-03 NOTE — Progress Notes (Signed)
Physical Therapy Treatment Patient Details Name: Ann Oconnor MRN: [id number] DOB: 1975-07-19 Today's Date: 05/20/2014    History of Present Illness 39 year old female with history of RRMS, HTN, constipation presenting with progressive worsening of bilateral LE weakness, paresthesias leading to gait instability and falls    PT Comments    Pt reports being tired and fatigued.  Pt able to move LEs better today to perform exercises however continues to require total assist for bed mobility.  Pt hopes to d/c to rehab today.  Follow Up Recommendations  Supervision/Assistance - 24 hour;SNF     Equipment Recommendations  Hospital bed    Recommendations for Other Services       Precautions / Restrictions Precautions Precautions: Fall    Mobility  Bed Mobility Overal bed mobility: Needs Assistance;+2 for physical assistance Bed Mobility: Rolling Rolling: Total assist;+2 for physical assistance         General bed mobility comments: pt able to reach over for rail and only able to assist a little with rolling trunk, continues to need complete assist for lower body, performed twice to each side with pt holding and slowing returning to supine, utilized bed pad to assist, unable to bridge, pt able to sit upright from supine position with mod assist uses UEs to pull [place]  Transfers                    Ambulation/Gait                 Stairs            Wheelchair Mobility    Modified Rankin (Stroke Patients Only)       Balance                                    Cognition Arousal/Alertness: Awake/alert Behavior During Therapy: WFL for tasks assessed/performed Overall Cognitive Status: Within Functional Limits for tasks assessed                      Exercises General Exercises - Lower Extremity Ankle Circles/Pumps: AROM;Both;10 reps Quad Sets: 10 reps;Both;AROM Short Arc Quad: AROM;Both;10 reps Hip ABduction/ADduction:  AAROM;Both;10 reps    General Comments        Pertinent Vitals/Pain Pain Assessment: 0-10 Pain Score: 5  Pain Location: bil groin Pain Descriptors / Indicators: Sore;Discomfort Pain Intervention(s): Limited activity within patient's tolerance;Monitored during session;Repositioned    Home Living                      Prior Function            PT Goals (current goals can now be found in the care plan section) Progress towards PT goals: Progressing toward goals    Frequency  Min 3X/week    PT Plan Frequency needs to be updated    Co-evaluation             End of Session   Activity Tolerance: Patient tolerated treatment well Patient left: in bed;with call bell/phone within reach;with bed alarm set     Time: 4098-1191 PT Time Calculation (min) (ACUTE ONLY): 16 min  Charges:  $Therapeutic Activity: 8-22 mins                    G Codes:      Ann Oconnor,Ann Oconnor May 20, 2014, 12:01 PM Ann Oconnor, PT, DPT  05/03/2014 Pager: [phone]

## 2014-05-04 ENCOUNTER — Telehealth: Payer: Self-pay | Admitting: Neurology

## 2014-05-04 ENCOUNTER — Other Ambulatory Visit: Payer: Self-pay | Admitting: *Deleted

## 2014-05-04 ENCOUNTER — Encounter: Payer: Self-pay | Admitting: Registered Nurse

## 2014-05-04 ENCOUNTER — Non-Acute Institutional Stay (SKILLED_NURSING_FACILITY): Payer: BLUE CROSS/BLUE SHIELD | Admitting: Registered Nurse

## 2014-05-04 DIAGNOSIS — D649 Anemia, unspecified: Secondary | ICD-10-CM | POA: Diagnosis not present

## 2014-05-04 DIAGNOSIS — K5900 Constipation, unspecified: Secondary | ICD-10-CM

## 2014-05-04 DIAGNOSIS — G35 Multiple sclerosis: Secondary | ICD-10-CM | POA: Diagnosis not present

## 2014-05-04 DIAGNOSIS — E559 Vitamin D deficiency, unspecified: Secondary | ICD-10-CM | POA: Diagnosis not present

## 2014-05-04 DIAGNOSIS — I10 Essential (primary) hypertension: Secondary | ICD-10-CM | POA: Diagnosis not present

## 2014-05-04 DIAGNOSIS — F418 Other specified anxiety disorders: Secondary | ICD-10-CM | POA: Diagnosis not present

## 2014-05-04 DIAGNOSIS — R296 Repeated falls: Secondary | ICD-10-CM | POA: Diagnosis not present

## 2014-05-04 MED ORDER — CLONAZEPAM 0.5 MG PO TABS: ORAL_TABLET | ORAL | Status: DC

## 2014-05-04 MED ORDER — HYDROCODONE-ACETAMINOPHEN 5-325 MG PO TABS: 2.0000 | ORAL_TABLET | Freq: Four times a day (QID) | ORAL | Status: DC | PRN

## 2014-05-04 NOTE — Telephone Encounter (Signed)
[facility] 

## 2014-05-04 NOTE — Telephone Encounter (Signed)
Patient is aware of ref to Dr Felecia Shelling

## 2014-05-04 NOTE — Telephone Encounter (Signed)
Pt called/returning your call # [phone]

## 2014-05-04 NOTE — Progress Notes (Signed)
Patient ID: Ann Oconnor, female   DOB: [birth year], 39 y.o.   MRN: [id number]   Place of Service: [facility]  No Known Allergies  Code Status: Full Code  Goals of Care: Longevity/STR  Chief Complaint  Patient presents with  . Hospitalization Follow-up    HPI 39 y.o. female with PMH of MS, HTN, obesity, depression among others is being seen for a follow-up visit post hospital admission from 04/28/14 to 05/03/14 for MS flare. She is here for short term rehabilitation and her goal is to be able to walk again and return home. Since she has not been able to ambulate for past month, this may not be an attainable goal for her. Seen in room today.  Reported pain is adequately controlled with current regimen. Also reported having muscle spasms in BLE. Last BM 2 days ago. Usual bowel pattern at home is every 2 days. Denies any other concerns  Review of Systems Constitutional: Negative for fever and chills HENT: Negative for ear pain, congestion, and sore throat Eyes: Negative for eye pain and visual disturbance  Cardiovascular: Negative for chest pain, palpitations, and leg swelling Respiratory: Negative cough, shortness of breath, and wheezing.  Gastrointestinal: Negative for nausea and vomiting. Negative for abdominal pain. Genitourinary: Negative for dysuria Musculoskeletal: Positive for pain of BLE, L>R  Neurological: Negative for dizziness and headache. Positive for numbness/tingling Skin: Negative for rash and wound.   Psychiatric: Positive for depression. Negative for suicidal ideation.   Past Medical History  Diagnosis Date  . MS (multiple sclerosis) 2002  . LGSIL (low grade squamous intraepithelial dysplasia)     Past Surgical History  Procedure Laterality Date  . Cervical cone biopsy  2008    CIN 2    History  Substance Use Topics  . Smoking status: Never Smoker   . Smokeless tobacco: Never Used  . Alcohol Use: No    Family History  Problem Relation Age  of Onset  . Thyroid disease Mother   . Thyroid disease Sister   . Cancer Paternal Aunt     not sure what kind, maybe cervix  . Thyroid disease Sister       Medication List       This list is accurate as of: 05/04/14  9:14 PM.  Always use your most recent med list.               clonazePAM 0.5 MG tablet  Commonly known as:  KLONOPIN  Take one tablet by mouth every night at bedtime to help rest     Dimethyl Fumarate 240 MG Cpdr  Take 1 capsule by mouth 2 (two) times daily.     gabapentin 400 MG capsule  Commonly known as:  NEURONTIN  Take 1 capsule (400 mg total) by mouth 2 (two) times daily.     HYDROcodone-acetaminophen 5-325 MG per tablet  Commonly known as:  NORCO/VICODIN  Take 2 tablets by mouth every 6 (six) hours as needed for moderate pain.     methocarbamol 500 MG tablet  Commonly known as:  ROBAXIN  Take 500 mg by mouth every 8 (eight) hours as needed for muscle spasms.     PARoxetine 37.5 MG 24 hr tablet  Commonly known as:  PAXIL-CR  TAKE 1 TABLET BY MOUTH DAILY     polyethylene glycol packet  Commonly known as:  MIRALAX / GLYCOLAX  Take 17 g by mouth daily as needed for mild constipation.     sennosides-docusate sodium 8.6-50  MG tablet  Commonly known as:  SENOKOT-S  Take 2 tablets by mouth daily.     valsartan 80 MG tablet  Commonly known as:  DIOVAN  Take 80 mg by mouth daily.     Vitamin D (Ergocalciferol) 50000 UNITS Caps capsule  Commonly known as:  DRISDOL  2 (two) times a week.        Physical Exam  BP 107/69 mmHg  Pulse 95  Temp(Src) 97.5 F (36.4 C)  Resp 18  Ht 5\' 4"  (1.626 m)  Wt 242 lb (109.77 kg)  BMI 41.52 kg/m2  SpO2 97%  LMP 04/18/2014  Constitutional: Obese adult female in no acute distress. Conversant and pleasant HEENT: Normocephalic and atraumatic. PERRL. EOM intact. No scleral icterus. No nasal discharge or sinus tenderness. Oral mucosa moist. Posterior pharynx clear of any exudate or lesions. Teeth and gingiva in  good general condition.  Neck: Supple and nontender. No lymphadenopathy, masses, or thyromegaly. No JVD or carotid bruits. Cardiac: Normal S1, S2. RRR without appreciable murmurs, rubs, or gallops. Distal pulses intact. No dependent edema.  Lungs: No respiratory distress. Breath sounds clear bilaterally without rales, rhonchi, or wheezes. Abdomen: Audible bowel sounds in all quadrants. Soft, nontender, nondistended. Musculoskeletal: able to move BUE. Unable to move BLE-some movement observed but ROM and strength severely limited.   Skin: Warm and dry. No rash noted. No erythema. Bruising noted on BLE  Neurological: Alert and oriented to person, place, and time. No focal deficits.  Psychiatric:  Appropriate mood and affect.   Labs Reviewed  CBC Latest Ref Rng 04/30/2014 04/29/2014 04/28/2014  WBC 4.0 - 10.5 K/uL 8.1 5.8 8.4  Hemoglobin 12.0 - 15.0 g/dL 10.6(L) 11.2(L) 12.0  Hematocrit 36.0 - 46.0 % 32.1(L) 34.1(L) 36.5  Platelets 150 - 400 K/uL 487(H) 515(H) 524(H)    CMP Latest Ref Rng 04/29/2014 04/28/2014 04/21/2014  Glucose 70 - 99 mg/dL 132(H) 101(H) 91  BUN 6 - 23 mg/dL 7 8 8   Creatinine 0.50 - 1.10 mg/dL 0.46(L) 0.53 0.50  Sodium 135 - 145 mmol/L 137 135 142  Potassium 3.5 - 5.1 mmol/L 4.1 4.1 3.6  Chloride 96 - 112 mmol/L 108 102 110  CO2 19 - 32 mmol/L 19 22 22   Calcium 8.4 - 10.5 mg/dL 9.3 9.4 9.0  Total Protein 6.0 - 8.3 g/dL - 7.8 -  Total Bilirubin 0.3 - 1.2 mg/dL - 1.1 -  Alkaline Phos 39 - 117 U/L - 43 -  AST 0 - 37 U/L - 46(H) -  ALT 0 - 35 U/L - 25 -    Lab Results  Component Value Date   TSH 2.871 08/21/2011    Lipid Panel     Component Value Date/Time   CHOL 217* 02/09/2013 1103   TRIG 130 02/09/2013 1103   HDL 45 02/09/2013 1103   CHOLHDL 4.8 02/09/2013 1103   VLDL 26 02/09/2013 1103   LDLCALC 146* 02/09/2013 1103    Diagnostic Studies Reviewed 04/28/14: Brain MRI: Widespread areas of restricted diffusion, T2 and FLAIR hyperintensity, and concentric  zones of demyelination consistent with fulminant acute MS/Balo Concentric Sclerosis.  Assessment & Plan 1. Multiple sclerosis Continue dimethyl fumarate 240mg  twice daily. Continue to work with PT/OT for strength/balance training to maximize functional capacity. Continue norco 5/325mg  2 tabs every 6 hours as needed for pain and gabapentin 400mg  twice daily for neuropathy. Will start robaxin 500mg  every 8 hours as needed for muscle spasms. Continue to f/u with neurology. Fall risk precautions  2. Constipation, unspecified  constipation type Discontinue senna. Start sennakot-s 8.6/50mg  2 tabs daily. Continue miralax 17g daily as needed for moderate constipation. Encourage hydration and monitor  3. Depression with anxiety Continue paxil 37.5mg  daily with clonazepam 0.5mg  daily at bedtime. Continue to monitor for change in mood.   4. Vitamin D deficiency Continue vit D 50,000 units twice a week.   5. Essential hypertension Continue valsartan 80mg  daily and monitor  6. Multiple falls Secondary to MS. Fall risk precautions.   7. Anemia, Normocytic  Recent h&h 10.6/32.1. Continue to monitor h&h   Diagnostic Studies/Labs Ordered: cbc, bmp  Time spent: 40 minutes on care coordination and counseling   Family/Staff Communication Plan of care discussed with patient and nursing staff. Patient and nursing staff verbalized understanding and agree with plan of care. No additional questions or concerns reported.    Arthur Holms, MSN, AGNP-C [facility] [address] [place], [place] [address] [phone] [8am-5pm] After hours: [phone]

## 2014-05-05 ENCOUNTER — Telehealth: Payer: Self-pay | Admitting: Neurology

## 2014-05-05 NOTE — Telephone Encounter (Signed)
Per Susie - Dr. Tomi Likens does not offer the medical treatment sought after by this patient, she has had extensive MS care and Dr. Tomi Likens recommends that she see a neurologist who specializes in [place]. Susie call Dr. Corky Sing office and made them aware of this. They will work on a referral to a MS specialist. Daine Floras has cancelled the NP appt scheduled for 05/06/14 with Dr. Tomi Likens. She tried to call the patient but was unable to reach her / Sherri S.

## 2014-05-06 ENCOUNTER — Other Ambulatory Visit: Payer: Self-pay | Admitting: *Deleted

## 2014-05-06 ENCOUNTER — Ambulatory Visit: Payer: BLUE CROSS/BLUE SHIELD | Admitting: Neurology

## 2014-05-06 ENCOUNTER — Non-Acute Institutional Stay (SKILLED_NURSING_FACILITY): Payer: BLUE CROSS/BLUE SHIELD | Admitting: Internal Medicine

## 2014-05-06 DIAGNOSIS — K5900 Constipation, unspecified: Secondary | ICD-10-CM

## 2014-05-06 DIAGNOSIS — I10 Essential (primary) hypertension: Secondary | ICD-10-CM

## 2014-05-06 DIAGNOSIS — D649 Anemia, unspecified: Secondary | ICD-10-CM | POA: Diagnosis not present

## 2014-05-06 DIAGNOSIS — G35 Multiple sclerosis: Secondary | ICD-10-CM

## 2014-05-06 DIAGNOSIS — M25552 Pain in left hip: Secondary | ICD-10-CM | POA: Diagnosis not present

## 2014-05-06 DIAGNOSIS — R29898 Other symptoms and signs involving the musculoskeletal system: Secondary | ICD-10-CM | POA: Diagnosis not present

## 2014-05-06 DIAGNOSIS — F418 Other specified anxiety disorders: Secondary | ICD-10-CM | POA: Diagnosis not present

## 2014-05-06 MED ORDER — HYDROCODONE-ACETAMINOPHEN 10-325 MG PO TABS
ORAL_TABLET | ORAL | Status: DC
Start: 2014-05-06 — End: 2014-08-18

## 2014-05-06 MED ORDER — HYDROCODONE-ACETAMINOPHEN 5-325 MG PO TABS
ORAL_TABLET | ORAL | Status: DC
Start: 2014-05-06 — End: 2014-08-18

## 2014-05-06 NOTE — Telephone Encounter (Signed)
[facility] 

## 2014-05-06 NOTE — Progress Notes (Signed)
Patient ID: Ann Oconnor, female   DOB: [birth year], 39 y.o.   MRN: [id number]     Facility: [facility]    PCP: Delia Chimes, NP  Code Status: full code  No Known Allergies  Chief Complaint  Patient presents with  . New Admit To SNF     HPI:  39 year old patient is here for short term rehabilitation post hospital admission from 04/28/14 to 05/03/14 for MS flare. She has been having weakness and numbness from below her waist for more than a month. She has complete course of iv solumedrol in the hospital. She has been working with therapy team here and is complaining of severe left lower extremity pain at the hip and groin area. She mentions having this pain from prior to hospital admission with worsening over last few days. She grades the pain as 8-9/10. She is tearful this visit. She would like to be able to walk again. She provides hx of fall few days prior to hospital admission. She has PMH of MS, HTN, obesity, depression.  Review of Systems:  Constitutional: Negative for fever, chills, diaphoresis. Positive for malaise and fatigue HENT: Negative for headache, congestion Eyes: Negative for eye pain, blurred vision, double vision and discharge.  Respiratory: Negative for cough, shortness of breath and wheezing.   Cardiovascular: Negative for chest pain, palpitations, leg swelling.  Gastrointestinal: Negative for heartburn, nausea, vomiting, abdominal pain. Poor appetite and last bowel movement yesterday. Genitourinary: Negative for dysuria, urgency and flank pain.  Musculoskeletal: several falls prior to hospital admission. Skin: Negative for itching, rash.  Neurological: Negative for dizziness. Has lower extremity weakness with numbness and tingling Psychiatric/Behavioral: Negative for memory loss. Positive for depression.   Past Medical History  Diagnosis Date  . MS (multiple sclerosis) 2002  . LGSIL (low grade squamous intraepithelial dysplasia)      Past Surgical History  Procedure Laterality Date  . Cervical cone biopsy  2008    CIN 2   Social History:   reports that she has never smoked. She has never used smokeless tobacco. She reports that she does not drink alcohol or use illicit drugs.  Family History  Problem Relation Age of Onset  . Thyroid disease Mother   . Thyroid disease Sister   . Cancer Paternal Aunt     not sure what kind, maybe cervix  . Thyroid disease Sister     Medications: Patient's Medications  New Prescriptions   No medications on file  Previous Medications   CLONAZEPAM (KLONOPIN) 0.5 MG TABLET    Take one tablet by mouth every night at bedtime to help rest   DIMETHYL FUMARATE 240 MG CPDR    Take 1 capsule by mouth 2 (two) times daily.   GABAPENTIN (NEURONTIN) 400 MG CAPSULE    Take 1 capsule (400 mg total) by mouth 2 (two) times daily.   HYDROCODONE-ACETAMINOPHEN (NORCO) 10-325 MG PER TABLET    Take one tablet by mouth three times daily for right hip pain. Do not exceed 4gm of Tylenol in 24 hours   HYDROCODONE-ACETAMINOPHEN (NORCO/VICODIN) 5-325 MG PER TABLET    Take two tablets by mouth every 4 hours as needed for breakthrough pain. Do not exceed 4gm of Tylenol in 24 hours   METHOCARBAMOL (ROBAXIN) 500 MG TABLET    Take 500 mg by mouth every 8 (eight) hours as needed for muscle spasms.   PAROXETINE (PAXIL-CR) 37.5 MG 24 HR TABLET    TAKE 1 TABLET BY MOUTH DAILY  POLYETHYLENE GLYCOL (MIRALAX / GLYCOLAX) PACKET    Take 17 g by mouth daily as needed for mild constipation.   SENNOSIDES-DOCUSATE SODIUM (SENOKOT-S) 8.6-50 MG TABLET    Take 2 tablets by mouth daily.   VALSARTAN (DIOVAN) 80 MG TABLET    Take 80 mg by mouth daily.   VITAMIN D, ERGOCALCIFEROL, (DRISDOL) 50000 UNITS CAPS CAPSULE    2 (two) times a week.  Modified Medications   No medications on file  Discontinued Medications   No medications on file     Physical Exam: Filed Vitals:   05/06/14 1415  BP: 131/76  Pulse: 80  Temp: 97.9  F (36.6 C)  Resp: 18  SpO2: 98%    General- adult female, obese, in no acute distress Head- normocephalic, atraumatic Throat- moist mucus membrane Eyes- PERRLA, EOMI, no pallor, no icterus, no discharge, normal conjunctiva, normal sclera Neck- no cervical lymphadenopathy Cardiovascular- normal s1,s2, no murmurs, palpable dorsalis pedis and radial pulses, no leg edema Respiratory- bilateral clear to auscultation, no wheeze, no rhonchi, no crackles, no use of accessory muscles Abdomen- bowel sounds present, soft, non tender Musculoskeletal- able to move her upper extremities with strength of 5/5, lower extremity strength 2/5 at hip region and 3/5 at knee region, slight movement in left hip illicits pain, no external rotation noted  Neurological- no new focal deficit Skin- warm and dry Psychiatry- alert and oriented to person, place and time, tearful with flat affect    Labs reviewed: Basic Metabolic Panel:  Recent Labs  04/21/14 1643 04/28/14 0504 04/29/14 0421  NA 142 135 137  K 3.6 4.1 4.1  CL 110 102 108  CO2 22 22 19   GLUCOSE 91 101* 132*  BUN 8 8 7   CREATININE 0.50 0.53 0.46*  CALCIUM 9.0 9.4 9.3   Liver Function Tests:  Recent Labs  04/28/14 0504  AST 46*  ALT 25  ALKPHOS 43  BILITOT 1.1  PROT 7.8  ALBUMIN 5.6*    Recent Labs  04/28/14 0504  LIPASE 22   No results for input(s): AMMONIA in the last 8760 hours. CBC:  Recent Labs  04/21/14 1643 04/28/14 0504 04/29/14 0421 04/30/14 0525  WBC 7.0 8.4 5.8 8.1  NEUTROABS 4.9 6.3  --   --   HGB 10.6* 12.0 11.2* 10.6*  HCT 32.1* 36.5 34.1* 32.1*  MCV 94.1 94.6 95.0 94.7  PLT 331 524* 515* 487*   Cardiac Enzymes:  Recent Labs  04/28/14 0503 04/29/14 1645 05/01/14 0519  CKTOTAL 815* 448* 286*   BNP: Invalid input(s): POCBNP CBG:  Recent Labs  05/02/14 2126 05/03/14 0714 05/03/14 1149  GLUCAP 123* 119* 116*    Assessment/Plan  Lower extremity weakness From her MS flare up. Has  completed her prednisone course. Continue physical therapy and occupational therapy to help with strengthening and gait training. Fall precautions  Left hip pain Severe, limiting movement, get stat pelvis and left femur xray to rule out fracture. No circulation compromised on exam, hold off on therapy until fracture is ruled out. Also change her pain regimen to vicodin 10-325 tid with vicodin 5-325 2 tab q4h prn for breakthrough.   Multiple sclerosis Continue dimethyl fumarate 240mg  twice daily. Continue gabapentin 400mg  twice daily for neuropathy. Continue pain regimen as above with robaxin for muscle spasm. Neurology appointment ASAP for further follow up.   Depression ad anxiety Worsened, tearful today. On paxil at present. Get psychiatry and psychology consult for now to further assess. Continue clonazepam but increase it to 0.5  mg bid for now to help with her nerves  Constipation Continue sennakot-s 8.6/50mg  2 tabs daily with miralax 17g prn, continue hydration  Essential hypertension Continue losartan 50 mg daily and monitor  Anemia, Normocytic  Recent h&h 10.6/32.1. Continue to monitor h&h   Goals of care: short term rehabilitation   Labs/tests ordered: pending cbc, bmp  Family/ staff Communication: reviewed care plan with patient and nursing supervisor    Blanchie Serve, MD  [facility] [phone] (Monday-Friday 8 am - 5 pm) [phone] (afterhours)

## 2014-05-07 ENCOUNTER — Other Ambulatory Visit: Payer: Self-pay | Admitting: *Deleted

## 2014-05-07 MED ORDER — CLONAZEPAM 0.5 MG PO TABS: ORAL_TABLET | ORAL | Status: DC

## 2014-05-07 NOTE — Telephone Encounter (Signed)
[facility] 

## 2014-05-14 ENCOUNTER — Encounter: Payer: Self-pay | Admitting: Neurology

## 2014-05-14 ENCOUNTER — Ambulatory Visit (INDEPENDENT_AMBULATORY_CARE_PROVIDER_SITE_OTHER): Payer: BLUE CROSS/BLUE SHIELD | Admitting: Neurology

## 2014-05-14 ENCOUNTER — Telehealth: Payer: Self-pay | Admitting: *Deleted

## 2014-05-14 VITALS — BP 110/58 | HR 86 | Resp 16 | Ht 64.0 in | Wt 253.0 lb

## 2014-05-14 DIAGNOSIS — R413 Other amnesia: Secondary | ICD-10-CM | POA: Diagnosis not present

## 2014-05-14 DIAGNOSIS — R32 Unspecified urinary incontinence: Secondary | ICD-10-CM | POA: Insufficient documentation

## 2014-05-14 DIAGNOSIS — F418 Other specified anxiety disorders: Secondary | ICD-10-CM

## 2014-05-14 DIAGNOSIS — R29898 Other symptoms and signs involving the musculoskeletal system: Secondary | ICD-10-CM

## 2014-05-14 DIAGNOSIS — N39498 Other specified urinary incontinence: Secondary | ICD-10-CM

## 2014-05-14 DIAGNOSIS — R200 Anesthesia of skin: Secondary | ICD-10-CM

## 2014-05-14 DIAGNOSIS — R208 Other disturbances of skin sensation: Secondary | ICD-10-CM

## 2014-05-14 DIAGNOSIS — Z79899 Other long term (current) drug therapy: Secondary | ICD-10-CM | POA: Insufficient documentation

## 2014-05-14 DIAGNOSIS — G35 Multiple sclerosis: Secondary | ICD-10-CM

## 2014-05-14 NOTE — Telephone Encounter (Signed)
Spoke with mother (Ann Oconnor).  Ann Oconnor needs a stat echo to determine ef, in preparation for starting Novantrone for MS.  I have sched. this with Dr. Mila Palmer office on 05-18-14 arrival time of 11am---address [address]  Phone # [phone].  Ann Oconnor is agreeable with this appt. [name] is working on a stat precert for [employer].  Demo sheeet, ins. info, ov note faxed to Dr. Mila Palmer office fax # [phone]/fim

## 2014-05-14 NOTE — Progress Notes (Signed)
[facility]  PATIENT: Ann Oconnor DOB: 03/18/1975  REFERRING DOCTOR OR PCP:  Delia Chimes SOURCE: Patient, records, images  _________________________________   HISTORICAL  CHIEF COMPLAINT:  Chief Complaint  Patient presents with  . Multiple Sclerosis    Dx. 2002.  Presenting sx. numbness left arm, trunk, left leg, gait and balance disturbance.  She saw pcp and dx. was confirmed with mri.  No lp was done.  She was referred to Dr. Doy Mince here at [facility].  She was initially on Rebif but relapsed on this.  She was then on Rebif and Copaxone both, then just Copaxone.  In 2011, she stopped Copaxone and was enrolled in the [place] study. She stopped Gilenya in Nov. 2015 due to progression of sx. (numbness in feet mainly)  She started Tecfidera which she is   . Gait disturbance    still taking now.  She began having more trouble walking in Jan 2016.  New mri showed new lesions in brain and ? spine. Since then she has been living in a rehab ctr. Today she is in a w/c, sts. she is not able to walk or even wt. bear, even with assistance.  She is tearful, sts. depression has been worse with this last relapse. She also c/o urinary incontinence--has to wear depends all of the time./fim  . Depression  . Urinary Incontinence    HISTORY OF PRESENT ILLNESS:  I had the pleasure seeing you patient, Ann Oconnor, at [facility] for a neurologic consultation regarding her multiple sclerosis.   She presented in 2002 with numbness in her legs and poor gait. She had MRI studies performed. The MRI scan showed MS plaques in the spinal cord and the brain and she was diagnosed with multiple sclerosis. She was initially placed on Rebif. At first she did well with only a couple of small central exacerbations. However, 2003 she had a more severe exacerbation that affected her gait. She was placed on a combination Rebif and Copaxone for a short period of time and then was on  Copaxone monotherapy. She switch to Gilenya in 2011 due to needle fatigue. She had numbness in her feet that would not go away and was switched to Tecfidera from [facility] in November 2015. She switched from Gilenya to Tecfidera without a washout. In January 2016, she lost strength in both legs. Additionally, she had the onset of urinary incontinence. She had a course of IV steroids without any benefit in late January. Because there was no benefit, she was admitted to [facility] for a course of plasmapheresis starting February 5. She got no benefit from the plasmapheresis. During the next week, she was home but she was doing worse and worse. She then presented to [facility]. She was admitted for another week of steroids. An MRI done at admission shows very aggressive MS changes. To complete the MRI with contrast. However, on diffusion-weighted images there are multiple acute foci, several of them fairly large. She was discharged to rehabilitation. She does not think she has had much of an improvement since worsening last month.   She is unable to stand. Additionally she has truncal instability and cannot sit up without support.   The right leg is weaker than the left. She continues to have urinary incontinence.  I personally reviewed her MRI images dated 04/28/2014.  There are multiple infratentorial plaques including left greater than right middle cerebellar peduncles, bilateral pons, and cerebellum. Additionally there are multiple foci in the hemispheres,  some with a concentric demyelinating pattern that could be consistent with Balo's concentric sclerosis, an especially fulminant form of multiple sclerosis. She was unable to complete the examination or get contrast due to the need to terminate the study early.   When compared to an MRI dated 02/07/2012, there has been dramatic change with multiple new brain stem and hemispheric foci.   I also reviewed actual images from MRI of the cervical spine 10/26/2010.  It  shows multiple plaques:  right at the C2 level, left aspect C2-3 level, right aspect C3 level, the posterior central aspect C3 level, upper C6 level slightly greater to the right and C7 level greater to the left.  Abnormal signal within the left aspect of the cord at the T3- T4 level.  Besides the gait inability and the weakness in her legs, she reports numbness. There is numbness in the left arm and both legs. The numbness in the left arm is painful.  She is on hydrocodone 10 mg and gabapentin 400 mg twice a day. He gets some benefit from these medicines still reports a lot of pain. She reports some ataxia in both arms. She reports both bowel and bladder incontinence. At times she will get the sensation of urge but is unable to get to bathroom in time., There will not be any sensation.  She feels much more fatigued in general.  This is both physical and cognitive. This worsens a little bit as the day goes on. She felt a little better after the steroids. She is able to sleep fairly well at night.  She notes a severe depression. She has become tearful frequently. There is also some anxiety. She is on Paxil and has been on that for many years. She is also on clonazepam. Does note cognitive problems that started about 2 months ago. Her sister notes that her cognition became significantly more worse about one month ago, she was worsening after her plasmapheresis.  REVIEW OF SYSTEMS: Constitutional: No fevers, chills, sweats, or change in appetite.  Fatigue Eyes: No visual changes, double vision, eye pain Ear, nose and throat: No hearing loss, ear pain, nasal congestion, sore throat Cardiovascular: No chest pain, palpitations Respiratory: No shortness of breath at rest or with exertion.   No wheezes GastrointestinaI: No nausea, vomiting.  Some fecal incontinence Genitourinary: as above. Musculoskeletal: No neck pain, back pain Integumentary: No rash, pruritus, skin lesions Neurological: as  above Psychiatric: Notes depression at this time.  Some anxiety Endocrine: No palpitations, diaphoresis, change in appetite, change in weigh or increased thirst Hematologic/Lymphatic: No anemia, purpura, petechiae. Allergic/Immunologic: No itchy/runny eyes, nasal congestion, recent allergic reactions, rashes  ALLERGIES: No Known Allergies  HOME MEDICATIONS:  Current outpatient prescriptions:  .  clonazePAM (KLONOPIN) 0.5 MG tablet, Take one tablet by mouth twice daily for anxiety. Hold for sedation, Disp: 60 tablet, Rfl: 5 .  Dimethyl Fumarate 240 MG CPDR, Take 1 capsule by mouth 2 (two) times daily., Disp: , Rfl:  .  gabapentin (NEURONTIN) 400 MG capsule, Take 1 capsule (400 mg total) by mouth 2 (two) times daily., Disp: 60 capsule, Rfl: 3 .  HYDROcodone-acetaminophen (NORCO) 10-325 MG per tablet, Take one tablet by mouth three times daily for right hip pain. Do not exceed 4gm of Tylenol in 24 hours, Disp: 90 tablet, Rfl: 0 .  HYDROcodone-acetaminophen (NORCO/VICODIN) 5-325 MG per tablet, Take two tablets by mouth every 4 hours as needed for breakthrough pain. Do not exceed 4gm of Tylenol in 24 hours, Disp: 240  tablet, Rfl: 0 .  methocarbamol (ROBAXIN) 500 MG tablet, Take 500 mg by mouth every 8 (eight) hours as needed for muscle spasms., Disp: , Rfl:  .  PARoxetine (PAXIL-CR) 37.5 MG 24 hr tablet, TAKE 1 TABLET BY MOUTH DAILY, Disp: 15 tablet, Rfl: 0 .  polyethylene glycol (MIRALAX / GLYCOLAX) packet, Take 17 g by mouth daily as needed for mild constipation., Disp: 14 each, Rfl: 0 .  sennosides-docusate sodium (SENOKOT-S) 8.6-50 MG tablet, Take 2 tablets by mouth daily., Disp: , Rfl:  .  valsartan (DIOVAN) 80 MG tablet, Take 80 mg by mouth daily., Disp: , Rfl:  .  Vitamin D, Ergocalciferol, (DRISDOL) 50000 UNITS CAPS capsule, 2 (two) times a week., Disp: , Rfl: 3  PAST MEDICAL HISTORY: Past Medical History  Diagnosis Date  . MS (multiple sclerosis) 2002  . LGSIL (low grade squamous  intraepithelial dysplasia)   . Movement disorder   . Vision abnormalities     PAST SURGICAL HISTORY: Past Surgical History  Procedure Laterality Date  . Cervical cone biopsy  2008    CIN 2    FAMILY HISTORY: Family History  Problem Relation Age of Onset  . Thyroid disease Mother   . Thyroid disease Sister   . Cancer Paternal Aunt     not sure what kind, maybe cervix  . Thyroid disease Sister   . Heart disease Father   . Mitral valve prolapse Father     SOCIAL HISTORY:  History   Social History  . Marital Status: Single    Spouse Name: N/A  . Number of Children: N/A  . Years of Education: N/A   Occupational History  . Not on file.   Social History Main Topics  . Smoking status: Never Smoker   . Smokeless tobacco: Never Used  . Alcohol Use: No  . Drug Use: No  . Sexual Activity: No   Other Topics Concern  . Not on file   Social History Narrative     PHYSICAL EXAM  Filed Vitals:   05/14/14 0847  BP: 110/58  Pulse: 86  Resp: 16  Height: 5\' 4"  (1.626 m)  Weight: 253 lb (114.76 kg)    Body mass index is 43.41 kg/(m^2).   General: The patient is well-developed and well-nourished and in no acute distress  Eyes:  Funduscopic exam shows normal optic discs and retinal vessels.  Neck: The neck is supple, no carotid bruits are noted.  The neck is nontender.  Cardiovascular: The heart has a regular rate and rhythm with a normal S1 and S2. There were no murmurs, gallops or rubs. Lungs are clear to auscultation.  Skin: Extremities are without significant edema.  Musculoskeletal:  Back is nontender  Neurologic Exam  Mental status: The patient is alert and oriented x 3 at the time of the examination. The patient has apparent normal recent and remote memory, but reduced attention span and concentration ability.   Speech is normal.   She was tearful twice.   Cranial nerves: Extraocular movements are full. Pupils are equal, round, and reactive to light and  accomodation.  Visual fields are full.  Facial symmetry is present. There is good facial sensation to soft touch bilaterally.Facial strength is normal.  Trapezius and sternocleidomastoid strength is normal. No dysarthria is noted.  The tongue is midline, and the patient has symmetric elevation of the soft palate. No obvious hearing deficits are noted.  Motor:  Muscle bulk is normal.   Tone is increased in right arm >  R leg > left leg. Strength is  4 to 4+/5 in right arm, 4++/5, left arm.   2 right and 2+ left hip flexors and 2 right and 3 left quads  Sensory: Sensory testing is intact to pinprick, soft touch and vibration sensation in all 4 extremities.  Coordination: Cerebellar testing reveals reduced finger-nose-finger and RAM, worse on right.  Unable to heel-to-shin bilaterally.  Gait and station: She is unable to stand.   Trunk is unstable.   Reflexes: Deep tendon reflexes are increased bilaterally, slightly more on the right.  Plantar responses are extensor on the right.    DIAGNOSTIC DATA (LABS, IMAGING, TESTING) - I reviewed patient records, labs, notes, testing and imaging myself where available.  Lab Results  Component Value Date   WBC 8.1 04/30/2014   HGB 10.6* 04/30/2014   HCT 32.1* 04/30/2014   MCV 94.7 04/30/2014   PLT 487* 04/30/2014      Component Value Date/Time   NA 137 04/29/2014 0421   K 4.1 04/29/2014 0421   CL 108 04/29/2014 0421   CO2 19 04/29/2014 0421   GLUCOSE 132* 04/29/2014 0421   BUN 7 04/29/2014 0421   CREATININE 0.46* 04/29/2014 0421   CREATININE 0.75 09/02/2012 1057   CALCIUM 9.3 04/29/2014 0421   PROT 7.8 04/28/2014 0504   ALBUMIN 5.6* 04/28/2014 0504   AST 46* 04/28/2014 0504   ALT 25 04/28/2014 0504   ALKPHOS 43 04/28/2014 0504   BILITOT 1.1 04/28/2014 0504   GFRNONAA >90 04/29/2014 0421   GFRAA >90 04/29/2014 0421   Lab Results  Component Value Date   CHOL 217* 02/09/2013   HDL 45 02/09/2013   LDLCALC 146* 02/09/2013   TRIG 130  02/09/2013   CHOLHDL 4.8 02/09/2013   No results found for: HGBA1C Lab Results  Component Value Date   VITAMINB12 302 09/14/2008   Lab Results  Component Value Date   TSH 2.871 08/21/2011       ASSESSMENT AND PLAN  Multiple sclerosis - Plan: 2D Echocardiogram without contrast, MR Brain W Wo Contrast  Numbness of both lower extremities - Plan: MR Brain W Wo Contrast  Weakness of lower extremity, unspecified laterality - Plan: MR Brain W Wo Contrast  High risk medication use - Plan: 2D Echocardiogram without contrast, MR Brain W Wo Contrast  Depression with anxiety  Memory loss  Other urinary incontinence   In summary, Carlo Mikal Plane is a 39 year old woman with an extremely aggressive form of multiple sclerosis. It is uncertain if she has a fulminant relapsing remitting multiple sclerosis or a superimposed balo's concentric sclerosis as the MRI scan suggest a layering effect of 2 of the larger lesions. Of note, her worsening started bouts 6-8 weeks after stopping Gilenya and starting Tecfidera. There could also be an effect of the Gilenya withdrawal. Regardless, she has an extremely aggressive MS and we need to change therapy.     I discussed with her the pros and cons of Tysabri, Lemtrada, Novantrone and Rituxan. There is nothing in the literature about Tysabri or Rituxan and Balo's sclerosis and there is one report of Lemtrada not having any benefit.  I found a couple reports of Novantrone having possible benefit. Therefore think that will be our best course of action as it has a reasonable likelihood of helping her with her she has fulminant MS or a Balo's concentric sclerosis.  I discussed with her that this medicine is associated with a higher risk of congestive heart failure he would need to  obtain an echo before each dose. Additionally, there has been a dose dependent to higher risk of leukemia. For that reason, we will probably do just 4 or 5 doses. Based on her weight and  height and BSA = 2.28, I'll order 27 mg and however premedicated with Zofran and Tylenol.  If the dysesthesia worsens, consider increasing gabapentin or adding lamotrigine.  We have her scheduled again echocardiogram next week and will try to get the infusions as soon as possible after that. She will return to see me a couple weeks after her infusion, or sooner if she has new or worsening symptoms.   Ann Oconnor A. Felecia Shelling, MD, PhD 1/88/4166, 0:63 AM Certified in Neurology, Clinical Neurophysiology, Sleep Medicine, Pain Medicine and Neuroimaging  [facility] [address] [place], [place] [address] [phone]  BSA = 2.28  Dose 12 mg/M2 = 27.3

## 2014-05-17 LAB — BASIC METABOLIC PANEL
BUN: 7 mg/dL (ref 4–21)
Creatinine: 0.5 mg/dL (ref 0.5–1.1)
Glucose: 85 mg/dL
Potassium: 4.2 mmol/L (ref 3.4–5.3)
Sodium: 141 mmol/L (ref 137–147)

## 2014-05-19 ENCOUNTER — Telehealth: Payer: Self-pay | Admitting: *Deleted

## 2014-05-19 NOTE — Telephone Encounter (Signed)
Form from [facility] on [employer].

## 2014-05-19 NOTE — Telephone Encounter (Signed)
Spoke with Dee/fim

## 2014-05-20 ENCOUNTER — Other Ambulatory Visit: Payer: BLUE CROSS/BLUE SHIELD

## 2014-05-20 DIAGNOSIS — Z0289 Encounter for other administrative examinations: Secondary | ICD-10-CM

## 2014-05-20 DIAGNOSIS — G35 Multiple sclerosis: Secondary | ICD-10-CM | POA: Diagnosis not present

## 2014-05-21 ENCOUNTER — Encounter (HOSPITAL_COMMUNITY)
Admission: RE | Admit: 2014-05-21 | Discharge: 2014-05-21 | Disposition: A | Discharge status: Home / Self Care | Payer: BLUE CROSS/BLUE SHIELD | Source: Ambulatory Visit | Attending: Neurology | Admitting: Neurology

## 2014-05-21 ENCOUNTER — Other Ambulatory Visit (HOSPITAL_COMMUNITY): Payer: Self-pay | Admitting: Neurology

## 2014-05-21 ENCOUNTER — Encounter (HOSPITAL_COMMUNITY): Payer: Self-pay

## 2014-05-21 DIAGNOSIS — Z79899 Other long term (current) drug therapy: Secondary | ICD-10-CM

## 2014-05-21 DIAGNOSIS — G35 Multiple sclerosis: Secondary | ICD-10-CM | POA: Diagnosis not present

## 2014-05-21 DIAGNOSIS — R200 Anesthesia of skin: Secondary | ICD-10-CM

## 2014-05-21 DIAGNOSIS — R29898 Other symptoms and signs involving the musculoskeletal system: Secondary | ICD-10-CM

## 2014-05-21 LAB — CBC WITH DIFFERENTIAL/PLATELET
Basophils Absolute: 0.1 10*3/uL (ref 0.0–0.1)
Basophils Relative: 1 % (ref 0–1)
EOS ABS: 0.1 10*3/uL (ref 0.0–0.7)
EOS PCT: 1 % (ref 0–5)
HCT: 34.9 % — ABNORMAL LOW (ref 36.0–46.0)
Hemoglobin: 11.5 g/dL — ABNORMAL LOW (ref 12.0–15.0)
LYMPHS ABS: 0.6 10*3/uL — AB (ref 0.7–4.0)
Lymphocytes Relative: 13 % (ref 12–46)
MCH: 31.1 pg (ref 26.0–34.0)
MCHC: 33 g/dL (ref 30.0–36.0)
MCV: 94.3 fL (ref 78.0–100.0)
Monocytes Absolute: 0.3 10*3/uL (ref 0.1–1.0)
Monocytes Relative: 5 % (ref 3–12)
NEUTROS PCT: 80 % — AB (ref 43–77)
Neutro Abs: 4 10*3/uL (ref 1.7–7.7)
PLATELETS: 305 10*3/uL (ref 150–400)
RBC: 3.7 MIL/uL — AB (ref 3.87–5.11)
RDW: 15.3 % (ref 11.5–15.5)
WBC: 5 10*3/uL (ref 4.0–10.5)

## 2014-05-21 MED ORDER — ACETAMINOPHEN 325 MG PO TABS
650.0000 mg | ORAL_TABLET | ORAL | Status: AC
  Administered 2014-05-21: 650 mg via ORAL
  Filled 2014-05-21: qty 2

## 2014-05-21 MED ORDER — SODIUM CHLORIDE 0.9 % IV SOLN
27.0000 mg | INTRAVENOUS | Status: AC
  Administered 2014-05-21: 28 mg via INTRAVENOUS
  Filled 2014-05-21: qty 14

## 2014-05-21 MED ORDER — ONDANSETRON 8 MG/NS 50 ML IVPB
8.0000 mg | INTRAVENOUS | Status: AC
  Administered 2014-05-21: 8 mg via INTRAVENOUS
  Filled 2014-05-21: qty 8

## 2014-05-21 MED ORDER — SODIUM CHLORIDE 0.9 % IV SOLN
INTRAVENOUS | Status: AC
  Administered 2014-05-21: 11:00:00 via INTRAVENOUS

## 2014-05-21 MED ORDER — ONDANSETRON 8 MG/NS 50 ML IVPB
8.0000 mg | Freq: Once | INTRAVENOUS | Status: AC | PRN
Start: 2014-05-21 — End: 2014-05-21
  Administered 2014-05-21: 8 mg via INTRAVENOUS
  Filled 2014-05-21: qty 8

## 2014-05-21 NOTE — Discharge Instructions (Signed)
ATTENTION NURSES AT [facility]:  Ann Oconnor RECEIVED:  ZOFRAN 8 MG IV AT 1108 AND 1230   TYLENOL 650 MG PO THEN HER NOVANTRONE INFUSION (SEE INFORMATION BELOW) INFUSION WAS UNEVENTFUL EXCEPT FOR SOME DIFFICULTY WITH IV ACCESS  SEE ATTACHED SCHEDULE FOR HER NEXT INFUSION AT [facility] SHORT STAY IN 3 MONTHS  Urine might be a bluish green color x 2 days. Encourage hydration Sclera might also have a bluish tint for 2 days Medicate if patient has any nausea You might see some hair loss, Pt is aware of this NOVANTRONE Mitoxantrone injection What is this medicine? MITOXANTRONE (MYE toe ZAN trone) is a chemotherapy drug. It targets fast dividing cells, like cancer cells, and causes these cells to die. This medicine is used to treat acute nonlymphocytic leukemia (ANLL) and advanced prostate cancer. It is also used to treat certain types of multiple sclerosis. This medicine may be used for other purposes; ask your health care provider or pharmacist if you have questions. COMMON BRAND NAME(S): Novantrone What should I tell my health care provider before I take this medicine? They need to know if you have any of these conditions: -heart disease -infection (especially virus infection such as chickenpox or herpes) -liver disease -low blood counts, like low platelets, red blood cells, white blood cells -previous chemotherapy, especially with doxorubicin, daunorubicin, epirubicin, or idarubicin -recent or ongoing radiation therapy -an unusual or allergic reaction to mitoxantrone, other medicines, foods, dyes, or preservatives -pregnant or are trying to get pregnant -breast-feeding How should I use this medicine? This drug is given as an infusion into a vein. It is administered in a hospital or clinic by a specially trained health care professional. If you have pain, swelling, burning or any unusual feeling around the site of your injection, tell your health care professional right away. Talk to your  pediatrician regarding the use of this medicine in children. Special care may be needed. Overdosage: If you think you have taken too much of this medicine contact a poison control center or emergency room at once. NOTE: This medicine is only for you. Do not share this medicine with others. What if I miss a dose? It is important not to miss your dose. Call your doctor or health care professional if you are unable to keep an appointment. What may interact with this medicine? -ciprofloxacin -cyclosporine -medicines to increase blood counts like filgrastim, pegfilgrastim, sargramostim -other chemotherapy drugs like daunorubicin, doxorubicin, epirubicin, idarubicin, trastuzumab -vaccines Talk to your doctor or health care professional before taking any of these medicines: -acetaminophen -aspirin -ibuprofen -ketoprofen -naproxen This list may not describe all possible interactions. Give your health care provider a list of all the medicines, herbs, non-prescription drugs, or dietary supplements you use. Also tell them if you smoke, drink alcohol, or use illegal drugs. Some items may interact with your medicine. What should I watch for while using this medicine? Your condition will be monitored carefully while you are receiving this medicine. You will need important blood work done while you are taking this medicine. This drug may make you feel generally unwell. This is not uncommon, as chemotherapy can affect healthy cells as well as cancer cells. Report any side effects. Continue your course of treatment even though you feel ill unless your doctor tells you to stop. Call your doctor or health care professional for advice if you get a fever, chills or sore throat, or other symptoms of a cold or flu. Do not treat yourself. This drug decreases your body's  ability to fight infections. Try to avoid being around people who are sick. This medicine may increase your risk to bruise or bleed. Call your doctor  or health care professional if you notice any unusual bleeding. Be careful brushing and flossing your teeth or using a toothpick because you may get an infection or bleed more easily. If you have any dental work done, tell your dentist you are receiving this medicine. Avoid taking products that contain aspirin, acetaminophen, ibuprofen, naproxen, or ketoprofen unless instructed by your doctor. These medicines may hide a fever. Your urine may turn blue-green for a few days after your dose. This is normal with this medicine. Do not become pregnant while taking this medicine. Women should inform their doctor if they wish to become pregnant or think they might be pregnant. There is a potential for serious side effects to an unborn child. Take a pregnancy test as directed before each dose of this medicine. Talk to your health care professional or pharmacist for more information. Do not breast-feed an infant while taking this medicine. There is a maximum amount of this medicine you should receive throughout your life. The amount depends on the medical condition being treated and your overall health. Your doctor will watch how much of this medicine you receive in your lifetime. Tell your doctor if you have taken this medicine before. What side effects may I notice from receiving this medicine? Side effects that you should report to your doctor or health care professional as soon as possible: -allergic reactions like skin rash, itching or hives, swelling of the face, lips, or tongue -low blood counts - this medicine may decrease the number of white blood cells, red blood cells and platelets. You may be at increased risk for infections and bleeding. -signs of infection - fever or chills, cough, sore throat, pain or difficulty passing urine -signs of decreased platelets or bleeding - bruising, pinpoint red spots on the skin, black, tarry stools, blood in the urine -signs of decreased red blood cells - unusually  weak or tired, fainting spells, lightheadedness -breathing problems -changes in vision -chest pain -fast, irregular heartbeat -mouth sores -nausea, vomiting -pain, swelling, redness at site where injected -swelling of the ankles, feet, hands -yellowing of the eyes or skin Side effects that usually do not require medical attention (report to your doctor or health care professional if they continue or are bothersome): -blue color in the whites of your eyes -constipation -diarrhea -hair loss -loss of appetite -missed menstrual periods -nail discoloration or damage -stomach upset This list may not describe all possible side effects. Call your doctor for medical advice about side effects. You may report side effects to FDA at 1-800-FDA-1088. Where should I keep my medicine? This drug is given in a hospital or clinic and will not be stored at home. NOTE: This sheet is a summary. It may not cover all possible information. If you have questions about this medicine, talk to your doctor, pharmacist, or health care provider.  2015, Elsevier/Gold Standard. (2012-06-17 11:52:56) Multiple Sclerosis Multiple sclerosis (MS) is a disease of the central nervous system. It leads to the loss of the insulating covering of the nerves (myelin sheath) of your brain. When this happens, brain signals do not get sent properly or may not get sent at all. The age of onset of MS varies.  CAUSES The cause of MS is unknown. However, it is more common in the [place] than in the [place]. RISK FACTORS There  is a higher number of women with MS than men. MS is not an illness that is passed down to you from your family members (inherited). However, your risk of MS is higher if you have a relative with MS. SIGNS AND SYMPTOMS  The symptoms of MS occur in episodes or attacks. These attacks may last weeks to months. There may be long periods of almost no symptoms between attacks. The symptoms of  MS vary. This is because of the many different ways it affects the central nervous system. The main symptoms of MS include:  Vision problems and eye pain.  Numbness.  Weakness.  Inability to move your arms, hands, feet, or legs (paralysis).  Balance problems.  Tremors. DIAGNOSIS  Your health care provider can diagnose MS with the help of imaging exams and lab tests. These may include specialized X-ray exams and spinal fluid tests. The best imaging exam to confirm a diagnosis of MS is an MRI. TREATMENT  There is no known cure for MS, but there are medicines that can decrease the number and frequency of attacks. Steroids are often used for short-term relief. Physical and occupational therapy may also help. There are also many new alternative or complementary treatments available to help control the symptoms of MS. Ask your health care provider if any of these other options are right for you. HOME CARE INSTRUCTIONS   Take medicines as directed by your health care provider.  Exercise as directed by your health care provider. SEEK MEDICAL CARE IF: You begin to feel depressed. SEEK IMMEDIATE MEDICAL CARE IF:  You develop paralysis.  You have problems with bladder, bowel, or sexual function.  You develop mental changes, such as forgetfulness or mood swings.  You have a period of uncontrolled movements (seizure). Document Released: 02/17/2000 Document Revised: 02/24/2013 Document Reviewed: 10/27/2012 [facility] Patient Information 2015 [place], [place]. This information is not intended to replace advice given to you by your health care provider. Make sure you discuss any questions you have with your health care provider.

## 2014-05-24 ENCOUNTER — Telehealth: Payer: Self-pay | Admitting: *Deleted

## 2014-05-24 NOTE — Telephone Encounter (Signed)
Left message also for Ann Oconnor to call with an update on how she feels since Novantrone infusion/fim

## 2014-05-24 NOTE — Telephone Encounter (Signed)
Spoke with mom June who sts. Ann Oconnor had nausea after Novantrone infusion, but this improved.  She is still quite fatigued.  F/U appt. given with RAS 07-06-14 at 0940.  Will discuss possible need for portacath at that time.  I will check on [name] again next week./fim

## 2014-05-24 NOTE — Telephone Encounter (Signed)
Mother is returning Ann Oconnor's call, would like a call back please.

## 2014-05-24 NOTE — Telephone Encounter (Signed)
Ann Oconnor had her first Novantrone inufusion Friday, 05-21-14.  LMOM for Ann Oconnor to call with an update./fim

## 2014-05-26 ENCOUNTER — Telehealth: Payer: Self-pay | Admitting: *Deleted

## 2014-05-26 NOTE — Telephone Encounter (Signed)
Patient form from [place] copy at the front desk for pick up

## 2014-05-31 NOTE — Telephone Encounter (Signed)
Dr. Felecia Shelling is aware of below update.  No orders received at this time./fim

## 2014-05-31 NOTE — Telephone Encounter (Signed)
Spoke with June.  She sts. Shavell is showing good  improvement--sts. her speech is almost totally clear now (it was slurred before).  Sts. she is getting stronger--can sit with minimal assistance now.  Fatigue is improved, and she is also showing improvement in mood.  She is transferring back home from the rehab ctr. this week/fim

## 2014-06-01 ENCOUNTER — Encounter: Payer: Self-pay | Admitting: Registered Nurse

## 2014-06-01 ENCOUNTER — Non-Acute Institutional Stay (SKILLED_NURSING_FACILITY): Payer: BLUE CROSS/BLUE SHIELD | Admitting: Registered Nurse

## 2014-06-01 DIAGNOSIS — R296 Repeated falls: Secondary | ICD-10-CM

## 2014-06-01 DIAGNOSIS — F418 Other specified anxiety disorders: Secondary | ICD-10-CM

## 2014-06-01 DIAGNOSIS — E559 Vitamin D deficiency, unspecified: Secondary | ICD-10-CM | POA: Diagnosis not present

## 2014-06-01 DIAGNOSIS — I10 Essential (primary) hypertension: Secondary | ICD-10-CM | POA: Diagnosis not present

## 2014-06-01 DIAGNOSIS — G35 Multiple sclerosis: Secondary | ICD-10-CM

## 2014-06-01 DIAGNOSIS — D649 Anemia, unspecified: Secondary | ICD-10-CM | POA: Diagnosis not present

## 2014-06-01 DIAGNOSIS — K5900 Constipation, unspecified: Secondary | ICD-10-CM

## 2014-06-01 NOTE — Progress Notes (Signed)
Patient ID: Ann Oconnor, female   DOB: 1975/08/16, 39 y.o.   MRN: [id number]   Place of Service: [facility]  No Known Allergies  Code Status: Full Code  Goals of Care: Longevity/STR  Chief Complaint  Patient presents with  . Discharge Note    HPI 39 y.o. female with PMH of MS, HTN, obesity, depression among others is being seen for a discharge visit. She was here for short term rehabilitation post hospital admission from 04/28/14 to 05/03/14 for MS flare. She has met her goals with therapy team and is ready to be discharged home with [facility] PT/OT and DME (high back/reclining w/c with cushion 20x16, Hoyer lift, and Hospital bed). Seen in room today. Reported pain is adequately controlled with current regimen. Still has some issues with constipation. Denies any other concerns.   Review of Systems Constitutional: Negative for fever and chills HENT: Negative for ear pain, congestion, and sore throat Eyes: Negative for eye pain and visual disturbance  Cardiovascular: Negative for chest pain, palpitations, and leg swelling Respiratory: Negative cough, shortness of breath, and wheezing.  Gastrointestinal: Negative for nausea and vomiting. Negative for abdominal pain. Positive for constipation Genitourinary: Negative for dysuria Musculoskeletal: Positive Right leg pain Neurological: Negative for dizziness and headache.  Skin: Negative for rash and wound.   Psychiatric: Positive for depression. Negative for suicidal ideation.   Past Medical History  Diagnosis Date  . MS (multiple sclerosis) 2002  . LGSIL (low grade squamous intraepithelial dysplasia)   . Movement disorder   . Vision abnormalities     Past Surgical History  Procedure Laterality Date  . Cervical cone biopsy  2008    CIN 2    History  Substance Use Topics  . Smoking status: Never Smoker   . Smokeless tobacco: Never Used  . Alcohol Use: No    Family History  Problem Relation Age of Onset  . Thyroid  disease Mother   . Thyroid disease Sister   . Cancer Paternal Aunt     not sure what kind, maybe cervix  . Thyroid disease Sister   . Heart disease Father   . Mitral valve prolapse Father       Medication List       This list is accurate as of: 06/01/14  1:02 PM.  Always use your most recent med list.               clonazePAM 0.5 MG tablet  Commonly known as:  KLONOPIN  Take one tablet by mouth twice daily for anxiety. Hold for sedation     gabapentin 400 MG capsule  Commonly known as:  NEURONTIN  Take 1 capsule (400 mg total) by mouth 2 (two) times daily.     HYDROcodone-acetaminophen 5-325 MG per tablet  Commonly known as:  NORCO/VICODIN  Take two tablets by mouth every 4 hours as needed for breakthrough pain. Do not exceed 4gm of Tylenol in 24 hours     HYDROcodone-acetaminophen 10-325 MG per tablet  Commonly known as:  NORCO  Take one tablet by mouth three times daily for right hip pain. Do not exceed 4gm of Tylenol in 24 hours     methocarbamol 500 MG tablet  Commonly known as:  ROBAXIN  Take 500 mg by mouth every 8 (eight) hours as needed for muscle spasms.     NOVANTRONE IV  Inject 27 mg into the vein every 3 (three) months. At Short Stay at [facility]  PARoxetine 37.5 MG 24 hr tablet  Commonly known as:  PAXIL-CR  TAKE 1 TABLET BY MOUTH DAILY     polyethylene glycol packet  Commonly known as:  MIRALAX / GLYCOLAX  Take 17 g by mouth daily as needed for mild constipation.     sennosides-docusate sodium 8.6-50 MG tablet  Commonly known as:  SENOKOT-S  Take 2 tablets by mouth daily.     valsartan 80 MG tablet  Commonly known as:  DIOVAN  Take 80 mg by mouth daily.     Vitamin D (Ergocalciferol) 50000 UNITS Caps capsule  Commonly known as:  DRISDOL  2 (two) times a week.        Physical Exam  BP 118/75 mmHg  Pulse 86  Temp(Src) 97.5 F (36.4 C)  Resp 18  Ht _0  (1.626 m)  Wt 236 lb 3.2 oz (107.14 kg)  BMI 40.52 kg/m2  SpO2 97%  LMP  05/14/2014  Constitutional: Obese adult female in no acute distress. Conversant and pleasant HEENT: Normocephalic and atraumatic. PERRL. EOM intact. No scleral icterus. No nasal discharge or sinus tenderness. Oral mucosa moist. Posterior pharynx clear of any exudate or lesions. Teeth and gingiva in good general condition.  Neck: Supple and nontender. No lymphadenopathy, masses, or thyromegaly. No JVD or carotid bruits. Cardiac: Normal S1, S2. RRR without appreciable murmurs, rubs, or gallops. Distal pulses intact. No dependent edema.  Lungs: No respiratory distress. Breath sounds clear bilaterally without rales, rhonchi, or wheezes. Abdomen: Audible bowel sounds in all quadrants. Soft, nontender, nondistended. Musculoskeletal: able to move BUE. Paraglegia Skin: Warm and dry. No rash noted. No erythema. Bruising noted on BLE  Neurological: Alert and oriented to person, place, and time. No focal deficits.  Psychiatric:  Appropriate mood and affect.   Labs Reviewed  CBC Latest Ref Rng 05/21/2014 04/30/2014 04/29/2014  WBC 4.0 - 10.5 K/uL 5.0 8.1 5.8  Hemoglobin 12.0 - 15.0 g/dL 11.5(L) 10.6(L) 11.2(L)  Hematocrit 36.0 - 46.0 % 34.9(L) 32.1(L) 34.1(L)  Platelets 150 - 400 K/uL 305 487(H) 515(H)    CMP Latest Ref Rng 05/17/2014 04/29/2014 04/28/2014  Glucose 70 - 99 mg/dL - 132(H) 101(H)  BUN 4 - 21 mg/dL _1 Creatinine 0.5 - 1.1 mg/dL 0.5 0.46(L) 0.53  Sodium 137 - 147 mmol/L 141 137 135  Potassium 3.4 - 5.3 mmol/L 4.2 4.1 4.1  Chloride 96 - 112 mmol/L - 108 102  CO2 19 - 32 mmol/L - 19 22  Calcium 8.4 - 10.5 mg/dL - 9.3 9.4  Total Protein 6.0 - 8.3 g/dL - - 7.8  Total Bilirubin 0.3 - 1.2 mg/dL - - 1.1  Alkaline Phos 39 - 117 U/L - - 43  AST 0 - 37 U/L - - 46(H)  ALT 0 - 35 U/L - - 25    Lab Results  Component Value Date   TSH 2.871 08/21/2011    Lipid Panel     Component Value Date/Time   CHOL 217* 02/09/2013 1103   TRIG 130 02/09/2013 1103   HDL 45 02/09/2013 1103    CHOLHDL 4.8 02/09/2013 1103   VLDL 26 02/09/2013 1103   LDLCALC 146* 02/09/2013 1103    Diagnostic Studies Reviewed 04/28/14: Brain MRI: Widespread areas of restricted diffusion, T2 and FLAIR hyperintensity, and concentric zones of demyelination consistent with fulminant acute MS/Balo Concentric Sclerosis.  Assessment & Plan 1. Multiple sclerosis w/ paraplegia Continue NOVANTRONE infusion every 3 months. Continue to work with PT/OT for strength/balance training to maximize function.  Continue norco 10/338m three times daily with additional 2 tabs of 5/3266mevery four as as needed for breakthrough pain (Do not exceed 4g of tylenol in 24hr) with robaxin 50098mvery 8 hours as needed for muscle spasms. Continue gabapentin 400m26mice daily for neuropathy. Continue to f/u with neurology. Fall risk precautions  2. Constipation, unspecified constipation type Continue sennakot-s 8.6/50mg 2 tabs daily. Continue miralax 17g daily as needed for moderate constipation. Encourage hydration   3. Depression with anxiety Mood stable. Continue paxil 37.5mg 40mly with clonazepam 0.5mg t18me daily.  Continue to f/u with PCP  4. Vitamin D deficiency Continue vit D 50,000 units twice a week.   5. Essential hypertension Controlled. Continue valsartan 80mg d24m and f/u with PCP  6. Multiple falls Secondary to MS. Continue to fall risk precautions.   7. Anemia, Normocytic  Stable.Last h&h 11.5/34.9. PCP to monitor h&h  Home health services: PT/OT DME required: High back/reclining wheelchair with cushion 20x16, Hoyer lift, and Hospital bed PCP follow-up: Dr. Susan FDelia Chimes2/16 at 10am 30-day supply of prescription medications provided (#45 Norco 10/325mg an48m5 Norco 5/325mg)   65m spent: 40 minutes on care coordination and counseling   Family/Staff Communication Plan of care discussed with patient and nursing staff. Patient and nursing staff verbalized understanding and agree with plan of  care. No additional questions or concerns reported.    Zekiel Torian NguyeArthur HolmsNP-C [id number] ([phone]] After hours: [phone]

## 2014-06-03 ENCOUNTER — Telehealth: Payer: Self-pay | Admitting: Neurology

## 2014-06-03 NOTE — Telephone Encounter (Signed)
Patient's Mom is calling to give an update on the patient. Please call.

## 2014-06-03 NOTE — Telephone Encounter (Signed)
Spoke with June who sts. Christella Scheuermann has faxed request for md notes twice to our office--she needs these notes faxed to [facility] asap so they can make a determination on coverage.  Notes faxed to [facility] fax# [phone], claim # [id number] referenced on fax/fim

## 2014-06-11 ENCOUNTER — Telehealth: Payer: Self-pay | Admitting: Neurology

## 2014-06-11 NOTE — Telephone Encounter (Signed)
Spoke with Janett Billow who sts. she has already spoken with [place] mother, and no longer has questions for me/fim

## 2014-06-11 NOTE — Telephone Encounter (Signed)
Gaetano Net, Director of Medical Records with [facility] @ [phone], requesting why patient needed 2D Echocardiogram on 3/15 to complete billing.  Please call and advise.

## 2014-06-16 ENCOUNTER — Telehealth: Payer: Self-pay | Admitting: Neurology

## 2014-06-16 ENCOUNTER — Other Ambulatory Visit: Payer: Self-pay | Admitting: *Deleted

## 2014-06-16 DIAGNOSIS — G35 Multiple sclerosis: Secondary | ICD-10-CM

## 2014-06-16 MED ORDER — BACLOFEN 10 MG PO TABS: 10.0000 mg | ORAL_TABLET | Freq: Three times a day (TID) | ORAL | Status: DC

## 2014-06-16 NOTE — Telephone Encounter (Signed)
Patient is returning a call. °

## 2014-06-16 NOTE — Telephone Encounter (Signed)
Patient stated she's experiencing cramping and drawing up of Left hand and questioning what could be done.  Please call and advise.

## 2014-06-16 NOTE — Telephone Encounter (Signed)
[place].  Will see if she is still taking Clonazepam and/or Robaxin/fim

## 2014-06-16 NOTE — Telephone Encounter (Signed)
Spoke with Ann Oconnor who sts. Ann Oconnor is having 2-3 episodes daily of increased spasms in left hand only.  She is taking Robaxin as rx'd, also taking Clonazepam once daily.  Per RAS, advised Ann Oconnor that Ann Oconnor should d/c Robaxin, and begin Baclofen 10mg  po tid.  Ann Oconnor is agreeable with this plan.  Rx. escribed to [facility] on [address] per her request/fim

## 2014-06-16 NOTE — Telephone Encounter (Signed)
Janelle Floor Physical Therapist with [facility] requesting a return call to patients home.

## 2014-06-24 ENCOUNTER — Telehealth: Payer: Self-pay | Admitting: *Deleted

## 2014-06-24 DIAGNOSIS — Z0289 Encounter for other administrative examinations: Secondary | ICD-10-CM

## 2014-06-24 NOTE — Telephone Encounter (Signed)
Patient form on [place] desk. 

## 2014-07-06 ENCOUNTER — Encounter: Payer: Self-pay | Admitting: Neurology

## 2014-07-06 ENCOUNTER — Ambulatory Visit (INDEPENDENT_AMBULATORY_CARE_PROVIDER_SITE_OTHER): Payer: BLUE CROSS/BLUE SHIELD | Admitting: Neurology

## 2014-07-06 VITALS — BP 98/70 | HR 74 | Resp 16 | Ht 64.0 in | Wt 236.0 lb

## 2014-07-06 DIAGNOSIS — Z5111 Encounter for antineoplastic chemotherapy: Secondary | ICD-10-CM

## 2014-07-06 DIAGNOSIS — Z79899 Other long term (current) drug therapy: Secondary | ICD-10-CM | POA: Diagnosis not present

## 2014-07-06 DIAGNOSIS — G35 Multiple sclerosis: Secondary | ICD-10-CM

## 2014-07-06 DIAGNOSIS — N3941 Urge incontinence: Secondary | ICD-10-CM | POA: Diagnosis not present

## 2014-07-06 DIAGNOSIS — R29898 Other symptoms and signs involving the musculoskeletal system: Secondary | ICD-10-CM | POA: Diagnosis not present

## 2014-07-06 DIAGNOSIS — R200 Anesthesia of skin: Secondary | ICD-10-CM

## 2014-07-06 DIAGNOSIS — R208 Other disturbances of skin sensation: Secondary | ICD-10-CM

## 2014-07-06 DIAGNOSIS — Z09 Encounter for follow-up examination after completed treatment for conditions other than malignant neoplasm: Secondary | ICD-10-CM

## 2014-07-06 NOTE — Progress Notes (Signed)
[facility]  PATIENT: Ann Oconnor DOB: 07/25/1975  REFERRING DOCTOR OR PCP:  Delia Chimes SOURCE: Patient, records, images  _________________________________   HISTORICAL  CHIEF COMPLAINT:  Chief Complaint  Patient presents with  . Multiple Sclerosis    1st dose of Novantrone was 05-21-14 at [facility].  She reports some nausea associated with infusion but otherwise she tolerated it well.  Since then, she reports feeling stronger.  She has a physical therapist coming to her home.  She still needs help getting into a sitting position, but once there she can sit unassisted for a few minutes.  She is not able to wt. bear at all yet./fim    HISTORY OF PRESENT ILLNESS:  Ann Oconnor is a 39 yo woman with a very aggressive MS.    She underwent Novantrone treatment about 6 weeks ago and since then she and her family have noticed that she has improved strength, improved cognition and better function. She still cannot transfer without assistance but is able to help just a little bit now.  MS:    She presented in 2002 with numbness in her legs and poor gait.  The MRI scan showed MS plaques in the spinal cord and the brain and she was diagnosed with multiple sclerosis. She was initially placed on Rebif. At first she did well with only a couple of small central exacerbations. However, 2003 she had a more severe exacerbation that affected her gait. She was placed on a combination Rebif and Copaxone for a short period of time and then was on Copaxone monotherapy. She switched to [place] in 2011 due to needle fatigue. She had numbness in her feet that would not go away and was switched to Tecfidera from [place] in November 2015.  In January 2016, she lost strength in both legs. Additionally, she had the onset of urinary incontinence. She had a course of IV steroids without any benefit in late January. Because there was no benefit, she was admitted to [facility] for a course of  plasmapheresis starting February 5. She got no benefit from the plasmapheresis. During the next week, she was home but she was doing worse and worse. She then presented to [facility]. She was admitted for another week of steroids. An MRI done at admission showed very aggressive MS changes. Repeat MRI 2 months ago showed significant progression compared to just 6 weeks earlier.     MRI's:     Brain 04/28/2014 shows multiple infratentorial plaques including left greater than right middle cerebellar peduncles, bilateral pons, and cerebellum. Additionally there are multiple foci in the hemispheres, some with a concentric demyelinating pattern that could be consistent with Balo's concentric sclerosis, an especially fulminant form of multiple sclerosis. She was unable to complete the examination or get contrast due to the need to terminate the study early.   When compared to an MRI dated 02/07/2012, there has been dramatic change with multiple new brain stem and hemispheric foci.   I also reviewed actual images from MRI of the cervical spine 10/26/2010.  It shows multiple plaques:  right at the C2 level, left aspect C2-3 level, right aspect C3 level, the posterior central aspect C3 level, upper C6 level slightly greater to the right and C7 level greater to the left.  Abnormal signal within the left aspect of the cord at the T3- T4 level.   MRi of the brain in late March showed several additional posterior fossa lesions.  Gait/strength/sensation:    She is unable to walk. However, her leg function has greatly improved improved and she is now 4+ over 5 in the left leg compared to 2+  2 months ago; the right leg is 4 over 5 compared to 2 minus.   The painful numbness is better now is almost exclusively in the left hand and lower arm only.   She is on hydrocodone 10 mg and gabapentin 400 mg twice a day with benefit   Bladder/bowel:   She reports both bowel and bladder incontinence, but has done better the  past month.   Vision is fine  Fatigue/sleep:  She feels much more fatigued in general.  This is both physical and cognitive. This worsens a little bit as the day goes on.   She is able to sleep fairly well at night.  Mood/cognition:   She notes improved depression the past month.and is no longer tearful. There is also some anxiety. She is on Paxil and has been on that for many years. She is also on clonazepam. Her cognitive issues are better though she is not at baseline  REVIEW OF SYSTEMS: Constitutional: No fevers, chills, sweats, or change in appetite.  Fatigue Eyes: No visual changes, double vision, eye pain Ear, nose and throat: No hearing loss, ear pain, nasal congestion, sore throat Cardiovascular: No chest pain, palpitations Respiratory: No shortness of breath at rest or with exertion.   No wheezes GastrointestinaI: No nausea, vomiting.  Some fecal incontinence Genitourinary: as above. Musculoskeletal: No neck pain, back pain Integumentary: No rash, pruritus, skin lesions Neurological: as above Psychiatric: Notes depression at this time.  Some anxiety Endocrine: No palpitations, diaphoresis, change in appetite, change in weigh or increased thirst Hematologic/Lymphatic: No anemia, purpura, petechiae. Allergic/Immunologic: No itchy/runny eyes, nasal congestion, recent allergic reactions, rashes  ALLERGIES: No Known Allergies  HOME MEDICATIONS:  Current outpatient prescriptions:  .  baclofen (LIORESAL) 10 MG tablet, Take 1 tablet (10 mg total) by mouth 3 (three) times daily., Disp: 90 each, Rfl: 3 .  clonazePAM (KLONOPIN) 0.5 MG tablet, Take one tablet by mouth twice daily for anxiety. Hold for sedation, Disp: 60 tablet, Rfl: 5 .  gabapentin (NEURONTIN) 400 MG capsule, Take 1 capsule (400 mg total) by mouth 2 (two) times daily., Disp: 60 capsule, Rfl: 3 .  Mitoxantrone HCl (NOVANTRONE IV), Inject 27 mg into the vein every 3 (three) months. At [facility] Stay at [facility], Disp:  , Rfl:  .  PARoxetine (PAXIL-CR) 37.5 MG 24 hr tablet, TAKE 1 TABLET BY MOUTH DAILY, Disp: 15 tablet, Rfl: 0 .  polyethylene glycol (MIRALAX / GLYCOLAX) packet, Take 17 g by mouth daily as needed for mild constipation., Disp: 14 each, Rfl: 0 .  Vitamin D, Ergocalciferol, (DRISDOL) 50000 UNITS CAPS capsule, 2 (two) times a week., Disp: , Rfl: 3 .  HYDROcodone-acetaminophen (NORCO) 10-325 MG per tablet, Take one tablet by mouth three times daily for right hip pain. Do not exceed 4gm of Tylenol in 24 hours (Patient not taking: Reported on 07/06/2014), Disp: 90 tablet, Rfl: 0 .  HYDROcodone-acetaminophen (NORCO/VICODIN) 5-325 MG per tablet, Take two tablets by mouth every 4 hours as needed for breakthrough pain. Do not exceed 4gm of Tylenol in 24 hours (Patient not taking: Reported on 07/06/2014), Disp: 240 tablet, Rfl: 0 .  sennosides-docusate sodium (SENOKOT-S) 8.6-50 MG tablet, Take 2 tablets by mouth daily., Disp: , Rfl:  .  valsartan (DIOVAN) 80 MG tablet, Take 80 mg by mouth daily., Disp: ,  Rfl:   PAST MEDICAL HISTORY: Past Medical History  Diagnosis Date  . MS (multiple sclerosis) 2002  . LGSIL (low grade squamous intraepithelial dysplasia)   . Movement disorder   . Vision abnormalities     PAST SURGICAL HISTORY: Past Surgical History  Procedure Laterality Date  . Cervical cone biopsy  2008    CIN 2    FAMILY HISTORY: Family History  Problem Relation Age of Onset  . Thyroid disease Mother   . Thyroid disease Sister   . Cancer Paternal Aunt     not sure what kind, maybe cervix  . Thyroid disease Sister   . Heart disease Father   . Mitral valve prolapse Father     SOCIAL HISTORY:  History   Social History  . Marital Status: Single    Spouse Name: N/A  . Number of Children: N/A  . Years of Education: N/A   Occupational History  . Not on file.   Social History Main Topics  . Smoking status: Never Smoker   . Smokeless tobacco: Never Used  . Alcohol Use: No  . Drug Use:  No  . Sexual Activity: No   Other Topics Concern  . Not on file   Social History Narrative     PHYSICAL EXAM  Filed Vitals:   07/06/14 0939  BP: 98/70  Pulse: 74  Resp: 16  Height: 5\' 4"  (1.626 m)  Weight: 236 lb (107.049 kg)    Body mass index is 40.49 kg/(m^2).   General: The patient is well-developed and well-nourished and in no acute distress  Neck: The neck is supple, no carotid bruits are noted.    Cardiovascular: The heart has a regular rate and rhythm with a normal S1 and S2. There were no murmurs, gallops or rubs.   Skin: Extremities are without significant edema.  Musculoskeletal:  Back is nontender  Neurologic Exam  Mental status: The patient is alert and oriented x 3 at the time of the examination. The patient has apparent normal recent and remote memory, but reduced attention span and concentration ability.   Speech is normal.   She was tearful twice.   Cranial nerves: Extraocular movements are full. Pupils are equal, round, and reactive to light and accomodation.  Visual fields are full.  Facial symmetry is present. There is good facial sensation to soft touch bilaterally.Facial strength is normal.  Trapezius and sternocleidomastoid strength is normal. No dysarthria is noted.  The tongue is midline, and the patient has symmetric elevation of the soft palate. No obvious hearing deficits are noted.  Motor:  Muscle bulk is normal.   Tone is increased in right arm > R leg > left leg. Strength is  4++/5 in right arm, 5/5, left arm.   4- right leg and 4/5 left leg  Sensory: Sensory testing is intact to pinprick, soft touch and vibration sensation in all 4 extremities.  Coordination: Cerebellar testing reveals reduced finger-nose-finger and RAM, worse on right.  Poor heel-to-shin bilaterally.  Gait and station: She is unable to stand.   Trunk is now stable.   Reflexes: Deep tendon reflexes are increased bilaterally, slightly more on the right.  Plantar responses  are extensor on the right.    DIAGNOSTIC DATA (LABS, IMAGING, TESTING) - I reviewed patient records, labs, notes, testing and imaging myself where available.  Lab Results  Component Value Date   WBC 5.0 05/21/2014   HGB 11.5* 05/21/2014   HCT 34.9* 05/21/2014   MCV 94.3 05/21/2014  PLT 305 05/21/2014      Component Value Date/Time   NA 141 05/17/2014   NA 137 04/29/2014 0421   K 4.2 05/17/2014   CL 108 04/29/2014 0421   CO2 19 04/29/2014 0421   GLUCOSE 132* 04/29/2014 0421   BUN 7 05/17/2014   BUN 7 04/29/2014 0421   CREATININE 0.5 05/17/2014   CREATININE 0.46* 04/29/2014 0421   CREATININE 0.75 09/02/2012 1057   CALCIUM 9.3 04/29/2014 0421   PROT 7.8 04/28/2014 0504   ALBUMIN 5.6* 04/28/2014 0504   AST 46* 04/28/2014 0504   ALT 25 04/28/2014 0504   ALKPHOS 43 04/28/2014 0504   BILITOT 1.1 04/28/2014 0504   GFRNONAA >90 04/29/2014 0421   GFRAA >90 04/29/2014 0421   Lab Results  Component Value Date   CHOL 217* 02/09/2013   HDL 45 02/09/2013   LDLCALC 146* 02/09/2013   TRIG 130 02/09/2013   CHOLHDL 4.8 02/09/2013   No results found for: HGBA1C Lab Results  Component Value Date   VITAMINB12 302 09/14/2008   Lab Results  Component Value Date   TSH 2.871 08/21/2011       ASSESSMENT AND PLAN  Chemotherapy follow-up examination - Plan: Echocardiogram, CBC with Differential/Platelet, Hepatic Function Panel  Multiple sclerosis  Urge incontinence of urine  Numbness of both lower extremities  High risk medication use  Weakness of both lower extremities  Encounter for chemotherapy management   She is generally doing much better since her Novantrone 6 weeks ago. Specifically she has much better like strength especially on the left and her cognition has improved, though not to baseline.  1.   Check labs today 2.   ECHO 3.   Set up port a cath as she has access issues with Novantrone last time 4.  Remain active, continue PT 5.  We discussed that we  will likely do 4-6 treatments of Novantrone over the next year to year and a half. Then due to CHF and leukemia risk we will likely switch to another agent.  Vianey Caniglia A. Felecia Shelling, MD, PhD 06/09/5679, 27:51 AM Certified in Neurology, Clinical Neurophysiology, Sleep Medicine, Pain Medicine and Neuroimaging  [facility] [address], [place], [place] [address] [phone]  BSA = 2.28  Dose 12 mg/M2 = 27.3

## 2014-07-07 ENCOUNTER — Telehealth: Payer: Self-pay | Admitting: Neurology

## 2014-07-07 ENCOUNTER — Telehealth: Payer: Self-pay | Admitting: *Deleted

## 2014-07-07 DIAGNOSIS — R29898 Other symptoms and signs involving the musculoskeletal system: Secondary | ICD-10-CM

## 2014-07-07 DIAGNOSIS — G35 Multiple sclerosis: Secondary | ICD-10-CM

## 2014-07-07 LAB — HEPATIC FUNCTION PANEL
ALBUMIN: 4.2 g/dL (ref 3.5–5.5)
ALT: 13 IU/L (ref 0–32)
AST: 15 IU/L (ref 0–40)
Alkaline Phosphatase: 73 IU/L (ref 39–117)
BILIRUBIN TOTAL: 0.2 mg/dL (ref 0.0–1.2)
Bilirubin, Direct: 0.05 mg/dL (ref 0.00–0.40)
Total Protein: 6.5 g/dL (ref 6.0–8.5)

## 2014-07-07 LAB — CBC WITH DIFFERENTIAL/PLATELET
BASOS ABS: 0 10*3/uL (ref 0.0–0.2)
Basos: 1 %
EOS (ABSOLUTE): 0.1 10*3/uL (ref 0.0–0.4)
Eos: 2 %
Hematocrit: 38.2 % (ref 34.0–46.6)
Hemoglobin: 12.6 g/dL (ref 11.1–15.9)
IMMATURE GRANULOCYTES: 0 %
Immature Grans (Abs): 0 10*3/uL (ref 0.0–0.1)
Lymphocytes Absolute: 1 10*3/uL (ref 0.7–3.1)
Lymphs: 22 %
MCH: 31.3 pg (ref 26.6–33.0)
MCHC: 33 g/dL (ref 31.5–35.7)
MCV: 95 fL (ref 79–97)
MONOCYTES: 8 %
Monocytes Absolute: 0.4 10*3/uL (ref 0.1–0.9)
NEUTROS PCT: 67 %
Neutrophils Absolute: 3.1 10*3/uL (ref 1.4–7.0)
PLATELETS: 375 10*3/uL (ref 150–379)
RBC: 4.03 x10E6/uL (ref 3.77–5.28)
RDW: 15.2 % (ref 12.3–15.4)
WBC: 4.6 10*3/uL (ref 3.4–10.8)

## 2014-07-07 NOTE — Telephone Encounter (Signed)
Patient is calling to get an order sent to [facility] for patient to continue physical therapy. Patient is getting ready to go on [facility]. Thank you.

## 2014-07-07 NOTE — Telephone Encounter (Signed)
-----   Message from Britt Bottom, MD sent at 07/06/2014  4:44 PM EDT ----- Please set up  1.  port -a cath 2.   Novantrone at same dose (we could do that here or at [facility]

## 2014-07-07 NOTE — Telephone Encounter (Signed)
Spoke with June.  Ann Oconnor has lost her job, and is losing her insurance--will now have ins. thru [facility]--needs new referral to [facility] for pt.  I have ordered referral/fim

## 2014-07-07 NOTE — Telephone Encounter (Signed)
I have spoken with June this am and advised that 1--I have put in referral for a surgical consult for placement of pac, and 2--Ann Oconnor's next Novantrone infusion is sched. for 08-20-14 at [facility] short stay.  June verbalized understanding of same/fim

## 2014-07-20 ENCOUNTER — Other Ambulatory Visit (HOSPITAL_COMMUNITY): Payer: BLUE CROSS/BLUE SHIELD

## 2014-07-20 NOTE — Telephone Encounter (Signed)
Patient called stating that [facility] has not received the referral. Please refax the referral to # [phone]. Patient can be reached @ [phone]. Thanks!

## 2014-07-23 DIAGNOSIS — Z0289 Encounter for other administrative examinations: Secondary | ICD-10-CM

## 2014-08-03 ENCOUNTER — Telehealth: Payer: Self-pay | Admitting: Neurology

## 2014-08-03 MED ORDER — GABAPENTIN 400 MG PO CAPS: 400.0000 mg | ORAL_CAPSULE | Freq: Two times a day (BID) | ORAL | Status: DC

## 2014-08-03 NOTE — Telephone Encounter (Signed)
Patient called requesting a refill for gabapentin (NEURONTIN) 400 MG capsule. Pharmacy is [facility] on [address]. Patient can be reached @ [phone]

## 2014-08-03 NOTE — Telephone Encounter (Signed)
Rx. escribed to [facility] per request/fim

## 2014-08-05 ENCOUNTER — Telehealth: Payer: Self-pay

## 2014-08-05 NOTE — Telephone Encounter (Signed)
PATIENT RETURNED [place]

## 2014-08-05 NOTE — Telephone Encounter (Signed)
LEFT MESSAGE  ON PATIENT VOICEMAIL  TO FIND OUT WHAT TYPE OF INSURANCE PATIENT HAS

## 2014-08-06 ENCOUNTER — Telehealth: Payer: Self-pay

## 2014-08-06 NOTE — Telephone Encounter (Signed)
Left voicemail message to see if patient has been precert for up coming echo June 7th 2016

## 2014-08-06 NOTE — Telephone Encounter (Signed)
JANESHA  RETURNED CALL .Marland KitchenSHE WILL TRACK DOWN AUTH NUMBER AND ADD IT TO  APPT COMMENTS

## 2014-08-10 ENCOUNTER — Telehealth: Payer: Self-pay | Admitting: Neurology

## 2014-08-10 ENCOUNTER — Ambulatory Visit (HOSPITAL_COMMUNITY): Payer: BLUE CROSS/BLUE SHIELD

## 2014-08-10 NOTE — Telephone Encounter (Signed)
[phone]  Ann Oconnor with [facility] has the patient there today for imaging. However, in the notes it said she was to have this done at Dr. Irven Shelling office. Please call Ann Oconnor back to confirm [phone]

## 2014-08-10 NOTE — Telephone Encounter (Signed)
I have spoken with Dr. Irven Shelling office and with Ann Oconnor mother Ann Oconnor--since ins. coverage can't be verified at this time, and Ann Oconnor can't have her next chemo tx. for MS until she has her echo, they are going to keep her appt. for echo and pay out of pocket for it--the cost of the echo is $225--she has a credit of $50,so she will just owe $175./fim

## 2014-08-10 NOTE — Telephone Encounter (Signed)
Patients mother called and stated that she spoke with Faith RN earlier in the day and that faith told her to call back with the insurance information regarding the patient. Please call and advise.

## 2014-08-10 NOTE — Telephone Encounter (Signed)
See separate task from 08-10-14.  Echo should have been scheduled at Dr. Irven Shelling office.  Ann Oconnor's first echo was done at Dr. Irven Shelling office.  If echo today is done at [facility], order would need to be changed from limited to complete.  [facility] can not see the echo done at Dr. Irven Shelling office to compare.  Echo was r/s to Dr. Irven Shelling office for tomorrow at 1145/fim

## 2014-08-13 ENCOUNTER — Telehealth: Payer: Self-pay | Admitting: Neurology

## 2014-08-13 NOTE — Telephone Encounter (Signed)
I have spoken with Ann Oconnor this afternoon--I had spoken with her mother, Ann Oconnor this morning, and gave appt. with Dr. Craige Cotta at [facility], on 08-16-14 at 11am, arrive 15 min. early, for eval of pac. Address for [facility] is [address], and their phone # is [phone].   Ann Oconnor sts. they misplaced appt. info, but have since found it, so she does not need anything further from me./fim

## 2014-08-13 NOTE — Telephone Encounter (Signed)
Patient requested to speak with Faith RN regarding her appt that Faith set up for her on 6/13. She doesn't know who the appt was with or where she is supposed to go. Please call and advise.

## 2014-08-16 ENCOUNTER — Ambulatory Visit: Payer: Self-pay | Admitting: Surgery

## 2014-08-16 NOTE — H&P (Signed)
History of Present Illness Ann Oconnor. Ann Pereida MD; 08/16/2014 11:48 AM) Patient words: discuss PAC placement.  The patient is a 39 year old female who presents with a complaint of difficult iv access. Referred by Ann Chimes, NP for evaluation for [facility] placement. This is a 39 yo female with severe multiple sclerosis who is being treated with Novantrone infusions. She has difficulties with IV access and we are being asked to place a port. She had some sort of central access placed previously at [facility], but it was removed when those treatments were completed.  Other Problems Ann Oconnor, CMA; 08/16/2014 11:24 AM) No pertinent past medical history  Past Surgical History Ann Oconnor, [place]; 08/16/2014 11:24 AM) Oral Surgery  Diagnostic Studies History Ann Oconnor, [place]; 08/16/2014 11:24 AM) Colonoscopy 5-10 years ago Mammogram never Pap Smear 1-5 years ago  Allergies Ann Oconnor, CMA; 08/16/2014 11:24 AM) No Known Drug Allergies 08/16/2014  Medication History Ann Oconnor, CMA; 08/16/2014 11:25 AM) Baclofen (10MG  Tablet, Oral) Active. Gabapentin (400MG  Capsule, Oral) Active. ClonazePAM (0.5MG  Tablet, Oral) Active. PARoxetine HCl ER (37.5MG  Tablet ER 24HR, Oral) Active. Medications Reconciled  Social History Ann Oconnor, [place]; 08/16/2014 11:24 AM) Alcohol use Occasional alcohol use. Caffeine use Carbonated beverages. No drug use Tobacco use Never smoker.  Family History Ann Oconnor, [place]; 08/16/2014 11:24 AM) Arthritis Father. Heart disease in female family member before age 46 Thyroid problems Mother, Sister.  Pregnancy / Birth History Ann Oconnor, CMA; 08/16/2014 11:24 AM) Age at menarche 58 years. Gravida 0 Irregular periods Para 0     Review of Systems Ann Oconnor CMA; 08/16/2014 11:24 AM) General Not Present- Appetite Loss, Chills, Fatigue, Fever, Night Sweats, Weight Gain and Weight Loss. Skin Not Present- Change in Wart/Mole, Dryness, Hives, Jaundice,  New Lesions, Non-Healing Wounds, Rash and Ulcer. HEENT Present- Wears glasses/contact lenses. Not Present- Earache, Hearing Loss, Hoarseness, Nose Bleed, Oral Ulcers, Ringing in the Ears, Seasonal Allergies, Sinus Pain, Sore Throat, Visual Disturbances and Yellow Eyes. Respiratory Not Present- Bloody sputum, Chronic Cough, Difficulty Breathing, Snoring and Wheezing. Breast Not Present- Breast Mass, Breast Pain, Nipple Discharge and Skin Changes. Cardiovascular Not Present- Chest Pain, Difficulty Breathing Lying Down, Leg Cramps, Palpitations, Rapid Heart Rate, Shortness of Breath and Swelling of Extremities. Gastrointestinal Not Present- Abdominal Pain, Bloating, Bloody Stool, Change in Bowel Habits, Chronic diarrhea, Constipation, Difficulty Swallowing, Excessive gas, Gets full quickly at meals, Hemorrhoids, Indigestion, Nausea, Rectal Pain and Vomiting. Female Genitourinary Not Present- Frequency, Nocturia, Painful Urination, Pelvic Pain and Urgency. Musculoskeletal Not Present- Back Pain, Joint Pain, Joint Stiffness, Muscle Pain, Muscle Weakness and Swelling of Extremities. Neurological Present- Trouble walking and Weakness. Not Present- Decreased Memory, Fainting, Headaches, Numbness, Seizures, Tingling and Tremor. Psychiatric Not Present- Anxiety, Bipolar, Change in Sleep Pattern, Depression, Fearful and Frequent crying. Endocrine Not Present- Cold Intolerance, Excessive Hunger, Hair Changes, Heat Intolerance, Hot flashes and New Diabetes. Hematology Not Present- Easy Bruising, Excessive bleeding, Gland problems, HIV and Persistent Infections.  Vitals Ann Oconnor CMA; 08/16/2014 11:25 AM) 08/16/2014 11:25 AM Weight: 207 lb Height: 64in Body Surface Area: 2.06 m Body Mass Index: 35.53 kg/m Pulse: 93 (Regular)  BP: 128/72 (Sitting, Left Arm, Standard)     Physical Exam Ann Key K. Ily Denno MD; 08/16/2014 11:49 AM)  The physical exam findings are as follows: Note:WDWN in  NAD HEENT: EOMI, sclera anicteric Neck: No masses, no thyromegaly Lungs: CTA bilaterally; normal respiratory effort; healed right subclavian scar CV: Regular rate and rhythm; no murmurs Skin: Warm, dry; no sign of jaundice  Assessment & Plan Ann Oconnor. Ann Gerlich MD; 08/16/2014 11:42 AM)  MULTIPLE SCLEROSIS (340  G35)  DIFFICULT INTRAVENOUS ACCESS (V49.89  Z78.9)  Current Plans Schedule for Surgery - Port placement. The surgical procedure has been discussed with the patient. Potential risks, benefits, alternative treatments, and expected outcomes have been explained. All of the patient's questions at this time have been answered. The likelihood of reaching the patient's treatment goal is good. The patient understand the proposed surgical procedure and wishes to proceed.   Ann Oconnor. Ann Dover, MD, [facility]  General/ Trauma Surgery  08/16/2014 11:49 AM

## 2014-08-17 ENCOUNTER — Encounter (HOSPITAL_COMMUNITY): Payer: Self-pay | Admitting: *Deleted

## 2014-08-17 NOTE — Progress Notes (Signed)
I spoke with patient and instructed her of new arrival time of 9:00am.

## 2014-08-18 ENCOUNTER — Encounter (HOSPITAL_COMMUNITY)
Admission: RE | Disposition: A | Discharge status: Home / Self Care | Payer: Self-pay | Source: Ambulatory Visit | Attending: Surgery

## 2014-08-18 ENCOUNTER — Ambulatory Visit (HOSPITAL_COMMUNITY): Payer: BLUE CROSS/BLUE SHIELD | Admitting: Anesthesiology

## 2014-08-18 ENCOUNTER — Ambulatory Visit (HOSPITAL_COMMUNITY): Payer: BLUE CROSS/BLUE SHIELD

## 2014-08-18 ENCOUNTER — Ambulatory Visit (HOSPITAL_COMMUNITY)
Admission: RE | Admit: 2014-08-18 | Discharge: 2014-08-18 | Disposition: A | Discharge status: Home / Self Care | Payer: BLUE CROSS/BLUE SHIELD | Source: Ambulatory Visit | Attending: Surgery | Admitting: Surgery

## 2014-08-18 ENCOUNTER — Encounter (HOSPITAL_COMMUNITY): Payer: Self-pay | Admitting: *Deleted

## 2014-08-18 DIAGNOSIS — Z993 Dependence on wheelchair: Secondary | ICD-10-CM | POA: Insufficient documentation

## 2014-08-18 DIAGNOSIS — Z452 Encounter for adjustment and management of vascular access device: Secondary | ICD-10-CM

## 2014-08-18 DIAGNOSIS — G35 Multiple sclerosis: Secondary | ICD-10-CM | POA: Diagnosis not present

## 2014-08-18 DIAGNOSIS — Z95828 Presence of other vascular implants and grafts: Secondary | ICD-10-CM

## 2014-08-18 HISTORY — DX: Unspecified convulsions: R56.9

## 2014-08-18 HISTORY — PX: PORTACATH PLACEMENT: SHX2246

## 2014-08-18 HISTORY — DX: Unsteadiness on feet: R26.81

## 2014-08-18 HISTORY — DX: Anxiety disorder, unspecified: F41.9

## 2014-08-18 HISTORY — DX: Urinary tract infection, site not specified: N39.0

## 2014-08-18 HISTORY — DX: Unspecified urinary incontinence: R32

## 2014-08-18 HISTORY — DX: Family history of other specified conditions: Z84.89

## 2014-08-18 HISTORY — DX: Unspecified intracranial injury with loss of consciousness of unspecified duration, initial encounter: S06.9X9A

## 2014-08-18 LAB — BASIC METABOLIC PANEL
ANION GAP: 8 (ref 5–15)
BUN: 7 mg/dL (ref 6–20)
CALCIUM: 9.5 mg/dL (ref 8.9–10.3)
CO2: 28 mmol/L (ref 22–32)
CREATININE: 0.74 mg/dL (ref 0.44–1.00)
Chloride: 105 mmol/L (ref 101–111)
GFR calc non Af Amer: >60 mL/min (ref >60)
Glucose, Bld: 98 mg/dL (ref 65–99)
Potassium: 3.9 mmol/L (ref 3.5–5.1)
Sodium: 141 mmol/L (ref 135–145)

## 2014-08-18 LAB — CBC
HCT: 37.8 % (ref 36.0–46.0)
HEMOGLOBIN: 12.5 g/dL (ref 12.0–15.0)
MCH: 30.7 pg (ref 26.0–34.0)
MCHC: 33.1 g/dL (ref 30.0–36.0)
MCV: 92.9 fL (ref 78.0–100.0)
PLATELETS: 318 10*3/uL (ref 150–400)
RBC: 4.07 MIL/uL (ref 3.87–5.11)
RDW: 13.4 % (ref 11.5–15.5)
WBC: 4.5 10*3/uL (ref 4.0–10.5)

## 2014-08-18 LAB — HCG, SERUM, QUALITATIVE: PREG SERUM: NEGATIVE

## 2014-08-18 SURGERY — INSERTION, TUNNELED CENTRAL VENOUS DEVICE, WITH PORT
Anesthesia: General | Site: Chest | Laterality: Left

## 2014-08-18 MED ORDER — OXYCODONE HCL 5 MG PO TABS: 5.0000 mg | ORAL_TABLET | Freq: Once | ORAL | Status: DC | PRN

## 2014-08-18 MED ORDER — HEPARIN SOD (PORK) LOCK FLUSH 100 UNIT/ML IV SOLN
INTRAVENOUS | Status: AC
  Filled 2014-08-18: qty 5

## 2014-08-18 MED ORDER — HEPARIN SOD (PORK) LOCK FLUSH 100 UNIT/ML IV SOLN
INTRAVENOUS | Status: DC | PRN
  Administered 2014-08-18: 500 [IU] via INTRAVENOUS

## 2014-08-18 MED ORDER — ONDANSETRON HCL 4 MG/2ML IJ SOLN
INTRAMUSCULAR | Status: DC | PRN
  Administered 2014-08-18: 4 mg via INTRAVENOUS

## 2014-08-18 MED ORDER — FENTANYL CITRATE (PF) 250 MCG/5ML IJ SOLN
INTRAMUSCULAR | Status: AC
  Filled 2014-08-18: qty 5

## 2014-08-18 MED ORDER — CHLORHEXIDINE GLUCONATE 4 % EX LIQD: 1.0000 "application " | Freq: Once | CUTANEOUS | Status: DC

## 2014-08-18 MED ORDER — OXYCODONE HCL 5 MG/5ML PO SOLN: 5.0000 mg | Freq: Once | ORAL | Status: DC | PRN

## 2014-08-18 MED ORDER — CEFAZOLIN SODIUM-DEXTROSE 2-3 GM-% IV SOLR
2.0000 g | INTRAVENOUS | Status: AC
  Administered 2014-08-18: 2 g via INTRAVENOUS
  Filled 2014-08-18: qty 50

## 2014-08-18 MED ORDER — DEXAMETHASONE SODIUM PHOSPHATE 4 MG/ML IJ SOLN
INTRAMUSCULAR | Status: DC | PRN
  Administered 2014-08-18: 4 mg via INTRAVENOUS

## 2014-08-18 MED ORDER — ROCURONIUM BROMIDE 50 MG/5ML IV SOLN
INTRAVENOUS | Status: AC
  Filled 2014-08-18: qty 1

## 2014-08-18 MED ORDER — ONDANSETRON HCL 4 MG/2ML IJ SOLN: 4.0000 mg | INTRAMUSCULAR | Status: DC | PRN

## 2014-08-18 MED ORDER — HYDROCODONE-ACETAMINOPHEN 5-325 MG PO TABS: 1.0000 | ORAL_TABLET | ORAL | Status: DC | PRN

## 2014-08-18 MED ORDER — LACTATED RINGERS IV SOLN
INTRAVENOUS | Status: DC | PRN
  Administered 2014-08-18: 11:00:00 via INTRAVENOUS

## 2014-08-18 MED ORDER — BUPIVACAINE-EPINEPHRINE (PF) 0.25% -1:200000 IJ SOLN
INTRAMUSCULAR | Status: AC
  Filled 2014-08-18: qty 30

## 2014-08-18 MED ORDER — MORPHINE SULFATE 2 MG/ML IJ SOLN: 2.0000 mg | INTRAMUSCULAR | Status: DC | PRN

## 2014-08-18 MED ORDER — PROPOFOL 10 MG/ML IV BOLUS
INTRAVENOUS | Status: DC | PRN
  Administered 2014-08-18: 160 mg via INTRAVENOUS
  Administered 2014-08-18: 40 mg via INTRAVENOUS

## 2014-08-18 MED ORDER — SODIUM CHLORIDE 0.9 % IR SOLN
Status: DC | PRN
  Administered 2014-08-18: 12:00:00

## 2014-08-18 MED ORDER — MIDAZOLAM HCL 5 MG/5ML IJ SOLN
INTRAMUSCULAR | Status: DC | PRN
  Administered 2014-08-18 (×2): 1 mg via INTRAVENOUS

## 2014-08-18 MED ORDER — LACTATED RINGERS IV SOLN
INTRAVENOUS | Status: DC
Start: 2014-08-18 — End: 2014-08-18
  Administered 2014-08-18: 10:00:00 via INTRAVENOUS

## 2014-08-18 MED ORDER — MIDAZOLAM HCL 2 MG/2ML IJ SOLN
INTRAMUSCULAR | Status: AC
  Filled 2014-08-18: qty 2

## 2014-08-18 MED ORDER — LIDOCAINE HCL (CARDIAC) 20 MG/ML IV SOLN
INTRAVENOUS | Status: AC
  Filled 2014-08-18: qty 5

## 2014-08-18 MED ORDER — FENTANYL CITRATE (PF) 100 MCG/2ML IJ SOLN: 25.0000 ug | INTRAMUSCULAR | Status: DC | PRN

## 2014-08-18 MED ORDER — LIDOCAINE HCL (CARDIAC) 20 MG/ML IV SOLN
INTRAVENOUS | Status: DC | PRN
  Administered 2014-08-18: 70 mg via INTRAVENOUS

## 2014-08-18 MED ORDER — PROPOFOL 10 MG/ML IV BOLUS
INTRAVENOUS | Status: AC
  Filled 2014-08-18: qty 20

## 2014-08-18 MED ORDER — FENTANYL CITRATE (PF) 100 MCG/2ML IJ SOLN
INTRAMUSCULAR | Status: DC | PRN
  Administered 2014-08-18: 25 ug via INTRAVENOUS

## 2014-08-18 SURGICAL SUPPLY — 52 items
APL SKNCLS STERI-STRIP NONHPOA (GAUZE/BANDAGES/DRESSINGS) ×1
BAG DECANTER FOR FLEXI CONT (MISCELLANEOUS) ×3 IMPLANT
BENZOIN TINCTURE PRP APPL 2/3 (GAUZE/BANDAGES/DRESSINGS) ×3 IMPLANT
BLADE SURG 11 STRL SS (BLADE) ×3 IMPLANT
BLADE SURG 15 STRL LF DISP TIS (BLADE) ×1 IMPLANT
BLADE SURG 15 STRL SS (BLADE) ×3
CHLORAPREP W/TINT 10.5 ML (MISCELLANEOUS) ×3 IMPLANT
CLOSURE WOUND 1/2 X4 (GAUZE/BANDAGES/DRESSINGS) ×1
COVER SURGICAL LIGHT HANDLE (MISCELLANEOUS) ×3 IMPLANT
COVER TRANSDUCER ULTRASND GEL (DRAPE) IMPLANT
CRADLE DONUT ADULT HEAD (MISCELLANEOUS) ×3 IMPLANT
DRAPE C-ARM 42X72 X-RAY (DRAPES) ×3 IMPLANT
DRAPE LAPAROSCOPIC ABDOMINAL (DRAPES) ×3 IMPLANT
DRAPE UTILITY XL STRL (DRAPES) ×3 IMPLANT
DRSG TEGADERM 4X4.75 (GAUZE/BANDAGES/DRESSINGS) ×3 IMPLANT
ELECT CAUTERY BLADE 6.4 (BLADE) ×3 IMPLANT
ELECT REM PT RETURN 9FT ADLT (ELECTROSURGICAL) ×3
ELECTRODE REM PT RTRN 9FT ADLT (ELECTROSURGICAL) ×1 IMPLANT
GAUZE SPONGE 2X2 8PLY STRL LF (GAUZE/BANDAGES/DRESSINGS) ×1 IMPLANT
GAUZE SPONGE 4X4 16PLY XRAY LF (GAUZE/BANDAGES/DRESSINGS) ×3 IMPLANT
GEL ULTRASOUND 20GR AQUASONIC (MISCELLANEOUS) IMPLANT
GLOVE BIO SURGEON STRL SZ7 (GLOVE) ×3 IMPLANT
GLOVE BIOGEL PI IND STRL 7.0 (GLOVE) IMPLANT
GLOVE BIOGEL PI IND STRL 7.5 (GLOVE) ×1 IMPLANT
GLOVE BIOGEL PI INDICATOR 7.0 (GLOVE) ×2
GLOVE BIOGEL PI INDICATOR 7.5 (GLOVE) ×2
GOWN STRL REUS W/ TWL LRG LVL3 (GOWN DISPOSABLE) ×2 IMPLANT
GOWN STRL REUS W/TWL LRG LVL3 (GOWN DISPOSABLE) ×9
INTRODUCER COOK 11FR (CATHETERS) IMPLANT
KIT BASIN OR (CUSTOM PROCEDURE TRAY) ×3 IMPLANT
KIT PORT POWER 8FR ISP CVUE (Catheter) ×2 IMPLANT
KIT ROOM TURNOVER OR (KITS) ×3 IMPLANT
NDL HYPO 25GX1X1/2 BEV (NEEDLE) ×1 IMPLANT
NEEDLE HYPO 25GX1X1/2 BEV (NEEDLE) ×3 IMPLANT
NS IRRIG 1000ML POUR BTL (IV SOLUTION) ×3 IMPLANT
PACK SURGICAL SETUP 50X90 (CUSTOM PROCEDURE TRAY) ×3 IMPLANT
PAD ARMBOARD 7.5X6 YLW CONV (MISCELLANEOUS) ×3 IMPLANT
PENCIL BUTTON HOLSTER BLD 10FT (ELECTRODE) ×3 IMPLANT
SET INTRODUCER 12FR PACEMAKER (SHEATH) IMPLANT
SET SHEATH INTRODUCER 10FR (MISCELLANEOUS) IMPLANT
SHEATH COOK PEEL AWAY SET 9F (SHEATH) IMPLANT
SPONGE GAUZE 2X2 STER 10/PKG (GAUZE/BANDAGES/DRESSINGS)
STRIP CLOSURE SKIN 1/2X4 (GAUZE/BANDAGES/DRESSINGS) ×2 IMPLANT
SUT MNCRL AB 4-0 PS2 18 (SUTURE) ×3 IMPLANT
SUT PROLENE 2 0 SH DA (SUTURE) ×3 IMPLANT
SUT VIC AB 3-0 SH 27 (SUTURE) ×3
SUT VIC AB 3-0 SH 27X BRD (SUTURE) ×1 IMPLANT
SYR 20ML ECCENTRIC (SYRINGE) ×6 IMPLANT
SYR 5ML LUER SLIP (SYRINGE) ×3 IMPLANT
SYR CONTROL 10ML LL (SYRINGE) ×3 IMPLANT
TOWEL OR 17X24 6PK STRL BLUE (TOWEL DISPOSABLE) ×1 IMPLANT
TOWEL OR 17X26 10 PK STRL BLUE (TOWEL DISPOSABLE) ×3 IMPLANT

## 2014-08-18 NOTE — Anesthesia Preprocedure Evaluation (Addendum)
Anesthesia Evaluation  Patient identified by MRN, date of birth, ID band Patient awake    Reviewed: Allergy & Precautions, NPO status , Patient's Chart, lab work & pertinent test results  History of Anesthesia Complications Negative for: history of anesthetic complications  Airway Mallampati: II  TM Distance: >3 FB Neck ROM: Full    Dental  (+) Teeth Intact   Pulmonary  breath sounds clear to auscultation        Cardiovascular negative cardio ROS  Rhythm:Regular     Neuro/Psych Seizures -, Well Controlled,  Anxiety MS with numbness and weakness in right leg, wheelchair bound    GI/Hepatic negative GI ROS, Neg liver ROS,   Endo/Other  Morbid obesity  Renal/GU negative Renal ROS     Musculoskeletal negative musculoskeletal ROS (+)   Abdominal   Peds  Hematology negative hematology ROS (+)   Anesthesia Other Findings   Reproductive/Obstetrics                             Anesthesia Physical Anesthesia Plan  ASA: III  Anesthesia Plan: General   Post-op Pain Management:    Induction: Intravenous  Airway Management Planned: LMA  Additional Equipment: None  Intra-op Plan:   Post-operative Plan: Extubation in OR  Informed Consent: I have reviewed the patients History and Physical, chart, labs and discussed the procedure including the risks, benefits and alternatives for the proposed anesthesia with the patient or authorized representative who has indicated his/her understanding and acceptance.   Dental advisory given  Plan Discussed with: CRNA and Surgeon  Anesthesia Plan Comments:         Anesthesia Quick Evaluation

## 2014-08-18 NOTE — H&P (View-Only) (Signed)
History of Present Illness Ann Oconnor. Ann Buddenhagen MD; 08/16/2014 11:48 AM) Patient words: discuss PAC placement.  The patient is a 39 year old female who presents with a complaint of difficult iv access. Referred by Delia Chimes, NP for evaluation for [facility] placement. This is a 39 yo female with severe multiple sclerosis who is being treated with Novantrone infusions. She has difficulties with IV access and we are being asked to place a port. She had some sort of central access placed previously at [facility], but it was removed when those treatments were completed.  Other Problems Elbert Ewings, CMA; 08/16/2014 11:24 AM) No pertinent past medical history  Past Surgical History Elbert Ewings, [place]; 08/16/2014 11:24 AM) Oral Surgery  Diagnostic Studies History Elbert Ewings, [place]; 08/16/2014 11:24 AM) Colonoscopy 5-10 years ago Mammogram never Pap Smear 1-5 years ago  Allergies Elbert Ewings, CMA; 08/16/2014 11:24 AM) No Known Drug Allergies 08/16/2014  Medication History Elbert Ewings, CMA; 08/16/2014 11:25 AM) Baclofen (10MG  Tablet, Oral) Active. Gabapentin (400MG  Capsule, Oral) Active. ClonazePAM (0.5MG  Tablet, Oral) Active. PARoxetine HCl ER (37.5MG  Tablet ER 24HR, Oral) Active. Medications Reconciled  Social History Elbert Ewings, [place]; 08/16/2014 11:24 AM) Alcohol use Occasional alcohol use. Caffeine use Carbonated beverages. No drug use Tobacco use Never smoker.  Family History Elbert Ewings, [place]; 08/16/2014 11:24 AM) Arthritis Father. Heart disease in female family member before age 26 Thyroid problems Mother, Sister.  Pregnancy / Birth History Elbert Ewings, CMA; 08/16/2014 11:24 AM) Age at menarche 22 years. Gravida 0 Irregular periods Para 0     Review of Systems Elbert Ewings CMA; 08/16/2014 11:24 AM) General Not Present- Appetite Loss, Chills, Fatigue, Fever, Night Sweats, Weight Gain and Weight Loss. Skin Not Present- Change in Wart/Mole, Dryness, Hives, Jaundice,  New Lesions, Non-Healing Wounds, Rash and Ulcer. HEENT Present- Wears glasses/contact lenses. Not Present- Earache, Hearing Loss, Hoarseness, Nose Bleed, Oral Ulcers, Ringing in the Ears, Seasonal Allergies, Sinus Pain, Sore Throat, Visual Disturbances and Yellow Eyes. Respiratory Not Present- Bloody sputum, Chronic Cough, Difficulty Breathing, Snoring and Wheezing. Breast Not Present- Breast Mass, Breast Pain, Nipple Discharge and Skin Changes. Cardiovascular Not Present- Chest Pain, Difficulty Breathing Lying Down, Leg Cramps, Palpitations, Rapid Heart Rate, Shortness of Breath and Swelling of Extremities. Gastrointestinal Not Present- Abdominal Pain, Bloating, Bloody Stool, Change in Bowel Habits, Chronic diarrhea, Constipation, Difficulty Swallowing, Excessive gas, Gets full quickly at meals, Hemorrhoids, Indigestion, Nausea, Rectal Pain and Vomiting. Female Genitourinary Not Present- Frequency, Nocturia, Painful Urination, Pelvic Pain and Urgency. Musculoskeletal Not Present- Back Pain, Joint Pain, Joint Stiffness, Muscle Pain, Muscle Weakness and Swelling of Extremities. Neurological Present- Trouble walking and Weakness. Not Present- Decreased Memory, Fainting, Headaches, Numbness, Seizures, Tingling and Tremor. Psychiatric Not Present- Anxiety, Bipolar, Change in Sleep Pattern, Depression, Fearful and Frequent crying. Endocrine Not Present- Cold Intolerance, Excessive Hunger, Hair Changes, Heat Intolerance, Hot flashes and New Diabetes. Hematology Not Present- Easy Bruising, Excessive bleeding, Gland problems, HIV and Persistent Infections.  Vitals Elbert Ewings CMA; 08/16/2014 11:25 AM) 08/16/2014 11:25 AM Weight: 207 lb Height: 64in Body Surface Area: 2.06 m Body Mass Index: 35.53 kg/m Pulse: 93 (Regular)  BP: 128/72 (Sitting, Left Arm, Standard)     Physical Exam Rodman Key K. Leiyah Maultsby MD; 08/16/2014 11:49 AM)  The physical exam findings are as follows: Note:WDWN in  NAD HEENT: EOMI, sclera anicteric Neck: No masses, no thyromegaly Lungs: CTA bilaterally; normal respiratory effort; healed right subclavian scar CV: Regular rate and rhythm; no murmurs Skin: Warm, dry; no sign of jaundice  Assessment & Plan Ann Oconnor. Jerilynn Feldmeier MD; 08/16/2014 11:42 AM)  MULTIPLE SCLEROSIS (340  G35)  DIFFICULT INTRAVENOUS ACCESS (V49.89  Z78.9)  Current Plans Schedule for Surgery - Port placement. The surgical procedure has been discussed with the patient. Potential risks, benefits, alternative treatments, and expected outcomes have been explained. All of the patient's questions at this time have been answered. The likelihood of reaching the patient's treatment goal is good. The patient understand the proposed surgical procedure and wishes to proceed.   Ann Oconnor. [name], MD, [facility] Surgery  General/ Trauma Surgery  08/16/2014 11:49 AM

## 2014-08-18 NOTE — Progress Notes (Signed)
Report given to laura yontz rn as caregiver

## 2014-08-18 NOTE — Discharge Instructions (Signed)
    PORT-A-CATH: POST OP INSTRUCTIONS  Always review your discharge instruction sheet given to you by the facility where your surgery was performed.   1. A prescription for pain medication may be given to you upon discharge. Take your pain medication as prescribed, if needed. If narcotic pain medicine is not needed, then you make take acetaminophen (Tylenol) or ibuprofen (Advil) as needed.  2. Take your usually prescribed medications unless otherwise directed. 3. If you need a refill on your pain medication, please contact our office. All narcotic pain medicine now requires a paper prescription.  Phoned in and fax refills are no longer allowed by law.  Prescriptions will not be filled after 5 pm or on weekends.  4. You should follow a light diet for the remainder of the day after your procedure. 5. Most patients will experience some mild swelling and/or bruising in the area of the incision. It may take several days to resolve. 6. It is common to experience some constipation if taking pain medication after surgery. Increasing fluid intake and taking a stool softener (such as Colace) will usually help or prevent this problem from occurring. A mild laxative (Milk of Magnesia or Miralax) should be taken according to package directions if there are no bowel movements after 48 hours.  7. Unless discharge instructions indicate otherwise, you may remove your bandages 48 hours after surgery, and you may shower at that time. You may have steri-strips (small white skin tapes) in place directly over the incision.  These strips should be left on the skin for 7-10 days.  If your surgeon used Dermabond (skin glue) on the incision, you may shower in 24 hours.  The glue will flake off over the next 2-3 weeks.  8. If your port is left accessed at the end of surgery (needle left in port), the dressing cannot get wet and should only by changed by a healthcare professional. When the port is no longer accessed (when the  needle has been removed), follow step 7.   9. ACTIVITIES:  Limit activity involving your arms for the next 72 hours. Do no strenuous exercise or activity for 1 week. You may drive when you are no longer taking prescription pain medication, you can comfortably wear a seatbelt, and you can maneuver your car. 10.You may need to see your doctor in the office for a follow-up appointment.  Please       check with your doctor.  11.When you receive a new Port-a-Cath, you will get a product guide and        ID card.  Please keep them in case you need them.  WHEN TO CALL YOUR DOCTOR ([phone]): 1. Fever over 101.0 2. Chills 3. Continued bleeding from incision 4. Increased redness and tenderness at the site 5. Shortness of breath, difficulty breathing   The clinic staff is available to answer your questions during regular business hours. Please don't hesitate to call and ask to speak to one of the nurses or medical assistants for clinical concerns. If you have a medical emergency, go to the nearest emergency room or call 911.  A surgeon from [facility] is always on call at the hospital.     For further information, please visit [email or web address]      

## 2014-08-18 NOTE — Anesthesia Procedure Notes (Signed)
Procedure Name: LMA Insertion Date/Time: 08/18/2014 11:43 AM Performed by: Susa Loffler Pre-anesthesia Checklist: Patient identified, Timeout performed, Emergency Drugs available, Suction available and Patient being monitored Patient Re-evaluated:Patient Re-evaluated prior to inductionOxygen Delivery Method: Circle system utilized Preoxygenation: Pre-oxygenation with 100% oxygen Intubation Type: IV induction Ventilation: Mask ventilation without difficulty LMA: LMA inserted LMA Size: 3.0 Number of attempts: 2 Placement Confirmation: positive ETCO2 and breath sounds checked- equal and bilateral Tube secured with: Tape Dental Injury: Teeth and Oropharynx as per pre-operative assessment  Comments: Unable to seat LMA #4, LMA #3 placed easily; araumatic

## 2014-08-18 NOTE — Op Note (Signed)
Preop diagnosis: Poor IV access Postop diagnosis: Same Procedure performed:  Left subclavian vein port placement Surgeon:Antwane Grose K. Anesthesia: Gen. via LMA Indications: This is a 39 year old female with severe multiple sclerosis who receives periodic intravenous infusion therapy. She is having a very difficult time with IV access. We have been asked to place a port for IV access.  Description of procedure: The patient is brought to the operating room and placed in a supine position operative table. After an adequate level of general anesthesia was obtained, her arms were tucked at her sides. A roll was placed behind her shoulders. Her left chest was prepped with chlor prep and draped sterile fashion. A timeout was taken to ensure the proper patient and proper procedure. She was placed in Trendelenburg position. I palpated the jugular notch as well as the clavicle. We used an 18-gauge needle to cannulate the left subclavian vein below the left clavicle. There was good blood return of dark nonpulsatile blood. The wire was passed under fluoroscopic guidance into this. A caval. The needle was removed. We created a subcutaneous tissues pocket inferior to the insertion site. I excised some of the subcutaneous fat. An 8 French [place] port was assembled and was passed from the subcutaneous pouch up through the insertion site. The port was secured with 2-0 Prolene sutures.  Using fluoroscopy, we estimated the length the catheter and I cut this at 18 cm. This should be in the distal superior vena cava just above the cavoatrial junction. The dilator and breakaway sheath were passed over the wire under fluoroscopic guidance. The dilator and wire were removed. The catheter was passed through the sheath and the sheath was removed. Fluoroscopy confirmed that there are no kinks or compression of the catheter. We had good blood return and the catheter flushed easily. The wound was inspected for hemostasis. The wound  was closed with 3-0 Vicryl and 4-0 Monocryl. The insertion site was closed with 4-0 Monocryl. Benzoin Steri-Strips were applied. The port was then accessed and again there was good blood return. We flushed the port and instilled concentrated heparin solution. We left the port accessed and covered all the tubing with a Tegaderm dressing. The patient is scheduled for infusion on Friday.  The patient is extubated and brought to the recovery room in stable condition. All sponge, initially, and needle counts are correct.  Imogene Burn. [name], MD, [facility] Surgery  General/ Trauma Surgery  08/18/2014 12:49 PM

## 2014-08-18 NOTE — Interval H&P Note (Signed)
History and Physical Interval Note:  08/18/2014 9:42 AM  Ann Oconnor  has presented today for surgery, with the diagnosis of Multiple sclerosis; difficult IV access  The various methods of treatment have been discussed with the patient and family. After consideration of risks, benefits and other options for treatment, the patient has consented to  Procedure(s): [place] (Left) as a surgical intervention .  The patient's history has been reviewed, patient examined, no change in status, stable for surgery.  I have reviewed the patient's chart and labs.  Questions were answered to the patient's satisfaction.     Uri Turnbough K.

## 2014-08-18 NOTE — Transfer of Care (Signed)
Immediate Anesthesia Transfer of Care Note  Patient: Ann Oconnor  Procedure(s) Performed: Procedure(s): LEFT SUBCLAVIAN VEIN PORT PLACEMENT (Left)  Patient Location: PACU  Anesthesia Type:General  Level of Consciousness: awake, alert  and oriented  Airway & Oxygen Therapy: Patient Spontanous Breathing and Patient connected to nasal cannula oxygen  Post-op Assessment: Report given to RN and Post -op Vital signs reviewed and stable  Post vital signs: Reviewed and stable  Last Vitals:  Filed Vitals:   08/18/14 0921  BP: 96/63  Pulse: 70  Temp: 36.9 C  Resp: 18    Complications: No apparent anesthesia complications

## 2014-08-18 NOTE — Anesthesia Postprocedure Evaluation (Signed)
  Anesthesia Post-op Note  Patient: Ann Oconnor  Procedure(s) Performed: Procedure(s): LEFT SUBCLAVIAN VEIN PORT PLACEMENT (Left)  Patient Location: PACU  Anesthesia Type:General  Level of Consciousness: awake  Airway and Oxygen Therapy: Patient Spontanous Breathing  Post-op Pain: none  Post-op Assessment: Post-op Vital signs reviewed, Patient's Cardiovascular Status Stable, Respiratory Function Stable, Patent Airway, No signs of Nausea or vomiting and Pain level controlled              Post-op Vital Signs: Reviewed and stable  Last Vitals:  Filed Vitals:   08/18/14 1358  BP: 126/62  Pulse: 65  Temp:   Resp: 16    Complications: No apparent anesthesia complications

## 2014-08-20 ENCOUNTER — Encounter (HOSPITAL_COMMUNITY): Payer: Self-pay

## 2014-08-20 ENCOUNTER — Other Ambulatory Visit: Payer: Self-pay

## 2014-08-20 ENCOUNTER — Encounter (HOSPITAL_COMMUNITY)
Admission: RE | Admit: 2014-08-20 | Discharge: 2014-08-20 | Disposition: A | Discharge status: Home / Self Care | Payer: BLUE CROSS/BLUE SHIELD | Source: Ambulatory Visit | Attending: Neurology | Admitting: Neurology

## 2014-08-20 DIAGNOSIS — G35 Multiple sclerosis: Secondary | ICD-10-CM | POA: Diagnosis not present

## 2014-08-20 LAB — HEPATIC FUNCTION PANEL
ALBUMIN: 3.9 g/dL (ref 3.5–5.0)
ALT: 10 U/L — AB (ref 14–54)
AST: 17 U/L (ref 15–41)
Alkaline Phosphatase: 70 U/L (ref 38–126)
Bilirubin, Direct: <0.1 mg/dL — ABNORMAL LOW (ref 0.1–0.5)
Total Bilirubin: 0.5 mg/dL (ref 0.3–1.2)
Total Protein: 7.3 g/dL (ref 6.5–8.1)

## 2014-08-20 MED ORDER — SODIUM CHLORIDE 0.9 % IV SOLN: Freq: Once | INTRAVENOUS | Status: DC

## 2014-08-20 MED ORDER — ACETAMINOPHEN 325 MG PO TABS
650.0000 mg | ORAL_TABLET | Freq: Once | ORAL | Status: AC
  Administered 2014-08-20: 650 mg via ORAL
  Filled 2014-08-20: qty 2

## 2014-08-20 MED ORDER — SODIUM CHLORIDE 0.9 % IJ SOLN
10.0000 mL | INTRAMUSCULAR | Status: DC | PRN
  Administered 2014-08-20: 10 mL
  Filled 2014-08-20: qty 10

## 2014-08-20 MED ORDER — DIPHENHYDRAMINE HCL 25 MG PO TABS
50.0000 mg | ORAL_TABLET | Freq: Once | ORAL | Status: AC
  Administered 2014-08-20: 50 mg via ORAL
  Filled 2014-08-20 (×2): qty 2

## 2014-08-20 MED ORDER — SODIUM CHLORIDE 0.9 % IV SOLN
8.0000 mg | INTRAVENOUS | Status: DC | PRN
  Administered 2014-08-20: 8 mg via INTRAVENOUS
  Filled 2014-08-20 (×2): qty 4

## 2014-08-20 MED ORDER — HEPARIN SOD (PORK) LOCK FLUSH 100 UNIT/ML IV SOLN
500.0000 [IU] | INTRAVENOUS | Status: DC | PRN
  Administered 2014-08-20: 500 [IU]
  Filled 2014-08-20: qty 5

## 2014-08-20 MED ORDER — SODIUM CHLORIDE 0.9 % IV SOLN
27.0000 mg | Freq: Once | INTRAVENOUS | Status: AC
  Administered 2014-08-20: 28 mg via INTRAVENOUS
  Filled 2014-08-20: qty 14

## 2014-08-20 MED ORDER — HEPARIN SOD (PORK) LOCK FLUSH 100 UNIT/ML IV SOLN: 500.0000 [IU] | INTRAVENOUS | Status: DC

## 2014-08-20 NOTE — Discharge Instructions (Signed)
PAC (portacath) steri strips will fall off in 7-10 days.Notify MD for any increased swelling drainage fever or increased pain at [facility] Site site Your urine will be greenish blue after the Novantone infusion    NOVANTRONE Mitoxantrone injection What is this medicine? MITOXANTRONE (MYE toe ZAN trone) is a chemotherapy drug. It targets fast dividing cells, like cancer cells, and causes these cells to die. This medicine is used to treat acute nonlymphocytic leukemia (ANLL) and advanced prostate cancer. It is also used to treat certain types of multiple sclerosis. This medicine may be used for other purposes; ask your health care provider or pharmacist if you have questions. COMMON BRAND NAME(S): Novantrone What should I tell my health care provider before I take this medicine? They need to know if you have any of these conditions: -heart disease -infection (especially virus infection such as chickenpox or herpes) -liver disease -low blood counts, like low platelets, red blood cells, white blood cells -previous chemotherapy, especially with doxorubicin, daunorubicin, epirubicin, or idarubicin -recent or ongoing radiation therapy -an unusual or allergic reaction to mitoxantrone, other medicines, foods, dyes, or preservatives -pregnant or are trying to get pregnant -breast-feeding How should I use this medicine? This drug is given as an infusion into a vein. It is administered in a hospital or clinic by a specially trained health care professional. If you have pain, swelling, burning or any unusual feeling around the site of your injection, tell your health care professional right away. Talk to your pediatrician regarding the use of this medicine in children. Special care may be needed. Overdosage: If you think you have taken too much of this medicine contact a poison control center or emergency room at once. NOTE: This medicine is only for you. Do not share this medicine with others. What if I miss a  dose? It is important not to miss your dose. Call your doctor or health care professional if you are unable to keep an appointment. What may interact with this medicine? -ciprofloxacin -cyclosporine -medicines to increase blood counts like filgrastim, pegfilgrastim, sargramostim -other chemotherapy drugs like daunorubicin, doxorubicin, epirubicin, idarubicin, trastuzumab -vaccines Talk to your doctor or health care professional before taking any of these medicines: -acetaminophen -aspirin -ibuprofen -ketoprofen -naproxen This list may not describe all possible interactions. Give your health care provider a list of all the medicines, herbs, non-prescription drugs, or dietary supplements you use. Also tell them if you smoke, drink alcohol, or use illegal drugs. Some items may interact with your medicine. What should I watch for while using this medicine? Your condition will be monitored carefully while you are receiving this medicine. You will need important blood work done while you are taking this medicine. This drug may make you feel generally unwell. This is not uncommon, as chemotherapy can affect healthy cells as well as cancer cells. Report any side effects. Continue your course of treatment even though you feel ill unless your doctor tells you to stop. Call your doctor or health care professional for advice if you get a fever, chills or sore throat, or other symptoms of a cold or flu. Do not treat yourself. This drug decreases your body's ability to fight infections. Try to avoid being around people who are sick. This medicine may increase your risk to bruise or bleed. Call your doctor or health care professional if you notice any unusual bleeding. Be careful brushing and flossing your teeth or using a toothpick because you may get an infection or bleed more easily. If you have  any dental work done, tell your dentist you are receiving this medicine. Avoid taking products that contain  aspirin, acetaminophen, ibuprofen, naproxen, or ketoprofen unless instructed by your doctor. These medicines may hide a fever. Your urine may turn blue-green for a few days after your dose. This is normal with this medicine. Do not become pregnant while taking this medicine. Women should inform their doctor if they wish to become pregnant or think they might be pregnant. There is a potential for serious side effects to an unborn child. Take a pregnancy test as directed before each dose of this medicine. Talk to your health care professional or pharmacist for more information. Do not breast-feed an infant while taking this medicine. There is a maximum amount of this medicine you should receive throughout your life. The amount depends on the medical condition being treated and your overall health. Your doctor will watch how much of this medicine you receive in your lifetime. Tell your doctor if you have taken this medicine before. What side effects may I notice from receiving this medicine? Side effects that you should report to your doctor or health care professional as soon as possible: -allergic reactions like skin rash, itching or hives, swelling of the face, lips, or tongue -low blood counts - this medicine may decrease the number of white blood cells, red blood cells and platelets. You may be at increased risk for infections and bleeding. -signs of infection - fever or chills, cough, sore throat, pain or difficulty passing urine -signs of decreased platelets or bleeding - bruising, pinpoint red spots on the skin, black, tarry stools, blood in the urine -signs of decreased red blood cells - unusually weak or tired, fainting spells, lightheadedness -breathing problems -changes in vision -chest pain -fast, irregular heartbeat -mouth sores -nausea, vomiting -pain, swelling, redness at site where injected -swelling of the ankles, feet, hands -yellowing of the eyes or skin Side effects that usually  do not require medical attention (report to your doctor or health care professional if they continue or are bothersome): -blue color in the whites of your eyes -constipation -diarrhea -hair loss -loss of appetite -missed menstrual periods -nail discoloration or damage -stomach upset This list may not describe all possible side effects. Call your doctor for medical advice about side effects. You may report side effects to FDA at 1-800-FDA-1088. Where should I keep my medicine? This drug is given in a hospital or clinic and will not be stored at home. NOTE: This sheet is a summary. It may not cover all possible information. If you have questions about this medicine, talk to your doctor, pharmacist, or health care provider.  2015, Elsevier/Gold Standard. (2012-06-17 11:52:56) Multiple Sclerosis Multiple sclerosis (MS) is a disease of the central nervous system. It leads to the loss of the insulating covering of the nerves (myelin sheath) of your brain. When this happens, brain signals do not get sent properly or may not get sent at all. The age of onset of MS varies.  CAUSES The cause of MS is unknown. However, it is more common in the [place] than in the [place]. RISK FACTORS There is a higher number of women with MS than men. MS is not an illness that is passed down to you from your family members (inherited). However, your risk of MS is higher if you have a relative with MS. SIGNS AND SYMPTOMS  The symptoms of MS occur in episodes or attacks. These attacks may last weeks to months.  There may be long periods of almost no symptoms between attacks. The symptoms of MS vary. This is because of the many different ways it affects the central nervous system. The main symptoms of MS include:  Vision problems and eye pain.  Numbness.  Weakness.  Inability to move your arms, hands, feet, or legs (paralysis).  Balance problems.  Tremors. DIAGNOSIS  Your health  care provider can diagnose MS with the help of imaging exams and lab tests. These may include specialized X-ray exams and spinal fluid tests. The best imaging exam to confirm a diagnosis of MS is an MRI. TREATMENT  There is no known cure for MS, but there are medicines that can decrease the number and frequency of attacks. Steroids are often used for short-term relief. Physical and occupational therapy may also help. There are also many new alternative or complementary treatments available to help control the symptoms of MS. Ask your health care provider if any of these other options are right for you. HOME CARE INSTRUCTIONS   Take medicines as directed by your health care provider.  Exercise as directed by your health care provider. SEEK MEDICAL CARE IF: You begin to feel depressed. SEEK IMMEDIATE MEDICAL CARE IF:  You develop paralysis.  You have problems with bladder, bowel, or sexual function.  You develop mental changes, such as forgetfulness or mood swings.  You have a period of uncontrolled movements (seizure). Document Released: 02/17/2000 Document Revised: 02/24/2013 Document Reviewed: 10/27/2012 [facility] Patient Information 2015 [place], [place]. This information is not intended to replace advice given to you by your health care provider. Make sure you discuss any questions you have with your health care provider.

## 2014-08-20 NOTE — Progress Notes (Addendum)
Pt arrived today via w/c accompanied by Mom. Pt awake and alert. Current weight obtained. Pt was able to pivot transfer to recliner with assistance. EKG obtained ( as per pharmacy request prior to Novantrone infusion and copy sent to pharmacy). Left Chest single PAC with HPN intact in port from insertion in surgery on 08/18/14  Dressing CDI  From port. 5 ml waste blood drawn and 3 ml drawn for liver panel (as requested by pharmacy pre Novantone infusion). Other labs requested by pharmacy were drawn pre-op on 08/18/14 and are viewable in [facility] PAC flushed with 33ml saline and connected to NS at [facility] per pump.

## 2014-08-20 NOTE — Progress Notes (Signed)
Uneventful 2nd Novantrone infusion. PAC was flushed and deaccessed per protocol, Steri strips and some bruising at site.Pt was assisted to w/c and discharged accompanied by Mom to [place] [address]

## 2014-08-31 ENCOUNTER — Telehealth: Payer: Self-pay | Admitting: *Deleted

## 2014-08-31 NOTE — Telephone Encounter (Signed)
Thanks for calling her... She is doing very well

## 2014-08-31 NOTE — Telephone Encounter (Signed)
I called Ann Oconnor for an update since last chemo infusion.  She reports she continues to feel stronger--is now walking short distances with her walker at home.  She sounds positive--sts. she doesn't need anything right now but will call if she does.  Next chemo is scheduled for 11-19-14 at [facility].  She needs ekg, echo, cbc with diff and cmp repeated 1-2 weeks prior to infusion/fim

## 2014-09-02 ENCOUNTER — Telehealth: Payer: Self-pay | Admitting: Neurology

## 2014-09-02 DIAGNOSIS — R260 Ataxic gait: Secondary | ICD-10-CM

## 2014-09-02 DIAGNOSIS — G35 Multiple sclerosis: Secondary | ICD-10-CM

## 2014-09-02 DIAGNOSIS — R200 Anesthesia of skin: Secondary | ICD-10-CM

## 2014-09-02 DIAGNOSIS — R29898 Other symptoms and signs involving the musculoskeletal system: Secondary | ICD-10-CM

## 2014-09-02 NOTE — Telephone Encounter (Signed)
LMOM for Ann Oconnor that I am ordering a new referral for gait/balance/strength, to go to [facility], per pt's request.  She does not need to return this call unless she has questions/fim

## 2014-09-02 NOTE — Telephone Encounter (Signed)
Patient called stating she needs orders to restart physical therapy at home with [facility]. Please call and advise. Patient can be reached at [phone].

## 2014-09-15 ENCOUNTER — Telehealth: Payer: Self-pay | Admitting: Neurology

## 2014-09-15 NOTE — Telephone Encounter (Signed)
Patient called and requested to speak with Faith RN regarding some questions she has. Please call and advise.

## 2014-09-15 NOTE — Telephone Encounter (Signed)
I have spoken with Karmel this afternoon--she needs her PAC flushed.  Otila Kluver in the infusion suite will do this for her--appt. given for 09-20-14 at 10am/fim

## 2014-09-20 ENCOUNTER — Encounter: Payer: Self-pay | Admitting: *Deleted

## 2014-09-27 ENCOUNTER — Encounter: Payer: Self-pay | Admitting: Neurology

## 2014-09-27 ENCOUNTER — Ambulatory Visit (INDEPENDENT_AMBULATORY_CARE_PROVIDER_SITE_OTHER): Payer: BLUE CROSS/BLUE SHIELD | Admitting: Neurology

## 2014-09-27 ENCOUNTER — Telehealth: Payer: Self-pay | Admitting: Neurology

## 2014-09-27 VITALS — BP 116/78 | HR 76 | Resp 18 | Ht 64.0 in | Wt 222.2 lb

## 2014-09-27 DIAGNOSIS — Z79899 Other long term (current) drug therapy: Secondary | ICD-10-CM

## 2014-09-27 DIAGNOSIS — G35 Multiple sclerosis: Secondary | ICD-10-CM | POA: Diagnosis not present

## 2014-09-27 DIAGNOSIS — G5602 Carpal tunnel syndrome, left upper limb: Secondary | ICD-10-CM | POA: Diagnosis not present

## 2014-09-27 DIAGNOSIS — N3941 Urge incontinence: Secondary | ICD-10-CM

## 2014-09-27 DIAGNOSIS — R200 Anesthesia of skin: Secondary | ICD-10-CM

## 2014-09-27 DIAGNOSIS — R208 Other disturbances of skin sensation: Secondary | ICD-10-CM

## 2014-09-27 NOTE — Telephone Encounter (Signed)
Patient called and requested to speak with Faith RN regarding some numbness she is experiencing in her left hand. She states that this has been going on for almost a week. Please call and advise.

## 2014-09-27 NOTE — Telephone Encounter (Signed)
I have spoken with Ann Oconnor this morning.  She c/o onset one week ago of left hand numbness.  All fingers numb.  Appt. given to see Dr. Felecia Shelling this afternoon at 1430/fim

## 2014-09-27 NOTE — Progress Notes (Signed)
[facility]  PATIENT: Ann Oconnor DOB: 23-Jun-1975  REFERRING DOCTOR OR PCP:  Delia Chimes SOURCE: Patient, records, images  _________________________________   HISTORICAL  CHIEF COMPLAINT:  Chief Complaint  Patient presents with  . Multiple Sclerosis    Sts. she has had increased numbness left hand, all fingers one week ago.  Sts. numbness has not worsened or improvede; has been the same since it started/fim    HISTORY OF PRESENT ILLNESS:  Ann Oconnor is a 39 yo woman with a very aggressive MS.    She underwent her second Novantrone treatment in [place].   She is doing much better now is able to use a walker.  Gait/strength/sensation:    She is now able to walk about 100 feet with the walker --  Only 3 months ago was unable to walk.   Right leg is still worse.   The numbness in her legs resolved but numbness in the left hand started last week.     Numbness is worse in the fingers on that hand, palm worse than dorsum.   She denies any weakness.     She is on gabapentin 400 mg twice a day with benefit   Bladder/bowel:   She reports both bowel and bladder incontinence, but has done better the past month.   No recent incontinence  Vision is fine  Fatigue/sleep:  She feels less fatigue than at the last visit.   This worsens a little bit as the day goes on.   She is able to sleep fairly well at night.  Mood/cognition:   She notes much improved depression.  No tearfulness. There denies anxiety. She is on Paxil and has been on that for many years. She is also on clonazepam. Her cognitive issues have returned to baseline.    MS:    She presented in 2002 with numbness in her legs and poor gait.  The MRI scan showed MS plaques in the spinal cord and the brain and she was diagnosed with multiple sclerosis. She was initially placed on Rebif. At first she did well with only a couple of small central exacerbations. However, 2003 she had a more severe exacerbation that  affected her gait. She was placed on a combination Rebif and Copaxone for a short period of time and then was on Copaxone monotherapy. She switched to [place] in 2011 due to needle fatigue. She had numbness in her feet that would not go away and was switched to T[place] from [place] in November 2015.  In January 2016, she lost strength in both legs and urinary incontinence. She had a course of IV steroids without any benefit in late January. Because there was no benefit, she was admitted to [facility] for a course of plasmapheresis starting February 5. She got no benefit from the plasmapheresis. During the next week, she was home but she was doing worse and worse. She then presented to [facility]. She was admitted for another week of steroids. An MRI done at admission showed very aggressive MS changes.    Brain 04/28/2014 shows multiple infratentorial plaques including left greater than right middle cerebellar peduncles, bilateral pons, and cerebellum. Additionally there are multiple foci in the hemispheres, some with a concentric demyelinating pattern that could be consistent with Balo's concentric sclerosis, an especially fulminant form of multiple sclerosis. She was unable to complete the examination or get contrast due to the need to terminate the study early.   When compared to an MRI dated  02/07/2012, there has been dramatic change with multiple new brain stem and hemispheric foci.  MRI of the cervical spine 10/26/2010. showed multiple plaques:  right at the C2 level, left aspect C2-3 level, right aspect C3 level, the posterior central aspect C3 level, upper C6 level slightly greater to the right and C7 level greater to the left.  Abnormal signal within the left aspect of the cord at the T3- T4 level.   MRI of the brain in 05/19/2014 showed several additional posterior fossa lesions.     REVIEW OF SYSTEMS: Constitutional: No fevers, chills, sweats, or change in appetite.  Fatigue Eyes: No visual changes,  double vision, eye pain Ear, nose and throat: No hearing loss, ear pain, nasal congestion, sore throat Cardiovascular: No chest pain, palpitations Respiratory: No shortness of breath at rest or with exertion.   No wheezes GastrointestinaI: No nausea, vomiting.  Some fecal incontinence Genitourinary: as above. Musculoskeletal: No neck pain, back pain Integumentary: No rash, pruritus, skin lesions Neurological: as above Psychiatric: Notes depression at this time.  Some anxiety Endocrine: No palpitations, diaphoresis, change in appetite, change in weigh or increased thirst Hematologic/Lymphatic: No anemia, purpura, petechiae. Allergic/Immunologic: No itchy/runny eyes, nasal congestion, recent allergic reactions, rashes  ALLERGIES: No Known Allergies  HOME MEDICATIONS:  Current outpatient prescriptions:  .  baclofen (LIORESAL) 10 MG tablet, Take 1 tablet (10 mg total) by mouth 3 (three) times daily., Disp: 90 each, Rfl: 3 .  clonazePAM (KLONOPIN) 0.5 MG tablet, Take one tablet by mouth twice daily for anxiety. Hold for sedation (Patient taking differently: Take 0.5 mg by mouth daily. Hold for sedation), Disp: 60 tablet, Rfl: 5 .  gabapentin (NEURONTIN) 400 MG capsule, Take 1 capsule (400 mg total) by mouth 2 (two) times daily., Disp: 60 capsule, Rfl: 3 .  Mitoxantrone HCl (NOVANTRONE IV), Inject 27 mg into the vein every 3 (three) months. At Short Stay at [facility], Disp: , Rfl:  .  PARoxetine (PAXIL-CR) 37.5 MG 24 hr tablet, TAKE 1 TABLET BY MOUTH DAILY, Disp: 15 tablet, Rfl: 0 .  polyethylene glycol (MIRALAX / GLYCOLAX) packet, Take 17 g by mouth daily as needed for mild constipation., Disp: 14 each, Rfl: 0 .  HYDROcodone-acetaminophen (NORCO/VICODIN) 5-325 MG per tablet, Take 1 tablet by mouth every 4 (four) hours as needed. (Patient not taking: Reported on 09/27/2014), Disp: 40 tablet, Rfl: 0  PAST MEDICAL HISTORY: Past Medical History  Diagnosis Date  . MS (multiple sclerosis)  2002  . LGSIL (low grade squamous intraepithelial dysplasia)   . Movement disorder   . Vision abnormalities   . Family history of adverse reaction to anesthesia     Father - difficulty awaken- has sleep apena  . Seizures 2003  . Head injury, closed, with brief LOC 2003    fall  . Anxiety   . UTI (lower urinary tract infection)   . Gait instability   . Incontinence     PAST SURGICAL HISTORY: Past Surgical History  Procedure Laterality Date  . Cervical cone biopsy  2008    CIN 2  . Portacath placement Left 08/18/14  . Portacath placement Left 08/18/2014    Procedure: LEFT SUBCLAVIAN VEIN PORT PLACEMENT;  Surgeon: Donnie Mesa, MD;  Location: [facility] OR;  Service: General;  Laterality: Left;    FAMILY HISTORY: Family History  Problem Relation Age of Onset  . Thyroid disease Mother   . Thyroid disease Sister   . Cancer Paternal Aunt     not sure what kind,  maybe cervix  . Thyroid disease Sister   . Heart disease Father   . Mitral valve prolapse Father     SOCIAL HISTORY:  History   Social History  . Marital Status: Single    Spouse Name: N/A  . Number of Children: N/A  . Years of Education: N/A   Occupational History  . Not on file.   Social History Main Topics  . Smoking status: Never Smoker   . Smokeless tobacco: Never Used  . Alcohol Use: No  . Drug Use: No  . Sexual Activity: No   Other Topics Concern  . Not on file   Social History Narrative     PHYSICAL EXAM  Filed Vitals:   09/27/14 1425  BP: 116/78  Pulse: 76  Resp: 18  Height: 5\' 4"  (1.626 m)  Weight: 222 lb 3.2 oz (100.789 kg)    Body mass index is 38.12 kg/(m^2).   General: The patient is well-developed and well-nourished and in no acute distress  Cardiovascular: The heart has a regular rate and rhythm with a normal S1 and S2. There were no murmurs, gallops or rubs.    Neurologic Exam  Mental status: The patient is alert and oriented x 3 at the time of the examination. The patient  has apparent normal recent and remote memory,  attention span and concentration ability now seem normal.   Speech is normal.     Cranial nerves: Extraocular movements are full. Facial symmetry is present. There is good facial sensation to soft touch bilaterally.Facial strength is normal.  Trapezius and sternocleidomastoid strength is normal. No dysarthria is noted.  The tongue is midline, and the patient has symmetric elevation of the soft palate. No obvious hearing deficits are noted.  Motor:  Muscle bulk is normal.   Tone is mildly increased on the rightg. Strength is  5/5 in right arm, 5/5, left arm.   4 right leg and 4+/5 left leg  Sensory: Sensory testing is intact to pinprick, soft touch and vibration sensation in all 4 extremities.  Coordination: Cerebellar testing reveals reduced finger-nose-finger and RAM, worse on right.  Reduced right worse than left heel-to-shin bilaterally.  Gait and station: She needs to use her hands to stand up. When she is up she is able to walk fairly well with a good stride using her walker..   Reflexes: Deep tendon reflexes are increased bilaterally, slightly more on the right.       DIAGNOSTIC DATA (LABS, IMAGING, TESTING) - I reviewed patient records, labs, notes, testing and imaging myself where available.  Lab Results  Component Value Date   WBC 4.5 08/18/2014   HGB 12.5 08/18/2014   HCT 37.8 08/18/2014   MCV 92.9 08/18/2014   PLT 318 08/18/2014      Component Value Date/Time   NA 141 08/18/2014 0952   NA 141 05/17/2014   K 3.9 08/18/2014 0952   CL 105 08/18/2014 0952   CO2 28 08/18/2014 0952   GLUCOSE 98 08/18/2014 0952   BUN 7 08/18/2014 0952   BUN 7 05/17/2014   CREATININE 0.74 08/18/2014 0952   CREATININE 0.5 05/17/2014   CREATININE 0.75 09/02/2012 1057   CALCIUM 9.5 08/18/2014 0952   PROT 7.3 08/20/2014 0815   PROT 6.5 07/06/2014 1044   ALBUMIN 3.9 08/20/2014 0815   AST 17 08/20/2014 0815   ALT 10* 08/20/2014 0815   ALKPHOS  70 08/20/2014 0815   BILITOT 0.5 08/20/2014 0815   BILITOT 0.2 07/06/2014 1044  GFRNONAA >60 08/18/2014 0952   GFRAA >60 08/18/2014 0952   Lab Results  Component Value Date   CHOL 217* 02/09/2013   HDL 45 02/09/2013   LDLCALC 146* 02/09/2013   TRIG 130 02/09/2013   CHOLHDL 4.8 02/09/2013   No results found for: HGBA1C Lab Results  Component Value Date   VITAMINB12 302 09/14/2008   Lab Results  Component Value Date   TSH 2.871 08/21/2011       ASSESSMENT AND PLAN  Multiple sclerosis  Numbness of both lower extremities  High risk medication use  Urge incontinence of urine  Left carpal tunnel syndrome    She is doing much better since her Novantrone 6 weeks ago. Specifically she has much better like strength especially on the left and her cognition has improved, though not to baseline.  1.   ECHO before next infusion. 2.  Obtain a wrist splint for the left hand to wear at night and when at rest 3.   Inject left carpal tunnel with 1 mg Decadron in lidocaine 4.    Remain active. 5.   We discussed that we will likely do 4-6 treatments of Novantrone over the next year to year and a half. Then due to CHF and leukemia risk we will likely switch to another agent.  Richard A. Felecia Shelling, MD, PhD 8/58/8502, 7:74 PM Certified in Neurology, Clinical Neurophysiology, Sleep Medicine, Pain Medicine and Neuroimaging  [facility] [address] [place], [place] [address] [phone]  BSA = 2.28  Dose 12 mg/M2 = 27.3

## 2014-10-01 ENCOUNTER — Telehealth: Payer: Self-pay | Admitting: Neurology

## 2014-10-01 NOTE — Telephone Encounter (Signed)
Called back to pt that if she would like [place] to start prescribing the medication, Dr. Felecia Shelling has to be informed and he has to be comfortable with it. I can provide one week clonazepam for her and she needs call back next week and discuss with Dr. Felecia Shelling.  She stated that she has been off the medication since Wednesday and she is OK to wait until Monday to discuss with Dr. Felecia Shelling.   Rosalin Hawking, MD PhD Stroke Neurology 10/01/2014 3:54 PM

## 2014-10-01 NOTE — Telephone Encounter (Signed)
Patient called requesting refill for clonazePAM (KLONOPIN) 0.5 MG tablet . She got this medication last from Dr Arthur Holms at [facility] 07/02/14. She is requesting to Dr Felecia Shelling only to fill it. Patient can be reached at [phone].

## 2014-10-01 NOTE — Telephone Encounter (Signed)
I'm fine writing this     Clonazepam 0.5 mg nightly   #30  #3

## 2014-10-01 NOTE — Telephone Encounter (Signed)
I called the patient back.  Got no answer.  Left message. 

## 2014-10-01 NOTE — Telephone Encounter (Signed)
I called back.  Patient said she has been taking Clonazepam 0.5mg  tablets one daily.  This was previously prescribed by Dr Mariea Clonts.  She would like our office to start prescribing this drug.  Explained Dr Felecia Shelling was out of the office today, and we will send message to [facility] for review.  She was agreeable to this.

## 2014-10-01 NOTE — Telephone Encounter (Signed)
Pt called back to talk about medication. Please call [phone]. Thank you

## 2014-10-04 ENCOUNTER — Telehealth: Payer: Self-pay | Admitting: Neurology

## 2014-10-04 ENCOUNTER — Other Ambulatory Visit: Payer: Self-pay | Admitting: Neurology

## 2014-10-04 ENCOUNTER — Other Ambulatory Visit: Payer: Self-pay

## 2014-10-04 MED ORDER — CLONAZEPAM 0.5 MG PO TABS: 0.5000 mg | ORAL_TABLET | Freq: Every day | ORAL | Status: DC

## 2014-10-04 NOTE — Telephone Encounter (Deleted)
Pt called and is having trouble getting clonazePAM (KLONOPIN) 0.5 MG tablet refilled. Her pharmacy told her to call and talk with the Dr. About it. The pharmacy will not refill for her. Please call and advise

## 2014-10-04 NOTE — Telephone Encounter (Signed)
Rx has been signed and faxed  

## 2014-10-04 NOTE — Telephone Encounter (Signed)
Dr Felecia Shelling-- Pt has not received this Rx. It looks like it was printed. Do you have this Rx? Thank you

## 2014-10-04 NOTE — Telephone Encounter (Signed)
I don't recall whether I signed it or not. I will reprint and we can leave but upfront.

## 2014-10-04 NOTE — Telephone Encounter (Signed)
Patient is calling as her Ann Oconnor has not received her Rx Klonopin 0.5 mg.  Please call.

## 2014-10-04 NOTE — Telephone Encounter (Signed)
I called back.  They are aware the Rx will be faxed after it has been signed by the provider.  Since it is a controlled substance, unfortunately, it cannot be sent electronically.

## 2014-10-05 NOTE — Telephone Encounter (Signed)
Called pt to let her know we will refax Rx for Klonopin. She stated her pharmacy received Rx yesterday and she does not need it. She already filled Rx.

## 2014-10-06 ENCOUNTER — Telehealth: Payer: Self-pay

## 2014-10-06 ENCOUNTER — Ambulatory Visit: Payer: BLUE CROSS/BLUE SHIELD | Admitting: Neurology

## 2014-10-06 NOTE — Telephone Encounter (Signed)
PT HAS UPCOMING APPT. FOR ECHO . I CALLED PATIENT TO CHECK WHAT TYPE OF INSURANCE SHE HAS. I LEFT VOICEMAIL FOR PT TO RETURN PHONE CALL WITH INFORMATION DAJ

## 2014-10-06 NOTE — Telephone Encounter (Signed)
Pt states that she has [employer] insurance but she did not have her card with her so i advised her to bring to appt

## 2014-10-13 ENCOUNTER — Other Ambulatory Visit: Payer: Self-pay

## 2014-10-13 ENCOUNTER — Ambulatory Visit (HOSPITAL_COMMUNITY): Payer: BLUE CROSS/BLUE SHIELD | Attending: Cardiology

## 2014-10-13 DIAGNOSIS — G35 Multiple sclerosis: Secondary | ICD-10-CM | POA: Insufficient documentation

## 2014-10-13 DIAGNOSIS — E669 Obesity, unspecified: Secondary | ICD-10-CM | POA: Diagnosis not present

## 2014-10-13 DIAGNOSIS — Z6838 Body mass index (BMI) 38.0-38.9, adult: Secondary | ICD-10-CM | POA: Diagnosis not present

## 2014-10-13 DIAGNOSIS — Z79899 Other long term (current) drug therapy: Secondary | ICD-10-CM | POA: Diagnosis not present

## 2014-10-18 ENCOUNTER — Telehealth: Payer: Self-pay | Admitting: Neurology

## 2014-10-18 DIAGNOSIS — M79602 Pain in left arm: Secondary | ICD-10-CM

## 2014-10-18 NOTE — Telephone Encounter (Signed)
I have spoken with Ann Oconnor and per RAS, advised that he would like to order an emg/ncs left arm, to definitively dx. left carpal tunnel syndrome.  She is agreeable.  Study ordered/fim

## 2014-10-18 NOTE — Telephone Encounter (Signed)
Patient called stating her left hand pain had increased and brace is not helping. Please call and advise. Patient can be reached at [phone].

## 2014-10-19 ENCOUNTER — Other Ambulatory Visit: Payer: Self-pay | Admitting: Neurology

## 2014-10-19 NOTE — Telephone Encounter (Signed)
Lesly Rubenstein, RN at 06/16/2014 3:57 PM     Status: Signed       Expand All Collapse All   Spoke with June who sts. Almyra Free is having 2-3 episodes daily of increased spasms in left hand only. She is taking Robaxin as rx'd, also taking Clonazepam once daily. Per RAS, advised June that Clarise should d/c Robaxin, and begin Baclofen 10mg  po tid

## 2014-11-01 ENCOUNTER — Telehealth: Payer: Self-pay | Admitting: Neurology

## 2014-11-01 NOTE — Telephone Encounter (Signed)
I have spoken with Ann Oconnor this morning.  She is seeing ortho this week for cts.  She now c/o numbness all toes of both feet, some into feet, onset 2 weeks ago, was intermittent at first, now constant.  No other sx.  She is scheduled for her next Novantrone infusion on 11-19-14 at [facility].  RAS is ooo this week,  but as this is a pt. with a particularly aggressive MS, on chemo, I will try to reach RAS to see what tx. plan he recommends/fim

## 2014-11-01 NOTE — Telephone Encounter (Signed)
Phone call from patient re: both feet numb, off and on x 2 weeks.

## 2014-11-01 NOTE — Telephone Encounter (Signed)
LMTC./fim 

## 2014-11-01 NOTE — Telephone Encounter (Signed)
Patient is returning your call.    Thanks

## 2014-11-05 NOTE — Telephone Encounter (Signed)
I have spoken with Ann Oconnor this morning and have given her an appt. for 11-09-14 at 0920 for numbness in toes.  She has an appt. with ortho on 11-10-14 for cts/fim

## 2014-11-09 ENCOUNTER — Other Ambulatory Visit: Payer: Self-pay | Admitting: *Deleted

## 2014-11-09 ENCOUNTER — Ambulatory Visit (INDEPENDENT_AMBULATORY_CARE_PROVIDER_SITE_OTHER): Payer: BLUE CROSS/BLUE SHIELD | Admitting: Neurology

## 2014-11-09 ENCOUNTER — Encounter: Payer: Self-pay | Admitting: Neurology

## 2014-11-09 ENCOUNTER — Encounter: Payer: BLUE CROSS/BLUE SHIELD | Admitting: Neurology

## 2014-11-09 VITALS — BP 106/70 | HR 80 | Resp 16 | Ht 64.0 in | Wt 216.0 lb

## 2014-11-09 DIAGNOSIS — G35 Multiple sclerosis: Secondary | ICD-10-CM

## 2014-11-09 DIAGNOSIS — F418 Other specified anxiety disorders: Secondary | ICD-10-CM | POA: Diagnosis not present

## 2014-11-09 DIAGNOSIS — Z79899 Other long term (current) drug therapy: Secondary | ICD-10-CM

## 2014-11-09 DIAGNOSIS — N39498 Other specified urinary incontinence: Secondary | ICD-10-CM | POA: Diagnosis not present

## 2014-11-09 DIAGNOSIS — R29898 Other symptoms and signs involving the musculoskeletal system: Secondary | ICD-10-CM

## 2014-11-09 NOTE — Progress Notes (Signed)
[facility]  PATIENT: Ann Oconnor General DOB: 1975/03/16  REFERRING DOCTOR OR PCP:  Delia Chimes SOURCE: Patient, records, images  _________________________________   HISTORICAL  CHIEF COMPLAINT:  Chief Complaint  Patient presents with  . Multiple Sclerosis    She is ambulatory with walker, steady gait today.  Next Novantrone infusion scheduled for 11-19-14.  She has had an echo in preparation for upcoming infusion.  She needs labs drawn on or after 9-10 for Novantrone.  She is here today with new c/o numbness all toes of both feet, extending some into the bottom of her feet, sometimes in the heels.  Onset 2 weeks ago.  Sts. sx. have been the same since onset, have not improved or worsened./fim    HISTORY OF PRESENT ILLNESS:  Ann Oconnor is a 39 yo woman with a very aggressive MS.    She underwent her second Novantrone treatment in [place].   Next infusion is later this month.   She is tolerating it well.  She is having echocardiograms showing stable ejection fraction.  Numbness:   She had the onset of left hand and right > left foot numbness 2-3 weeks ago.  She had a po steroid pack (ortho) and she is getting a NCV/EMG for the hand later this week (ortho).    She feels symptoms are unchanged compared to last week.Hand numbness is dorsum and palm.    Foot numbness is worse on the right and is mostly in her toes.     A CTS splint has not helped  Gait/strength:    She is much better and gets out of her house now.   She can climb a short flight of stairs.   She can go > 400 feet feet with the walker but only across room (poor balance, without one).    Right leg is still worse.   The numbness in her legs resolved but numbness in the left hand started last week.    She denies any weakness.     She is on gabapentin 400 mg twice a day with benefit   Bladder/bowel:   She reports only rare incontinence now.     Vision is fine  Fatigue/sleep:  She has mild fatigue.   This  worsens a little bit as the day goes on.   She is able to sleep fairly well at night.  Mood/cognition:   She is much improved since earlier this year and notes mild depression and anxiety on;y.  She remains on Paxil and has been on that for many years. She is also on clonazepam. Her cognitive issues have returned to baseline.    MS:    She presented in 2002 with numbness in her legs and poor gait.  The MRI scan showed MS plaques in the spinal cord and the brain and she was diagnosed with multiple sclerosis. She was initially placed on Rebif. At first she did well with only a couple of small central exacerbations. However, 2003 she had a more severe exacerbation that affected her gait. She was placed on a combination Rebif and Copaxone for a short period of time and then was on Copaxone monotherapy. She switched to [place] in 2011 due to needle fatigue. She had numbness in her feet that would not go away and was switched to Tecfidera from [place] in November 2015.  In January 2016, she lost strength in both legs and urinary incontinence. She had a course of IV steroids without any benefit in late  January. Because there was no benefit, she was admitted to [facility] for a course of plasmapheresis starting February 5. She got no benefit from the plasmapheresis. During the next week, she was home but she was doing worse and worse. She then presented to [facility]. She was admitted for another week of steroids. An MRI done at admission showed very aggressive MS changes.    Brain 04/28/2014 shows multiple infratentorial plaques including left greater than right middle cerebellar peduncles, bilateral pons, and cerebellum. Additionally there are multiple foci in the hemispheres, some with a concentric demyelinating pattern that could be consistent with Balo's concentric sclerosis, an especially fulminant form of multiple sclerosis. She was unable to complete the examination or get contrast due to the need to  terminate the study early.   When compared to an MRI dated 02/07/2012, there has been dramatic change with multiple new brain stem and hemispheric foci.  MRI of the cervical spine 10/26/2010. showed multiple plaques:  right at the C2 level, left aspect C2-3 level, right aspect C3 level, the posterior central aspect C3 level, upper C6 level slightly greater to the right and C7 level greater to the left.  Abnormal signal within the left aspect of the cord at the T3- T4 level.   MRI of the brain in 05/19/2014 showed several additional posterior fossa lesions prompting initiation of Novantrone.  Marland Kitchen     REVIEW OF SYSTEMS: Constitutional: No fevers, chills, sweats, or change in appetite.  Fatigue Eyes: No visual changes, double vision, eye pain Ear, nose and throat: No hearing loss, ear pain, nasal congestion, sore throat Cardiovascular: No chest pain, palpitations Respiratory: No shortness of breath at rest or with exertion.   No wheezes GastrointestinaI: No nausea, vomiting.  Some fecal incontinence Genitourinary: as above. Musculoskeletal: No neck pain, back pain Integumentary: No rash, pruritus, skin lesions Neurological: as above Psychiatric: Notes depression at this time.  Some anxiety Endocrine: No palpitations, diaphoresis, change in appetite, change in weigh or increased thirst Hematologic/Lymphatic: No anemia, purpura, petechiae. Allergic/Immunologic: No itchy/runny eyes, nasal congestion, recent allergic reactions, rashes  ALLERGIES: No Known Allergies  HOME MEDICATIONS:  Current outpatient prescriptions:  .  baclofen (LIORESAL) 10 MG tablet, TAKE 1 TABLET(10 MG) BY MOUTH THREE TIMES DAILY, Disp: 90 tablet, Rfl: 3 .  clonazePAM (KLONOPIN) 0.5 MG tablet, Take 1 tablet (0.5 mg total) by mouth daily., Disp: 30 tablet, Rfl: 5 .  gabapentin (NEURONTIN) 400 MG capsule, Take 1 capsule (400 mg total) by mouth 2 (two) times daily., Disp: 60 capsule, Rfl: 3 .  Mitoxantrone HCl (NOVANTRONE IV),  Inject 27 mg into the vein every 3 (three) months. At [facility] Stay at [facility], Disp: , Rfl:  .  PARoxetine (PAXIL-CR) 37.5 MG 24 hr tablet, TAKE 1 TABLET BY MOUTH DAILY, Disp: 15 tablet, Rfl: 0 .  HYDROcodone-acetaminophen (NORCO/VICODIN) 5-325 MG per tablet, Take 1 tablet by mouth every 4 (four) hours as needed. (Patient not taking: Reported on 11/09/2014), Disp: 40 tablet, Rfl: 0 .  polyethylene glycol (MIRALAX / GLYCOLAX) packet, Take 17 g by mouth daily as needed for mild constipation. (Patient not taking: Reported on 11/09/2014), Disp: 14 each, Rfl: 0  PAST MEDICAL HISTORY: Past Medical History  Diagnosis Date  . MS (multiple sclerosis) 2002  . LGSIL (low grade squamous intraepithelial dysplasia)   . Movement disorder   . Vision abnormalities   . Family history of adverse reaction to anesthesia     Father - difficulty awaken- has sleep apena  .  Seizures 2003  . Head injury, closed, with brief LOC 2003    fall  . Anxiety   . UTI (lower urinary tract infection)   . Gait instability   . Incontinence     PAST SURGICAL HISTORY: Past Surgical History  Procedure Laterality Date  . Cervical cone biopsy  2008    CIN 2  . Portacath placement Left 08/18/14  . Portacath placement Left 08/18/2014    Procedure: LEFT SUBCLAVIAN VEIN PORT PLACEMENT;  Surgeon: Donnie Mesa, MD;  Location: [facility] OR;  Service: General;  Laterality: Left;    FAMILY HISTORY: Family History  Problem Relation Age of Onset  . Thyroid disease Mother   . Thyroid disease Sister   . Cancer Paternal Aunt     not sure what kind, maybe cervix  . Thyroid disease Sister   . Heart disease Father   . Mitral valve prolapse Father     SOCIAL HISTORY:  Social History   Social History  . Marital Status: Single    Spouse Name: N/A  . Number of Children: N/A  . Years of Education: N/A   Occupational History  . Not on file.   Social History Main Topics  . Smoking status: Never Smoker   . Smokeless tobacco: Never  Used  . Alcohol Use: No  . Drug Use: No  . Sexual Activity: No   Other Topics Concern  . Not on file   Social History Narrative     PHYSICAL EXAM  Filed Vitals:   11/09/14 0947  BP: 106/70  Pulse: 80  Resp: 16  Height: 5\' 4"  (1.626 m)  Weight: 216 lb (97.977 kg)    Body mass index is 37.06 kg/(m^2).   General: The patient is well-developed and well-nourished and in no acute distress   Neurologic Exam  Mental status: The patient is alert and oriented x 3 at the time of the examination. The patient has apparent normal recent and remote memory,  attention span and concentration ability now seem normal.   Speech is normal.     Cranial nerves: Extraocular movements are full. Facial symmetry is present. There is good facial sensation to soft touch bilaterally.Facial strength is normal.  Trapezius and sternocleidomastoid strength is normal. No dysarthria is noted.  No obvious hearing deficits are noted.  Motor:  Muscle bulk is normal.   Tone is mildly increased on the rightg. Strength is  5/5 in right arm, 5/5, left arm.   4+ right leg (4/5 EHL) and 5/5 left leg  Sensory: Sensory testing is intact to pinprick, soft touch and vibration sensation in arms but decreased vibration and touch/temp in right foot below ankle (symmetric at ankles).  Coordination: Cerebellar testing reveals mild finger-nose-finger and RAM, worse on right.  Reduced right worse than left heel-to-shin bilaterally.  Gait and station: She needs to use her hands to stand up. When she is up she is able to walk fairly well with a good stride using her walker..   Reflexes: Deep tendon reflexes are increased bilaterally, slightly more on the right.       DIAGNOSTIC DATA (LABS, IMAGING, TESTING) - I reviewed patient records, labs, notes, testing and imaging myself where available.  Lab Results  Component Value Date   WBC 4.5 08/18/2014   HGB 12.5 08/18/2014   HCT 37.8 08/18/2014   MCV 92.9 08/18/2014   PLT  318 08/18/2014      Component Value Date/Time   NA 141 08/18/2014 0952   NA 141  05/17/2014   K 3.9 08/18/2014 0952   CL 105 08/18/2014 0952   CO2 28 08/18/2014 0952   GLUCOSE 98 08/18/2014 0952   BUN 7 08/18/2014 0952   BUN 7 05/17/2014   CREATININE 0.74 08/18/2014 0952   CREATININE 0.5 05/17/2014   CREATININE 0.75 09/02/2012 1057   CALCIUM 9.5 08/18/2014 0952   PROT 7.3 08/20/2014 0815   PROT 6.5 07/06/2014 1044   ALBUMIN 3.9 08/20/2014 0815   AST 17 08/20/2014 0815   ALT 10* 08/20/2014 0815   ALKPHOS 70 08/20/2014 0815   BILITOT 0.5 08/20/2014 0815   BILITOT 0.2 07/06/2014 1044   GFRNONAA >60 08/18/2014 0952   GFRAA >60 08/18/2014 0952   Lab Results  Component Value Date   CHOL 217* 02/09/2013   HDL 45 02/09/2013   LDLCALC 146* 02/09/2013   TRIG 130 02/09/2013   CHOLHDL 4.8 02/09/2013   No results found for: HGBA1C Lab Results  Component Value Date   VITAMINB12 302 09/14/2008   Lab Results  Component Value Date   TSH 2.871 08/21/2011       ASSESSMENT AND PLAN  Multiple sclerosis  Weakness of right lower extremity  High risk medication use  Depression with anxiety  Other urinary incontinence   She is doing much better since starting Novantrone for MS.  New symptoms likely represent mild exacerbation not severe enough to treat with steroid.    She will get NCV/EMG at Ortho and I asked her to have them forward results.    We discussed that we will likely do a total of 4-5 treatments of Novantrone q 3 month.    Then due to CHF and leukemia risk we will likely switch to another agent.  We discussed Zinbryta and Ocrelizumab.  RTC 3 months or sooner if problems.    Ann Oconnor A. Felecia Shelling, MD, PhD 05/10/486, 8:91 AM Certified in Neurology, Clinical Neurophysiology, Sleep Medicine, Pain Medicine and Neuroimaging  [facility] [address] [place], [place] [address] [phone]  BSA = 2.28  Dose 12 mg/M2 = 27.3

## 2014-11-16 ENCOUNTER — Other Ambulatory Visit (INDEPENDENT_AMBULATORY_CARE_PROVIDER_SITE_OTHER): Payer: Self-pay

## 2014-11-16 DIAGNOSIS — G35 Multiple sclerosis: Secondary | ICD-10-CM

## 2014-11-16 DIAGNOSIS — Z0289 Encounter for other administrative examinations: Secondary | ICD-10-CM

## 2014-11-16 DIAGNOSIS — Z79899 Other long term (current) drug therapy: Secondary | ICD-10-CM

## 2014-11-17 ENCOUNTER — Telehealth: Payer: Self-pay | Admitting: *Deleted

## 2014-11-17 LAB — BASIC METABOLIC PANEL WITH GFR
BUN/Creatinine Ratio: 9 (ref 9–23)
BUN: 7 mg/dL (ref 6–24)
CO2: 25 mmol/L (ref 18–29)
Calcium: 9.4 mg/dL (ref 8.7–10.2)
Chloride: 102 mmol/L (ref 97–108)
Creatinine, Ser: 0.75 mg/dL (ref 0.57–1.00)
GFR calc Af Amer: 115 mL/min/{1.73_m2}
GFR calc non Af Amer: 100 mL/min/{1.73_m2}
Glucose: 107 mg/dL — ABNORMAL HIGH (ref 65–99)
Potassium: 4.9 mmol/L (ref 3.5–5.2)
Sodium: 142 mmol/L (ref 134–144)

## 2014-11-17 LAB — CBC WITH DIFFERENTIAL/PLATELET
Basophils Absolute: 0 10*3/uL (ref 0.0–0.2)
Basos: 1 %
EOS (ABSOLUTE): 0.2 10*3/uL (ref 0.0–0.4)
Eos: 3 %
Hematocrit: 37.5 % (ref 34.0–46.6)
Hemoglobin: 12.1 g/dL (ref 11.1–15.9)
IMMATURE GRANS (ABS): 0 10*3/uL (ref 0.0–0.1)
Immature Granulocytes: 0 %
LYMPHS ABS: 0.9 10*3/uL (ref 0.7–3.1)
Lymphs: 16 %
MCH: 31.3 pg (ref 26.6–33.0)
MCHC: 32.3 g/dL (ref 31.5–35.7)
MCV: 97 fL (ref 79–97)
MONOS ABS: 0.4 10*3/uL (ref 0.1–0.9)
Monocytes: 7 %
Neutrophils Absolute: 4.2 10*3/uL (ref 1.4–7.0)
Neutrophils: 73 %
Platelets: 290 10*3/uL (ref 150–379)
RBC: 3.86 x10E6/uL (ref 3.77–5.28)
RDW: 14.4 % (ref 12.3–15.4)
WBC: 5.7 10*3/uL (ref 3.4–10.8)

## 2014-11-17 LAB — HEPATIC FUNCTION PANEL
ALK PHOS: 95 IU/L (ref 39–117)
ALT: 12 IU/L (ref 0–32)
AST: 15 IU/L (ref 0–40)
Albumin: 4.2 g/dL (ref 3.5–5.5)
BILIRUBIN TOTAL: 0.4 mg/dL (ref 0.0–1.2)
BILIRUBIN, DIRECT: 0.08 mg/dL (ref 0.00–0.40)
Total Protein: 6.9 g/dL (ref 6.0–8.5)

## 2014-11-17 NOTE — Telephone Encounter (Signed)
-----   Message from Britt Bottom, MD sent at 11/17/2014  8:46 AM EDT ----- Labs look fine. She can proceed with her next Novantrone dose

## 2014-11-17 NOTE — Telephone Encounter (Signed)
I have spoken with Ann Oconnor this afternoon and per RAS, advised that labs are ok--ok for Novantrone infusion on Friday.  She verbalized understanding of same/fim

## 2014-11-19 ENCOUNTER — Encounter (HOSPITAL_COMMUNITY): Payer: Self-pay

## 2014-11-19 ENCOUNTER — Encounter (HOSPITAL_COMMUNITY)
Admission: RE | Admit: 2014-11-19 | Discharge: 2014-11-19 | Disposition: A | Discharge status: Home / Self Care | Payer: BLUE CROSS/BLUE SHIELD | Source: Ambulatory Visit | Attending: Neurology | Admitting: Neurology

## 2014-11-19 DIAGNOSIS — G35 Multiple sclerosis: Secondary | ICD-10-CM | POA: Diagnosis present

## 2014-11-19 MED ORDER — ACETAMINOPHEN 325 MG PO TABS
650.0000 mg | ORAL_TABLET | Freq: Once | ORAL | Status: AC
  Administered 2014-11-19: 650 mg via ORAL
  Filled 2014-11-19: qty 2

## 2014-11-19 MED ORDER — SODIUM CHLORIDE 0.9 % IV SOLN
8.0000 mg | Freq: Once | INTRAVENOUS | Status: DC | PRN
  Filled 2014-11-19: qty 4

## 2014-11-19 MED ORDER — SODIUM CHLORIDE 0.9 % IJ SOLN
10.0000 mL | INTRAMUSCULAR | Status: DC | PRN
  Administered 2014-11-19: 10 mL
  Filled 2014-11-19: qty 10

## 2014-11-19 MED ORDER — HEPARIN SOD (PORK) LOCK FLUSH 100 UNIT/ML IV SOLN
500.0000 [IU] | INTRAVENOUS | Status: DC | PRN
  Administered 2014-11-19: 500 [IU]
  Filled 2014-11-19: qty 5

## 2014-11-19 MED ORDER — SODIUM CHLORIDE 0.9 % IV SOLN
27.0000 mg | Freq: Once | INTRAVENOUS | Status: AC
  Administered 2014-11-19: 28 mg via INTRAVENOUS
  Filled 2014-11-19: qty 14

## 2014-11-19 MED ORDER — SODIUM CHLORIDE 0.9 % IV SOLN
Freq: Once | INTRAVENOUS | Status: AC
  Administered 2014-11-19: 250 mL via INTRAVENOUS

## 2014-11-19 MED ORDER — SODIUM CHLORIDE 0.9 % IV SOLN
8.0000 mg | Freq: Once | INTRAVENOUS | Status: AC
  Administered 2014-11-19: 8 mg via INTRAVENOUS
  Filled 2014-11-19 (×2): qty 4

## 2014-11-19 NOTE — Discharge Instructions (Signed)
NOVANTRONE Mitoxantrone injection What is this medicine? MITOXANTRONE (MYE toe ZAN trone) is a chemotherapy drug. It targets fast dividing cells, like cancer cells, and causes these cells to die. This medicine is used to treat acute nonlymphocytic leukemia (ANLL) and advanced prostate cancer. It is also used to treat certain types of multiple sclerosis. This medicine may be used for other purposes; ask your health care provider or pharmacist if you have questions. COMMON BRAND NAME(S): Novantrone What should I tell my health care provider before I take this medicine? They need to know if you have any of these conditions: -heart disease -infection (especially virus infection such as chickenpox or herpes) -liver disease -low blood counts, like low platelets, red blood cells, white blood cells -previous chemotherapy, especially with doxorubicin, daunorubicin, epirubicin, or idarubicin -recent or ongoing radiation therapy -an unusual or allergic reaction to mitoxantrone, other medicines, foods, dyes, or preservatives -pregnant or are trying to get pregnant -breast-feeding How should I use this medicine? This drug is given as an infusion into a vein. It is administered in a hospital or clinic by a specially trained health care professional. If you have pain, swelling, burning or any unusual feeling around the site of your injection, tell your health care professional right away. Talk to your pediatrician regarding the use of this medicine in children. Special care may be needed. Overdosage: If you think you have taken too much of this medicine contact a poison control center or emergency room at once. NOTE: This medicine is only for you. Do not share this medicine with others. What if I miss a dose? It is important not to miss your dose. Call your doctor or health care professional if you are unable to keep an appointment. What may interact with this  medicine? -ciprofloxacin -cyclosporine -medicines to increase blood counts like filgrastim, pegfilgrastim, sargramostim -other chemotherapy drugs like daunorubicin, doxorubicin, epirubicin, idarubicin, trastuzumab -vaccines Talk to your doctor or health care professional before taking any of these medicines: -acetaminophen -aspirin -ibuprofen -ketoprofen -naproxen This list may not describe all possible interactions. Give your health care provider a list of all the medicines, herbs, non-prescription drugs, or dietary supplements you use. Also tell them if you smoke, drink alcohol, or use illegal drugs. Some items may interact with your medicine. What should I watch for while using this medicine? Your condition will be monitored carefully while you are receiving this medicine. You will need important blood work done while you are taking this medicine. This drug may make you feel generally unwell. This is not uncommon, as chemotherapy can affect healthy cells as well as cancer cells. Report any side effects. Continue your course of treatment even though you feel ill unless your doctor tells you to stop. Call your doctor or health care professional for advice if you get a fever, chills or sore throat, or other symptoms of a cold or flu. Do not treat yourself. This drug decreases your body's ability to fight infections. Try to avoid being around people who are sick. This medicine may increase your risk to bruise or bleed. Call your doctor or health care professional if you notice any unusual bleeding. Be careful brushing and flossing your teeth or using a toothpick because you may get an infection or bleed more easily. If you have any dental work done, tell your dentist you are receiving this medicine. Avoid taking products that contain aspirin, acetaminophen, ibuprofen, naproxen, or ketoprofen unless instructed by your doctor. These medicines may hide a fever. Your urine  may turn blue-green for a  few days after your dose. This is normal with this medicine. Do not become pregnant while taking this medicine. Women should inform their doctor if they wish to become pregnant or think they might be pregnant. There is a potential for serious side effects to an unborn child. Take a pregnancy test as directed before each dose of this medicine. Talk to your health care professional or pharmacist for more information. Do not breast-feed an infant while taking this medicine. There is a maximum amount of this medicine you should receive throughout your life. The amount depends on the medical condition being treated and your overall health. Your doctor will watch how much of this medicine you receive in your lifetime. Tell your doctor if you have taken this medicine before. What side effects may I notice from receiving this medicine? Side effects that you should report to your doctor or health care professional as soon as possible: -allergic reactions like skin rash, itching or hives, swelling of the face, lips, or tongue -low blood counts - this medicine may decrease the number of white blood cells, red blood cells and platelets. You may be at increased risk for infections and bleeding. -signs of infection - fever or chills, cough, sore throat, pain or difficulty passing urine -signs of decreased platelets or bleeding - bruising, pinpoint red spots on the skin, black, tarry stools, blood in the urine -signs of decreased red blood cells - unusually weak or tired, fainting spells, lightheadedness -breathing problems -changes in vision -chest pain -fast, irregular heartbeat -mouth sores -nausea, vomiting -pain, swelling, redness at site where injected -swelling of the ankles, feet, hands -yellowing of the eyes or skin Side effects that usually do not require medical attention (report to your doctor or health care professional if they continue or are bothersome): -blue color in the whites of your  eyes -constipation -diarrhea -hair loss -loss of appetite -missed menstrual periods -nail discoloration or damage -stomach upset This list may not describe all possible side effects. Call your doctor for medical advice about side effects. You may report side effects to FDA at 1-800-FDA-1088. Where should I keep my medicine? This drug is given in a hospital or clinic and will not be stored at home. NOTE: This sheet is a summary. It may not cover all possible information. If you have questions about this medicine, talk to your doctor, pharmacist, or health care provider.  2015, [employer]vier/Gold Standard. (2012-06-17 11:52:56)

## 2014-11-19 NOTE — Progress Notes (Signed)
Uneventful infusion of premeds and Novantrone. Pt arrived today ambulatory using rolling walker accompanied by Mom. She declined offer of w/c at time of discharge stating she wanted to walk. She was discharged ambulatory using rolling walker accompanied by Mom to elevator to go to main lobby. Next scheduled appointment for Novantrone is in 3 months on 02/23/15

## 2014-11-24 ENCOUNTER — Other Ambulatory Visit: Payer: Self-pay | Admitting: Neurology

## 2014-11-29 ENCOUNTER — Telehealth: Payer: Self-pay | Admitting: Neurology

## 2014-11-29 DIAGNOSIS — R29898 Other symptoms and signs involving the musculoskeletal system: Secondary | ICD-10-CM

## 2014-11-29 DIAGNOSIS — G35 Multiple sclerosis: Secondary | ICD-10-CM

## 2014-11-29 DIAGNOSIS — R269 Unspecified abnormalities of gait and mobility: Secondary | ICD-10-CM

## 2014-11-29 NOTE — Telephone Encounter (Signed)
Pt would like a referral for PT, she would like to go to the same place as last time. She is also wondering if she can have Rx for anxiety. Please call and advise [phone]

## 2014-11-29 NOTE — Telephone Encounter (Signed)
Referral made to [facility] for PT, as request/fim

## 2014-11-30 ENCOUNTER — Encounter: Payer: Self-pay | Admitting: Gastroenterology

## 2014-11-30 MED ORDER — DIAZEPAM 5 MG PO TABS: 5.0000 mg | ORAL_TABLET | Freq: Two times a day (BID) | ORAL | Status: DC | PRN

## 2014-11-30 NOTE — Telephone Encounter (Signed)
Rx. up front [facility]/fim 

## 2014-11-30 NOTE — Telephone Encounter (Signed)
Per RAS, pt. may stop Clonazepam and start Valium 5mg  po bid.  I have spoken with Ann Oconnor; she is agreeable with this plan.  Rx. printed, on RAS ledge to be signed/fim

## 2014-11-30 NOTE — Telephone Encounter (Signed)
LMTC./fim 

## 2014-11-30 NOTE — Telephone Encounter (Addendum)
Pt called and wanted to know if she is able to have rx for anxiety. Please call and advise [phone]

## 2014-11-30 NOTE — Telephone Encounter (Signed)
Patient returned your call.

## 2014-11-30 NOTE — Telephone Encounter (Signed)
I have spoken with Ann Oconnor this morning--she sts. despite Clonazepam once daily and Paxil, she is still feeling some anxious, feeling easily upset and cries easily; wants to know if there is something else she can try.  I will check with RAS and call her back.  She is aware it is a very busy day and it may be tomorrow before I am able to call her back/fim

## 2014-11-30 NOTE — Addendum Note (Signed)
Addended by: France Ravens I on: 11/30/2014 02:45 PM   Modules accepted: Orders

## 2014-11-30 NOTE — Addendum Note (Signed)
Addended by: France Ravens I on: 11/30/2014 02:42 PM   Modules accepted: Orders, Medications

## 2014-12-06 ENCOUNTER — Telehealth: Payer: Self-pay | Admitting: Neurology

## 2014-12-06 DIAGNOSIS — G35 Multiple sclerosis: Secondary | ICD-10-CM

## 2014-12-06 DIAGNOSIS — R260 Ataxic gait: Secondary | ICD-10-CM

## 2014-12-06 NOTE — Telephone Encounter (Signed)
Patient is calling as she needs an order/referral for PT faxed to [facility]. Phone [phone]. Thanks!

## 2014-12-06 NOTE — Telephone Encounter (Signed)
I have spoke with Ann Oconnor.  Referral for PT recently ordered to be sent to [name].  She now sts. she prefers orders be sent to [facility].  Referral ordered as requested/fim

## 2014-12-22 ENCOUNTER — Other Ambulatory Visit: Payer: Self-pay | Admitting: Neurology

## 2014-12-23 NOTE — Telephone Encounter (Signed)
Previously auth by Kyra Searles on 09.21

## 2014-12-24 ENCOUNTER — Ambulatory Visit (INDEPENDENT_AMBULATORY_CARE_PROVIDER_SITE_OTHER): Payer: BLUE CROSS/BLUE SHIELD | Admitting: Neurology

## 2014-12-24 ENCOUNTER — Encounter: Payer: Self-pay | Admitting: Neurology

## 2014-12-24 VITALS — BP 106/80 | HR 76 | Resp 14 | Ht 64.0 in | Wt 218.0 lb

## 2014-12-24 DIAGNOSIS — N3946 Mixed incontinence: Secondary | ICD-10-CM | POA: Diagnosis not present

## 2014-12-24 DIAGNOSIS — R208 Other disturbances of skin sensation: Secondary | ICD-10-CM

## 2014-12-24 DIAGNOSIS — G35 Multiple sclerosis: Secondary | ICD-10-CM | POA: Diagnosis not present

## 2014-12-24 DIAGNOSIS — R260 Ataxic gait: Secondary | ICD-10-CM | POA: Diagnosis not present

## 2014-12-24 DIAGNOSIS — Z79899 Other long term (current) drug therapy: Secondary | ICD-10-CM | POA: Diagnosis not present

## 2014-12-24 DIAGNOSIS — F418 Other specified anxiety disorders: Secondary | ICD-10-CM | POA: Diagnosis not present

## 2014-12-24 DIAGNOSIS — R200 Anesthesia of skin: Secondary | ICD-10-CM

## 2014-12-24 NOTE — Progress Notes (Signed)
[facility]  PATIENT: Ann Oconnor DOB: Aug 31, 1975  REFERRING DOCTOR OR PCP:  Delia Chimes SOURCE: Patient, records, images  _________________________________   HISTORICAL  CHIEF COMPLAINT:  Chief Complaint  Patient presents with  . Multiple Sclerosis    Sts. she continues to tolerate Novantrone well--last infusion 11-19-14.  Sts. is still having some numbness in her feet and left hand.  O/W denies new or worsening sx/fim    HISTORY OF PRESENT ILLNESS:  Ann Oconnor is a 39 yo woman with a very aggressive MS.    She underwent her third Novantrone treatment in Mid-September.   Next infusion will be 02/23/15.   She is tolerating it well.  She is having echocardiograms showing stable ejection fraction.  Numbness:   She has left hand and both feet numbness x 2 months.   NCV was reportedly normal at Orthopedics.  Symptoms unchanged since onset.   The onset was fairly sudden.     Hand numbness is dorsum and palm.    Foot numbness is worse on the right and is mostly in her toes.      We discussed that this might be due to a spinal relapse  Gait/strength:    She is able to walk a mile with her walker.  She uses it for balance much mor ethan strength.     She can climb a short flight of stairs.   She is driving now short distances  Right leg is still worse.  Feet are numb but legs are not.    She denies any weakness.   She has spasticity if she sits a long time  She is on gabapentin 400 mg twice a day with benefit   Bladder/bowel:   She reports no incontinence now.     No significant frequency.    Vision is fine  Fatigue/sleep:  She has mild fatigue.   This worsens a little bit as the day goes on.   She is able to sleep fairly well at night.  Mood/cognition:   Mood is much improved since earlier this year.   Now denies depression and only mild anxiety.     She remains on Paxil and has been on that for many years. She stopped clonazepam and rarely takes valium. Her  cognitive issues have returned to baseline.    MS:    She presented in 2002 with numbness in her legs and poor gait.  The MRI scan showed MS plaques in the spinal cord and the brain and she was diagnosed with multiple sclerosis. She was initially placed on Rebif. At first she did well with only a couple of small central exacerbations. However, 2003 she had a more severe exacerbation that affected her gait. She was placed on a combination Rebif and Copaxone for a short period of time and then was on Copaxone monotherapy. She switched to [place] in 2011 due to needle fatigue. She had numbness in her feet that would not go away and was switched to [place] from [place] in November 2015.  In January 2016, she lost strength in both legs and urinary incontinence. She had a course of IV steroids without any benefit in late January. Because there was no benefit, she was admitted to [facility] for a course of plasmapheresis starting February 5. She got no benefit from the plasmapheresis. During the next week, she was home but she was doing worse and worse. She then presented to [facility]. She was admitted for another week  of steroids. An MRI done at admission showed very aggressive MS changes.    Brain 04/28/2014 shows multiple infratentorial plaques including left greater than right middle cerebellar peduncles, bilateral pons, and cerebellum. Additionally there are multiple foci in the hemispheres, some with a concentric demyelinating pattern that could be consistent with Balo's concentric sclerosis, an especially fulminant form of multiple sclerosis. She was unable to complete the examination or get contrast due to the need to terminate the study early.   When compared to an MRI dated 02/07/2012, there has been dramatic change with multiple new brain stem and hemispheric foci.  MRI of the cervical spine 10/26/2010. showed multiple plaques:  right at the C2 level, left aspect C2-3 level, right aspect C3 level, the  posterior central aspect C3 level, upper C6 level slightly greater to the right and C7 level greater to the left.  Abnormal signal within the left aspect of the cord at the T3- T4 level.   MRI of the brain in 05/19/2014 showed several additional posterior fossa lesions prompting initiation of Novantrone.  Marland Kitchen     REVIEW OF SYSTEMS: Constitutional: No fevers, chills, sweats, or change in appetite.  Fatigue Eyes: No visual changes, double vision, eye pain Ear, nose and throat: No hearing loss, ear pain, nasal congestion, sore throat Cardiovascular: No chest pain, palpitations Respiratory: No shortness of breath at rest or with exertion.   No wheezes GastrointestinaI: No nausea, vomiting.  Some fecal incontinence Genitourinary: as above. Musculoskeletal: No neck pain, back pain Integumentary: No rash, pruritus, skin lesions Neurological: as above Psychiatric: Notes depression at this time.  Some anxiety Endocrine: No palpitations, diaphoresis, change in appetite, change in weigh or increased thirst Hematologic/Lymphatic: No anemia, purpura, petechiae. Allergic/Immunologic: No itchy/runny eyes, nasal congestion, recent allergic reactions, rashes  ALLERGIES: No Known Allergies  HOME MEDICATIONS:  Current outpatient prescriptions:  .  diclofenac (VOLTAREN) 75 MG EC tablet, TK 1 T PO  BID AFTER MEALS FOR IMFLAMMATION/ PAIN/ SWELLING, Disp: , Rfl: 3 .  gabapentin (NEURONTIN) 400 MG capsule, TAKE 1 CAPSULE(400 MG) BY MOUTH TWICE DAILY, Disp: 60 capsule, Rfl: 6 .  Mitoxantrone HCl (NOVANTRONE IV), Inject 27 mg into the vein every 3 (three) months. At Short Stay at [facility], Disp: , Rfl:  .  PARoxetine (PAXIL-CR) 37.5 MG 24 hr tablet, TAKE 1 TABLET BY MOUTH DAILY, Disp: 15 tablet, Rfl: 0 .  polyethylene glycol (MIRALAX / GLYCOLAX) packet, Take 17 g by mouth daily as needed for mild constipation., Disp: 14 each, Rfl: 0 .  baclofen (LIORESAL) 10 MG tablet, TAKE 1 TABLET(10 MG) BY MOUTH THREE  TIMES DAILY (Patient not taking: Reported on 12/24/2014), Disp: 90 tablet, Rfl: 3 .  diazepam (VALIUM) 5 MG tablet, Take 1 tablet (5 mg total) by mouth 2 (two) times daily as needed for anxiety. (Patient not taking: Reported on 12/24/2014), Disp: 60 tablet, Rfl: 5 .  HYDROcodone-acetaminophen (NORCO/VICODIN) 5-325 MG per tablet, Take 1 tablet by mouth every 4 (four) hours as needed. (Patient not taking: Reported on 11/09/2014), Disp: 40 tablet, Rfl: 0  PAST MEDICAL HISTORY: Past Medical History  Diagnosis Date  . MS (multiple sclerosis) ([place]) 2002  . LGSIL (low grade squamous intraepithelial dysplasia)   . Movement disorder   . Vision abnormalities   . Family history of adverse reaction to anesthesia     Father - difficulty awaken- has sleep apena  . Seizures ([place]) 2003  . Head injury, closed, with brief LOC ([place]) 2003    fall  .  Anxiety   . UTI (lower urinary tract infection)   . Gait instability   . Incontinence     PAST SURGICAL HISTORY: Past Surgical History  Procedure Laterality Date  . Cervical cone biopsy  2008    CIN 2  . Portacath placement Left 08/18/14  . Portacath placement Left 08/18/2014    Procedure: LEFT SUBCLAVIAN VEIN PORT PLACEMENT;  Surgeon: Donnie Mesa, MD;  Location: [facility] OR;  Service: General;  Laterality: Left;    FAMILY HISTORY: Family History  Problem Relation Age of Onset  . Thyroid disease Mother   . Thyroid disease Sister   . Cancer Paternal Aunt     not sure what kind, maybe cervix  . Thyroid disease Sister   . Heart disease Father   . Mitral valve prolapse Father     SOCIAL HISTORY:  Social History   Social History  . Marital Status: Single    Spouse Name: N/A  . Number of Children: N/A  . Years of Education: N/A   Occupational History  . Not on file.   Social History Main Topics  . Smoking status: Never Smoker   . Smokeless tobacco: Never Used  . Alcohol Use: No  . Drug Use: No  . Sexual Activity: No   Other Topics Concern    . Not on file   Social History Narrative     PHYSICAL EXAM  Filed Vitals:   12/24/14 0858  BP: 106/80  Pulse: 76  Resp: 14  Height: 5\' 4"  (1.626 m)  Weight: 218 lb (98.884 kg)    Body mass index is 37.4 kg/(m^2).   General: The patient is well-developed and well-nourished and in no acute distress   Neurologic Exam  Mental status: The patient is alert and oriented x 3 at the time of the examination. The patient has apparent normal recent and remote memory,  attention span and concentration ability now seem normal.   Speech is normal.     Cranial nerves: Extraocular movements are full. Facial symmetry is present. There is good facial sensation to soft touch bilaterally.Facial strength is normal.  Trapezius and sternocleidomastoid strength is normal. No dysarthria is noted.  No obvious hearing deficits are noted.  Motor:  Muscle bulk is normal.   Tone is mildly increased on the rightg. Strength is  5/5 in right arm, 5/5, left arm.   4+ right leg (4/5 EHL) and 5/5 left leg  Sensory: Sensory testing is intact to pinprick, soft touch and vibration sensation in arms but decreased vibration and touch/temp in right foot below ankle (symmetric at ankles).  Coordination: Cerebellar testing reveals mild finger-nose-finger and RAM, worse on right.  Reduced right worse than left heel-to-shin bilaterally.  Gait and station: She needs to use her hands to stand up. When she is up she is able to walk fairly well with a good stride using her walker..   Reflexes: Deep tendon reflexes are increased bilaterally, slightly more on the right.       DIAGNOSTIC DATA (LABS, IMAGING, TESTING) - I reviewed patient records, labs, notes, testing and imaging myself where available.  Lab Results  Component Value Date   WBC 5.7 11/16/2014   HGB 12.5 08/18/2014   HCT 37.5 11/16/2014   MCV 92.9 08/18/2014   PLT 318 08/18/2014      Component Value Date/Time   NA 142 11/16/2014 1015   NA 141  08/18/2014 0952   K 4.9 11/16/2014 1015   CL 102 11/16/2014 1015  CO2 25 11/16/2014 1015   GLUCOSE 107* 11/16/2014 1015   GLUCOSE 98 08/18/2014 0952   BUN 7 11/16/2014 1015   BUN 7 08/18/2014 0952   CREATININE 0.75 11/16/2014 1015   CREATININE 0.5 05/17/2014   CREATININE 0.75 09/02/2012 1057   CALCIUM 9.4 11/16/2014 1015   PROT 6.9 11/16/2014 1015   PROT 7.3 08/20/2014 0815   ALBUMIN 4.2 11/16/2014 1015   ALBUMIN 3.9 08/20/2014 0815   AST 15 11/16/2014 1015   ALT 12 11/16/2014 1015   ALKPHOS 95 11/16/2014 1015   BILITOT 0.4 11/16/2014 1015   BILITOT 0.5 08/20/2014 0815   GFRNONAA 100 11/16/2014 1015   GFRAA 115 11/16/2014 1015   Lab Results  Component Value Date   CHOL 217* 02/09/2013   HDL 45 02/09/2013   LDLCALC 146* 02/09/2013   TRIG 130 02/09/2013   CHOLHDL 4.8 02/09/2013   No results found for: HGBA1C Lab Results  Component Value Date   VITAMINB12 302 09/14/2008   Lab Results  Component Value Date   TSH 2.871 08/21/2011       ASSESSMENT AND PLAN  Multiple sclerosis (HCC)  High risk medication use  Mixed incontinence  Depression with anxiety  1.   Continue Novantrone at the same dose. Her next infusion will be in December. That will be her fourth infusion and I will consider switching her to another medication earlier in the year to reduce the cardiac toxicity and leukemia risk of prolonged use of Novantrone. 2.   We will set up another echocardiogram and do lab work today. 3.   MRI of the brain and spinal cord to assess the current aggressiveness of her MS and possibility of subclinical progression. Based on findings, we might decide to switch to a different medication earlier. 4.   We discussed Zinbryta and Ocrelizumab as possible medications to switch to.  RTC 3 -4 months or sooner if problems.    Richard A. Felecia Shelling, MD, PhD 80/22/3361, 2:24 AM Certified in Neurology, Clinical Neurophysiology, Sleep Medicine, Pain Medicine and  Neuroimaging  [facility] [address] [place], [place] [address] [phone]  BSA = 2.28  Dose 12 mg/M2 = 27.35

## 2014-12-25 LAB — CBC WITH DIFFERENTIAL/PLATELET
BASOS ABS: 0 10*3/uL (ref 0.0–0.2)
BASOS: 1 %
EOS (ABSOLUTE): 0.1 10*3/uL (ref 0.0–0.4)
Eos: 2 %
Hematocrit: 37.5 % (ref 34.0–46.6)
Hemoglobin: 12.3 g/dL (ref 11.1–15.9)
Immature Grans (Abs): 0 10*3/uL (ref 0.0–0.1)
Immature Granulocytes: 0 %
Lymphocytes Absolute: 0.9 10*3/uL (ref 0.7–3.1)
Lymphs: 23 %
MCH: 31.6 pg (ref 26.6–33.0)
MCHC: 32.8 g/dL (ref 31.5–35.7)
MCV: 96 fL (ref 79–97)
MONOS ABS: 0.4 10*3/uL (ref 0.1–0.9)
Monocytes: 9 %
NEUTROS ABS: 2.7 10*3/uL (ref 1.4–7.0)
NEUTROS PCT: 65 %
PLATELETS: 375 10*3/uL (ref 150–379)
RBC: 3.89 x10E6/uL (ref 3.77–5.28)
RDW: 14.5 % (ref 12.3–15.4)
WBC: 4.1 10*3/uL (ref 3.4–10.8)

## 2014-12-25 LAB — COMPREHENSIVE METABOLIC PANEL
A/G RATIO: 2 (ref 1.1–2.5)
ALK PHOS: 87 IU/L (ref 39–117)
ALT: 14 IU/L (ref 0–32)
AST: 19 IU/L (ref 0–40)
Albumin: 4.6 g/dL (ref 3.5–5.5)
BILIRUBIN TOTAL: 0.3 mg/dL (ref 0.0–1.2)
BUN/Creatinine Ratio: 12 (ref 9–23)
BUN: 9 mg/dL (ref 6–24)
CHLORIDE: 103 mmol/L (ref 97–106)
CO2: 25 mmol/L (ref 18–29)
Calcium: 9.3 mg/dL (ref 8.7–10.2)
Creatinine, Ser: 0.74 mg/dL (ref 0.57–1.00)
GFR calc Af Amer: 117 mL/min/{1.73_m2} (ref >59)
GFR calc non Af Amer: 102 mL/min/{1.73_m2} (ref >59)
Globulin, Total: 2.3 g/dL (ref 1.5–4.5)
Glucose: 92 mg/dL (ref 65–99)
POTASSIUM: 5.3 mmol/L — AB (ref 3.5–5.2)
Sodium: 144 mmol/L (ref 136–144)
Total Protein: 6.9 g/dL (ref 6.0–8.5)

## 2014-12-27 ENCOUNTER — Telehealth (HOSPITAL_COMMUNITY): Payer: Self-pay | Admitting: *Deleted

## 2014-12-28 ENCOUNTER — Ambulatory Visit: Payer: BLUE CROSS/BLUE SHIELD | Admitting: Neurology

## 2014-12-31 NOTE — Telephone Encounter (Signed)
Error

## 2015-01-12 ENCOUNTER — Encounter: Payer: Self-pay | Admitting: Rehabilitation

## 2015-01-12 ENCOUNTER — Ambulatory Visit: Payer: BLUE CROSS/BLUE SHIELD | Attending: Neurology | Admitting: Rehabilitation

## 2015-01-12 DIAGNOSIS — R2681 Unsteadiness on feet: Secondary | ICD-10-CM | POA: Insufficient documentation

## 2015-01-12 DIAGNOSIS — R29898 Other symptoms and signs involving the musculoskeletal system: Secondary | ICD-10-CM | POA: Diagnosis present

## 2015-01-12 DIAGNOSIS — G35 Multiple sclerosis: Secondary | ICD-10-CM | POA: Diagnosis present

## 2015-01-12 DIAGNOSIS — R269 Unspecified abnormalities of gait and mobility: Secondary | ICD-10-CM | POA: Insufficient documentation

## 2015-01-12 NOTE — Therapy (Signed)
[facility] [address] [place], [place], [address] Phone: [phone]   Fax:  [phone]  Physical Therapy Evaluation  Patient Details  Name: Ann Oconnor MRN: [id number] Date of Birth: [birth year] Referring Provider: Arlice Colt, MD  Encounter Date: 01/12/2015      PT End of Session - 01/12/15 1919    Visit Number 1   Number of Visits 8  eval +7 visits   Date for PT Re-Evaluation 03/04/15   Authorization Type [employer] (pt feels she has 7 visits left til end of year, varifying with Lattie Haw on this)   Authorization - Number of Visits 7   PT Start Time 0930   PT Stop Time 1015   PT Time Calculation (min) 45 min   Activity Tolerance Patient tolerated treatment well   Behavior During Therapy [facility] for tasks assessed/performed      Past Medical History  Diagnosis Date  . MS (multiple sclerosis) ([place]) 2002  . LGSIL (low grade squamous intraepithelial dysplasia)   . Movement disorder   . Vision abnormalities   . Family history of adverse reaction to anesthesia     Father - difficulty awaken- has sleep apena  . Seizures ([place]) 2003  . Head injury, closed, with brief LOC ([place]) 2003    fall  . Anxiety   . UTI (lower urinary tract infection)   . Gait instability   . Incontinence     Past Surgical History  Procedure Laterality Date  . Cervical cone biopsy  2008    CIN 2  . Portacath placement Left 08/18/14  . Portacath placement Left 08/18/2014    Procedure: LEFT SUBCLAVIAN VEIN PORT PLACEMENT;  Surgeon: Donnie Mesa, MD;  Location: [place];  Service: General;  Laterality: Left;    There were no vitals filed for this visit.  Visit Diagnosis:  Multiple sclerosis ([place]) - Plan: PT plan of care cert/re-cert  Unsteadiness - Plan: PT plan of care cert/re-cert  Abnormality of gait - Plan: PT plan of care cert/re-cert  Left leg weakness - Plan: PT plan of care cert/re-cert      Subjective Assessment - 01/12/15 0937     Subjective "I would like to be able to walk without using the walker."  "I want to get my core stronger."     Pertinent History MS, anxiety    Limitations Walking;House hold activities;Lifting;Sitting   How long can you sit comfortably? gets stiff after she has been sitting for long periods of time   Patient Stated Goals "I want to walk without the walker."    Currently in Pain? No/denies            [facility] PT Assessment - 01/12/15 0001    Assessment   Medical Diagnosis MS   Referring Provider Arlice Colt, MD   Onset Date/Surgical Date --  Dec 2002   Prior Therapy HHPT   Precautions   Precautions Fall   Restrictions   Weight Bearing Restrictions No   Balance Screen   Has the patient fallen in the past 6 months Yes   How many times? 2   Has the patient had a decrease in activity level because of a fear of falling?  Yes   Is the patient reluctant to leave their home because of a fear of falling?  Yes   [place] Private residence   Living Arrangements Alone;Other (Comment)  parents live near her to check in on her   Available  Help at Discharge Family;Available PRN/intermittently   Type of Home Mobile home   Home Access Ramped entrance   [place] One level   [place] - 4 wheels;Shower seat;Wheelchair - manual   Prior Function   Level of Independence Independent with household mobility with device;Independent with community mobility with device   Vocation On disability  out of work since Feb.    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, watch TV   Cognition   Overall Cognitive Status Impaired/Different from baseline   Area of Impairment Memory   Memory Decreased short-term memory  reports memory is getting better   Sensation   Light Touch Impaired Detail   Light Touch Impaired Details Impaired LLE  impaired on lower aspect of tib/fib med and lat   Hot/Cold Appears Intact   per report   Proprioception Appears Intact   Coordination   Gross Motor Movements are Fluid and Coordinated Yes  grossly WFL, LLE weaker, slower   Fine Motor Movements are Fluid and Coordinated Yes   Heel Shin Test grossly WFL   ROM / Strength   AROM / PROM / Strength Strength   Strength   Overall Strength Within functional limits for tasks performed  hip flex strength 4-/5   Transfers   Transfers Sit to Stand;Stand to Sit   Sit to Stand 6: Modified independent (Device/Increase time)   Stand to Sit 6: Modified independent (Device/Increase time)   Ambulation/Gait   Ambulation/Gait Yes   Ambulation/Gait Assistance 6: Modified independent (Device/Increase time)   Ambulation Distance (Feet) 230 Feet  and another 55' w/ SBQC   Assistive device 4-wheeled walker;Small based quad cane   Gait Pattern Step-to pattern;Step-through pattern;Decreased arm swing - right;Decreased stance time - left;Decreased step length - right;Decreased stride length;Decreased hip/knee flexion - left;Trunk flexed  rigid gait pattern    Ambulation Surface Level;Indoor   Gait velocity 2.44 ft/sec   Standardized Balance Assessment   Standardized Balance Assessment Berg Balance Test   Berg Balance Test   Sit to Stand Able to stand without using hands and stabilize independently   Standing Unsupported Able to stand 2 minutes with supervision   Sitting with Back Unsupported but Feet Supported on Floor or Stool Able to sit safely and securely 2 minutes   Stand to Sit Sits safely with minimal use of hands   Transfers Able to transfer safely, minor use of hands   Standing Unsupported with Eyes Closed Able to stand 10 seconds with supervision   Standing Ubsupported with Feet Together Able to place feet together independently but unable to hold for 30 seconds   From Standing, Reach Forward with Outstretched Arm Can reach confidently >25 cm (10")   From Standing Position, Pick up Object from Floor Able to pick up shoe,  needs supervision   From Standing Position, Turn to Look Behind Over each Shoulder Looks behind from both sides and weight shifts well   Turn 360 Degrees Needs close supervision or verbal cueing   Standing Unsupported, Alternately Place Feet on Step/Stool Needs assistance to keep from falling or unable to try   Standing Unsupported, One Foot in Front Needs help to step but can hold 15 seconds   Standing on One Leg Tries to lift leg/unable to hold 3 seconds but remains standing independently   Total Score 38  PT Education - 01/12/15 1918    Education provided Yes   Education Details evaluation findings, POC, expected outcomes.    Person(s) Educated Patient   Methods Explanation   Comprehension Verbalized understanding          PT Short Term Goals - 01/12/15 1936    PT SHORT TERM GOAL #1   Title Pt will initiate HEP for BLE strengthening and balance to decrease fall risk and improve functional mobility.  (Target Date: 02/09/15)   PT SHORT TERM GOAL #2   Title Pt will increase gait speed to 3.04 ft/sec in order to indicate improved efficiency of gait and decrease fall risk. (Target Date: 02/09/15)   PT SHORT TERM GOAL #3   Title Pt will increase BERG balance score by 4 points to indicate functional improvement in balance and decreased fall risk.  (Target Date: 02/09/15)   PT SHORT TERM GOAL #4   Title Pt will ambulate 100' w/ SBQC in household simulated environment at S level to indicate improvement in functional mobility and decreasing reliance on AD.  (Target Date: 02/09/15)           PT Long Term Goals - 01/12/15 1940    PT LONG TERM GOAL #1   Title Pt will be independent with HEP for BLE strengthening and balance to indicate improved functional mobility and decreased fall risk. (Target Date: 03/02/15)   PT LONG TERM GOAL #2   Title Pt will increase BERG score to 45/56 in order to indicate functional improvement in balance and decreased  fall risk. (Target Date: 03/02/15)   PT LONG TERM GOAL #3   Title Pt will negotiate on paved outdoor surfaces with SBQC at mod I level in order to indicate improved functional mobility and increased independence. (Target Date: 03/02/15)   PT LONG TERM GOAL #4   Title Pt will report 3 ways to conserve energy with ADL's at home and in community.  (Target Date: 03/02/15)               Plan - 01/12/15 1926    Clinical Impression Statement Pt presents with aggressive form of MS (possibly Balo's concentric sclerosis per MD notes) with increased numbess and tingling in BLEs and LUE.  Note decreased mobility, increased fall risk and generalized weakness, esp in LLE during PT evaluation.  Note gait speed to be 2.44 ft/sec, indicative of limited community ambulator, TUG time of 16.63 secs, indicative of fall risk and BERG balance score of 38/56, indicative of fall risk.  Based on findings and deficits, feel pt is very appropriate for OP neuro PT services to address these deficits.    Pt will benefit from skilled therapeutic intervention in order to improve on the following deficits Abnormal gait;Decreased activity tolerance;Decreased balance;Decreased endurance;Decreased mobility;Decreased strength;Impaired perceived functional ability;Impaired sensation;Improper body mechanics;Postural dysfunction   Rehab Potential Good   Clinical Impairments Affecting Rehab Potential progressive disease   PT Frequency 1x / week   PT Duration Other (comment)  7 weeks   PT Treatment/Interventions ADLs/Self Care Home Management;DME Instruction;Gait training;Stair training;Functional mobility training;Therapeutic activities;Therapeutic exercise;Balance training;Neuromuscular re-education;Patient/family education;Energy conservation   PT Next Visit Plan Check for tone in BLEs, provide HEP for LLE strenthening, esp hip/knee flex, balance tasks   Consulted and Agree with Plan of Care Patient         Problem  List Patient Active Problem List   Diagnosis Date Noted  . Ataxic gait 12/24/2014  . Left carpal tunnel syndrome 09/27/2014  . Encounter for  chemotherapy management 07/06/2014  . High risk medication use 05/14/2014  . Urinary incontinence 05/14/2014  . Depression with anxiety 05/06/2014  . Lower extremity weakness 04/28/2014  . Numbness of both lower extremities 04/28/2014  . Multiple falls 04/28/2014  . Nausea & vomiting 04/28/2014  . Memory loss 12/03/2011  . Multiple sclerosis ([place]) 09/14/2008  . Constipation 09/14/2008  . RUQ PAIN 09/14/2008    Cameron Sprang, PT, MPT [facility] [address] [place], [place], [address] Phone: [phone]   Fax:  [phone] 01/12/2015, 7:49 PM  Name: Ann Oconnor MRN: [id number] Date of Birth: 02-Nov-1975

## 2015-01-17 ENCOUNTER — Encounter: Payer: Self-pay | Admitting: Rehabilitation

## 2015-01-17 ENCOUNTER — Ambulatory Visit: Payer: BLUE CROSS/BLUE SHIELD | Admitting: Rehabilitation

## 2015-01-17 DIAGNOSIS — R2681 Unsteadiness on feet: Secondary | ICD-10-CM

## 2015-01-17 DIAGNOSIS — R269 Unspecified abnormalities of gait and mobility: Secondary | ICD-10-CM

## 2015-01-17 DIAGNOSIS — R29898 Other symptoms and signs involving the musculoskeletal system: Secondary | ICD-10-CM

## 2015-01-17 DIAGNOSIS — G35 Multiple sclerosis: Secondary | ICD-10-CM | POA: Diagnosis not present

## 2015-01-17 NOTE — Therapy (Signed)
Ann Oconnor [address] [place], [place], [address] Phone: [phone]   Fax:  [phone]  Physical Therapy Treatment  Patient Details  Name: Ann Oconnor MRN: [id number] Date of Birth: Apr 28, 1975 Referring Provider: Arlice Colt, MD  Encounter Date: 01/17/2015      PT End of Session - 01/17/15 1025    Visit Number 2   Number of Visits 4  eval +3 visits, per insurance    Date for PT Re-Evaluation 03/04/15   Authorization Type [employer] (only has 4 visits total until end of year)   Authorization - Visit Number 2   Authorization - Number of Visits 4   PT Start Time 0930   PT Stop Time 1015   PT Time Calculation (min) 45 min   Activity Tolerance Patient tolerated treatment well   Behavior During Therapy [facility] for tasks assessed/performed      Past Medical History  Diagnosis Date  . MS (multiple sclerosis) ([place]) 2002  . LGSIL (low grade squamous intraepithelial dysplasia)   . Movement disorder   . Vision abnormalities   . Family history of adverse reaction to anesthesia     Father - difficulty awaken- has sleep apena  . Seizures ([place]) 2003  . Head injury, closed, with brief LOC ([place]) 2003    fall  . Anxiety   . UTI (lower urinary tract infection)   . Gait instability   . Incontinence     Past Surgical History  Procedure Laterality Date  . Cervical cone biopsy  2008    CIN 2  . Portacath placement Left 08/18/14  . Portacath placement Left 08/18/2014    Procedure: LEFT SUBCLAVIAN VEIN PORT PLACEMENT;  Surgeon: Donnie Mesa, MD;  Location: [place];  Service: General;  Laterality: Left;    There were no vitals filed for this visit.  Visit Diagnosis:  Unsteadiness  Abnormality of gait  Left leg weakness      Subjective Assessment - 01/17/15 0938    Subjective No changes since last visit.  Reports she has beeing increasing the amount she is walking at home.    Pertinent History MS, anxiety    Limitations Walking;House hold activities;Lifting;Sitting   How long can you sit comfortably? gets stiff after she has been sitting for long periods of time   Patient Stated Goals "I want to walk without the walker."    Currently in Pain? No/denies            Therex:  Initiated HEP with BLE strengthening, esp for LLE.  Also added postural exercises to encourage more upright posture, increased flexibility in upper traps, and strengthening in B rhomboids.  See pt instruction for full details on exercises performed during session.  Provided pt with green theraband for scapular retraction with instruction/demonstration on how to perform at home in doorway.    Gait:  Continue to address gait with use of tripod cane (4 small tips) x 115' at min/guard level.  Cues for stepping sequence starting with 3 point gait pattern working towards 2 point gait pattern.  Also provided cues for upright posture and increased heel to toe contact with initial contact phase of gait, as well as increased stance time on LLE as she tends to take quick step with RLE to avoid prolonged WB on LLE.                    PT Education - 01/17/15 1024    Education provided Yes  Education Details HEP, see pt instructions   Person(s) Educated Patient   Methods Explanation   Comprehension Verbalized understanding          PT Short Term Goals - 01/12/15 1936    PT SHORT TERM GOAL #1   Title Pt will initiate HEP for BLE strengthening and balance to decrease fall risk and improve functional mobility.  (Target Date: 02/09/15)   PT SHORT TERM GOAL #2   Title Pt will increase gait speed to 3.04 ft/sec in order to indicate improved efficiency of gait and decrease fall risk. (Target Date: 02/09/15)   PT SHORT TERM GOAL #3   Title Pt will increase BERG balance score by 4 points to indicate functional improvement in balance and decreased fall risk.  (Target Date: 02/09/15)   PT SHORT TERM GOAL #4   Title Pt will  ambulate 100' w/ SBQC in household simulated environment at S level to indicate improvement in functional mobility and decreasing reliance on AD.  (Target Date: 02/09/15)           PT Long Term Goals - 01/12/15 1940    PT LONG TERM GOAL #1   Title Pt will be independent with HEP for BLE strengthening and balance to indicate improved functional mobility and decreased fall risk. (Target Date: 03/02/15)   PT LONG TERM GOAL #2   Title Pt will increase BERG score to 45/56 in order to indicate functional improvement in balance and decreased fall risk. (Target Date: 03/02/15)   PT LONG TERM GOAL #3   Title Pt will negotiate on paved outdoor surfaces with SBQC at mod I level in order to indicate improved functional mobility and increased independence. (Target Date: 03/02/15)   PT LONG TERM GOAL #4   Title Pt will report 3 ways to conserve energy with ADL's at home and in community.  (Target Date: 03/02/15)               Plan - 01/17/15 1026    Clinical Impression Statement Skilled session focused on introduction of HEP consisting of BLE strengthening with supine and standing therex, see pt instruction.  Also addressed gait with use of tripod cane (as she has at home) in clinic.  Still feel she requires S, but encouraged her to perform at home when mother can provide S for her.  Pt progressing towards goals, however had to modify visits as she only has 4 remaining until end of year.  Pt aware.    Pt will benefit from skilled therapeutic intervention in order to improve on the following deficits Abnormal gait;Decreased activity tolerance;Decreased balance;Decreased endurance;Decreased mobility;Decreased strength;Impaired perceived functional ability;Impaired sensation;Improper body mechanics;Postural dysfunction   Rehab Potential Good   Clinical Impairments Affecting Rehab Potential progressive disease   PT Frequency Other (comment)  once every other wk, 4 total, including eval   PT Duration  Other (comment)  once every other week, 4 visits total (including eval)   PT Treatment/Interventions ADLs/Self Care Home Management;DME Instruction;Gait training;Stair training;Functional mobility training;Therapeutic activities;Therapeutic exercise;Balance training;Neuromuscular re-education;Patient/family education;Energy conservation   PT Next Visit Plan Continue with LLE strengthening, esp hip/knee flex activities, can add balance, gait with tripod cane (no quad cane but black one with 4 points)   Consulted and Agree with Plan of Care Patient        Problem List Patient Active Problem List   Diagnosis Date Noted  . Ataxic gait 12/24/2014  . Left carpal tunnel syndrome 09/27/2014  . Encounter for chemotherapy management 07/06/2014  .  High risk medication use 05/14/2014  . Urinary incontinence 05/14/2014  . Depression with anxiety 05/06/2014  . Lower extremity weakness 04/28/2014  . Numbness of both lower extremities 04/28/2014  . Multiple falls 04/28/2014  . Nausea & vomiting 04/28/2014  . Memory loss 12/03/2011  . Multiple sclerosis ([place]) 09/14/2008  . Constipation 09/14/2008  . RUQ PAIN 09/14/2008    Cameron Sprang, PT, MPT [facility] [address] [place], [place], [address] Phone: [phone]   Fax:  [phone] 01/17/2015, 10:36 AM  Name: Ann Oconnor MRN: [id number] Date of Birth: 01/21/1976

## 2015-01-17 NOTE — Patient Instructions (Addendum)
Knee to Chest    Pull right knee to chest until a stretch is felt in the lower back and buttock.  Hold __60__ seconds each side. Repeat __2__ times. Do __2__ sessions per day.  Bracing With Bridging (Hook-Lying)    With neutral spine, tighten pelvic floor and abdominals and hold. Lift bottom. Repeat __10_ times. Do _2__ times a day.   Copyright  VHI. All rights reserved.    "I love a Database administrator    Stand beside a counter top to hold onto for support, march in place raising your legs as high as you can. Make sure you relax your shoulders and stand tall! Repeat _10__ times each leg. Do _2___ sessions per day.  http://[email or web address].exer.us/345   Copyright  VHI. All rights reserved.   ABDUCTION: Standing (Active)    Stand, feet flat. Lift right leg out to side.  (Again, face counter top for support as needed).  Stand tall and make sure that foot stays pointed towards counter.  Complete _1__ sets of _10__ repetitions. Perform _2__ sessions per day.    http://gtsc.exer.us/111   Copyright  VHI. All rights reserved.   Functional Quadriceps: Sit to Stand    Sit on edge of chair (make sure that backs of legs do not hit couch/chair on the way up), feet flat on floor. Stand upright, extending knees fully (squeeze butt when you are all the way in standing). Repeat __10__ times per set. Do __1__ sets per session. Do __2__ sessions per day.  http://orth.exer.us/735   Copyright  VHI. All rights reserved.    Shoulder Circle Shrug    Bring shoulders up and rotate around backward. Repeat __10__ times. Do _2___ sessions per day.  http://[email or web address].exer.us/15   Copyright  VHI. All rights reserved.   Scapular Retraction: Bilateral    Facing anchor, pull arms back, bringing shoulder blades together. Do this exercise standing with band (knot) closed in doorway.   Repeat _10___ times per set. Do _1___ sets per session. Do _2___ sessions per day.  http://orth.exer.us/177    Copyright  VHI. All rights reserved.

## 2015-01-19 ENCOUNTER — Ambulatory Visit (INDEPENDENT_AMBULATORY_CARE_PROVIDER_SITE_OTHER): Payer: BLUE CROSS/BLUE SHIELD | Admitting: Women's Health

## 2015-01-19 ENCOUNTER — Ambulatory Visit (INDEPENDENT_AMBULATORY_CARE_PROVIDER_SITE_OTHER): Payer: BLUE CROSS/BLUE SHIELD

## 2015-01-19 ENCOUNTER — Encounter: Payer: Self-pay | Admitting: Women's Health

## 2015-01-19 ENCOUNTER — Other Ambulatory Visit (HOSPITAL_COMMUNITY)
Admission: RE | Admit: 2015-01-19 | Discharge: 2015-01-19 | Disposition: A | Discharge status: Home / Self Care | Payer: BLUE CROSS/BLUE SHIELD | Source: Ambulatory Visit | Attending: Women's Health | Admitting: Women's Health

## 2015-01-19 VITALS — BP 124/80 | Ht 64.0 in | Wt 217.0 lb

## 2015-01-19 DIAGNOSIS — R200 Anesthesia of skin: Secondary | ICD-10-CM

## 2015-01-19 DIAGNOSIS — R208 Other disturbances of skin sensation: Secondary | ICD-10-CM | POA: Diagnosis not present

## 2015-01-19 DIAGNOSIS — R260 Ataxic gait: Secondary | ICD-10-CM

## 2015-01-19 DIAGNOSIS — N912 Amenorrhea, unspecified: Secondary | ICD-10-CM | POA: Diagnosis not present

## 2015-01-19 DIAGNOSIS — R87810 Cervical high risk human papillomavirus (HPV) DNA test positive: Secondary | ICD-10-CM | POA: Insufficient documentation

## 2015-01-19 DIAGNOSIS — G35 Multiple sclerosis: Secondary | ICD-10-CM | POA: Diagnosis not present

## 2015-01-19 DIAGNOSIS — Z01411 Encounter for gynecological examination (general) (routine) with abnormal findings: Secondary | ICD-10-CM | POA: Diagnosis present

## 2015-01-19 DIAGNOSIS — Z01419 Encounter for gynecological examination (general) (routine) without abnormal findings: Secondary | ICD-10-CM | POA: Diagnosis not present

## 2015-01-19 DIAGNOSIS — Z1151 Encounter for screening for human papillomavirus (HPV): Secondary | ICD-10-CM | POA: Diagnosis not present

## 2015-01-19 MED ORDER — GADOPENTETATE DIMEGLUMINE 469.01 MG/ML IV SOLN: 20.0000 mL | Freq: Once | INTRAVENOUS | Status: DC | PRN

## 2015-01-19 NOTE — Patient Instructions (Signed)
Menopause and Herbal Products WHAT IS MENOPAUSE? Menopause is the normal time of life when menstrual periods decrease in frequency and eventually stop completely. This process can take several years for some women. Menopause is complete when you have had an absence of menstruation for a full year since your last menstrual period. It usually occurs between the ages of 18 and 41. It is not common for menopause to begin before the age of 38. During menopause, your body stops producing the female hormones estrogen and progesterone. Common symptoms associated with this loss of hormones (vasomotor symptoms) are:  Hot flashes.  Hot flushes.  Night sweats. Other common symptoms and complications of menopause include:  Decrease in sex drive.  Vaginal dryness and thinning of the walls of the vagina. This can make sex painful.  Dryness of the skin and development of wrinkles.  Headaches.  Tiredness.  Irritability.  Memory problems.  Weight gain.  Bladder infections.  Hair growth on the face and chest.  Inability to reproduce offspring (infertility).  Loss of density in the bones (osteoporosis) increasing your risk for breaks (fractures).  Depression.  Hardening and narrowing of the arteries (atherosclerosis). This increases your risk of heart attack and stroke. WHAT TREATMENT OPTIONS ARE AVAILABLE? There are many treatment choices for menopause symptoms. The most common treatment is hormone replacement therapy. Many alternative therapies for menopause are emerging, including the use of herbal products. These supplements can be found in the form of herbs, teas, oils, tinctures, and pills. Common herbal supplements for menopause are made from plants that contain phytoestrogens. Phytoestrogens are compounds that occur naturally in plants and plant products. They act like estrogen in the body. Foods and herbs that contain phytoestrogens include:  Soy.  Flax seeds.  Red  clover.  Ginseng. WHAT MENOPAUSE SYMPTOMS MAY BE HELPED IF I USE HERBAL PRODUCTS?  Vasomotor symptoms. These may be helped by:  Soy. Some studies show that soy may have a moderate benefit for hot flashes.  Black cohosh. There is limited evidence indicating this may be beneficial for hot flashes.  Symptoms that are related to heart and blood vessel disease. These may be helped by soy. Studies have shown that soy can help to lower cholesterol.  Depression. This may be helped by:  St. John's wort. There is limited evidence that shows this may help mild to moderate depression.  Black cohosh. There is evidence that this may help depression and mood swings.  Osteoporosis. Soy may help to decrease bone loss that is associated with menopause and may prevent osteoporosis. Limited evidence indicates that red clover may offer some bone loss protection as well. Other herbal products that are commonly used during menopause lack enough evidence to support their use as a replacement for conventional menopause therapies. These products include evening primrose, ginseng, and red clover. WHAT ARE THE CASES WHEN HERBAL PRODUCTS SHOULD NOT BE USED DURING MENOPAUSE? Do not use herbal products during menopause without your health care provider's approval if:  You are taking medicine.  You have a preexisting liver condition. ARE THERE ANY RISKS IN MY TAKING HERBAL PRODUCTS DURING MENOPAUSE? If you choose to use herbal products to help with symptoms of menopause, keep in mind that:  Different supplements have different and unmeasured amounts of herbal ingredients.  Herbal products are not regulated the same way that medicines are.  Concentrations of herbs may vary depending on the way they are prepared. For example, the concentration may be different in a pill, tea, oil, and tincture.  Little is known about the risks of using herbal products, particularly the risks of long-term use.  Some herbal  supplements can be harmful when combined with certain medicines. Most commonly reported side effects of herbal products are mild. However, if used improperly, many herbal supplements can cause serious problems. Talk to your health care provider before starting any herbal product. If problems develop, stop taking the supplement and let your health care provider know.   This information is not intended to replace advice given to you by your health care provider. Make sure you discuss any questions you have with your health care provider.   Document Released: 08/08/2007 Document Revised: 03/12/2014 Document Reviewed: 08/04/2013 Elsevier Interactive Patient Education 2016 [place] Maintenance, Female Adopting a healthy lifestyle and getting preventive care can go a long way to promote health and wellness. Talk with your health care provider about what schedule of regular examinations is right for you. This is a good chance for you to check in with your provider about disease prevention and staying healthy. In between checkups, there are plenty of things you can do on your own. Experts have done a lot of research about which lifestyle changes and preventive measures are most likely to keep you healthy. Ask your health care provider for more information. WEIGHT AND DIET  Eat a healthy diet  Be sure to include plenty of vegetables, fruits, low-fat dairy products, and lean protein.  Do not eat a lot of foods high in solid fats, added sugars, or salt.  Get regular exercise. This is one of the most important things you can do for your health.  Most adults should exercise for at least 150 minutes each week. The exercise should increase your heart rate and make you sweat (moderate-intensity exercise).  Most adults should also do strengthening exercises at least twice a week. This is in addition to the moderate-intensity exercise.  Maintain a healthy weight  Body mass index (BMI) is a  measurement that can be used to identify possible weight problems. It estimates body fat based on height and weight. Your health care provider can help determine your BMI and help you achieve or maintain a healthy weight.  For females 72 years of age and older:   A BMI below 18.5 is considered underweight.  A BMI of 18.5 to 24.9 is normal.  A BMI of 25 to 29.9 is considered overweight.  A BMI of 30 and above is considered obese.  Watch levels of cholesterol and blood lipids  You should start having your blood tested for lipids and cholesterol at 40 years of age, then have this test every 5 years.  You may need to have your cholesterol levels checked more often if:  Your lipid or cholesterol levels are high.  You are older than 40 years of age.  You are at high risk for heart disease.  CANCER SCREENING   Lung Cancer  Lung cancer screening is recommended for adults 59-54 years old who are at high risk for lung cancer because of a history of smoking.  A yearly low-dose CT scan of the lungs is recommended for people who:  Currently smoke.  Have quit within the past 15 years.  Have at least a 30-pack-year history of smoking. A pack year is smoking an average of one pack of cigarettes a day for 1 year.  Yearly screening should continue until it has been 15 years since you quit.  Yearly screening should stop if you develop a  health problem that would prevent you from having lung cancer treatment.  Breast Cancer  Practice breast self-awareness. This means understanding how your breasts normally appear and feel.  It also means doing regular breast self-exams. Let your health care provider know about any changes, no matter how small.  If you are in your 20s or 30s, you should have a clinical breast exam (CBE) by a health care provider every 1-3 years as part of a regular health exam.  If you are 48 or older, have a CBE every year. Also consider having a breast X-ray  (mammogram) every year.  If you have a family history of breast cancer, talk to your health care provider about genetic screening.  If you are at high risk for breast cancer, talk to your health care provider about having an MRI and a mammogram every year.  Breast cancer gene (BRCA) assessment is recommended for women who have family members with BRCA-related cancers. BRCA-related cancers include:  Breast.  Ovarian.  Tubal.  Peritoneal cancers.  Results of the assessment will determine the need for genetic counseling and BRCA1 and BRCA2 testing. Cervical Cancer Your health care provider may recommend that you be screened regularly for cancer of the pelvic organs (ovaries, uterus, and vagina). This screening involves a pelvic examination, including checking for microscopic changes to the surface of your cervix (Pap test). You may be encouraged to have this screening done every 3 years, beginning at age 70.  For women ages 63-65, health care providers may recommend pelvic exams and Pap testing every 3 years, or they may recommend the Pap and pelvic exam, combined with testing for human papilloma virus (HPV), every 5 years. Some types of HPV increase your risk of cervical cancer. Testing for HPV may also be done on women of any age with unclear Pap test results.  Other health care providers may not recommend any screening for nonpregnant women who are considered low risk for pelvic cancer and who do not have symptoms. Ask your health care provider if a screening pelvic exam is right for you.  If you have had past treatment for cervical cancer or a condition that could lead to cancer, you need Pap tests and screening for cancer for at least 20 years after your treatment. If Pap tests have been discontinued, your risk factors (such as having a new sexual partner) need to be reassessed to determine if screening should resume. Some women have medical problems that increase the chance of getting  cervical cancer. In these cases, your health care provider may recommend more frequent screening and Pap tests. Colorectal Cancer  This type of cancer can be detected and often prevented.  Routine colorectal cancer screening usually begins at 41 years of age and continues through 40 years of age.  Your health care provider may recommend screening at an earlier age if you have risk factors for colon cancer.  Your health care provider may also recommend using home test kits to check for hidden blood in the stool.  A small camera at the end of a tube can be used to examine your colon directly (sigmoidoscopy or colonoscopy). This is done to check for the earliest forms of colorectal cancer.  Routine screening usually begins at age 75.  Direct examination of the colon should be repeated every 5-10 years through 40 years of age. However, you may need to be screened more often if early forms of precancerous polyps or small growths are found. Skin Cancer  Check  your skin from head to toe regularly.  Tell your health care provider about any new moles or changes in moles, especially if there is a change in a mole's shape or color.  Also tell your health care provider if you have a mole that is larger than the size of a pencil eraser.  Always use sunscreen. Apply sunscreen liberally and repeatedly throughout the day.  Protect yourself by wearing long sleeves, pants, a wide-brimmed hat, and sunglasses whenever you are outside. HEART DISEASE, DIABETES, AND HIGH BLOOD PRESSURE   High blood pressure causes heart disease and increases the risk of stroke. High blood pressure is more likely to develop in:  People who have blood pressure in the high end of the normal range (130-139/85-89 mm Hg).  People who are overweight or obese.  People who are African American.  If you are 54-34 years of age, have your blood pressure checked every 3-5 years. If you are 29 years of age or older, have your blood  pressure checked every year. You should have your blood pressure measured twice--once when you are at a hospital or clinic, and once when you are not at a hospital or clinic. Record the average of the two measurements. To check your blood pressure when you are not at a hospital or clinic, you can use:  An automated blood pressure machine at a pharmacy.  A home blood pressure monitor.  If you are between 4 years and 19 years old, ask your health care provider if you should take aspirin to prevent strokes.  Have regular diabetes screenings. This involves taking a blood sample to check your fasting blood sugar level.  If you are at a normal weight and have a low risk for diabetes, have this test once every three years after 40 years of age.  If you are overweight and have a high risk for diabetes, consider being tested at a younger age or more often. PREVENTING INFECTION  Hepatitis B  If you have a higher risk for hepatitis B, you should be screened for this virus. You are considered at high risk for hepatitis B if:  You were born in a country where hepatitis B is common. Ask your health care provider which countries are considered high risk.  Your parents were born in a high-risk country, and you have not been immunized against hepatitis B (hepatitis B vaccine).  You have HIV or AIDS.  You use needles to inject street drugs.  You live with someone who has hepatitis B.  You have had sex with someone who has hepatitis B.  You get hemodialysis treatment.  You take certain medicines for conditions, including cancer, organ transplantation, and autoimmune conditions. Hepatitis C  Blood testing is recommended for:  Everyone born from [birth year] through 1966.  Anyone with known risk factors for hepatitis C. Sexually transmitted infections (STIs)  You should be screened for sexually transmitted infections (STIs) including gonorrhea and chlamydia if:  You are sexually active and are  younger than 40 years of age.  You are older than 40 years of age and your health care provider tells you that you are at risk for this type of infection.  Your sexual activity has changed since you were last screened and you are at an increased risk for chlamydia or gonorrhea. Ask your health care provider if you are at risk.  If you do not have HIV, but are at risk, it may be recommended that you take a prescription medicine daily  to prevent HIV infection. This is called pre-exposure prophylaxis (PrEP). You are considered at risk if:  You are sexually active and do not regularly use condoms or know the HIV status of your partner(s).  You take drugs by injection.  You are sexually active with a partner who has HIV. Talk with your health care provider about whether you are at high risk of being infected with HIV. If you choose to begin PrEP, you should first be tested for HIV. You should then be tested every 3 months for as long as you are taking PrEP.  PREGNANCY   If you are premenopausal and you may become pregnant, ask your health care provider about preconception counseling.  If you may become pregnant, take 400 to 800 micrograms (mcg) of folic acid every day.  If you want to prevent pregnancy, talk to your health care provider about birth control (contraception). OSTEOPOROSIS AND MENOPAUSE   Osteoporosis is a disease in which the bones lose minerals and strength with aging. This can result in serious bone fractures. Your risk for osteoporosis can be identified using a bone density scan.  If you are 20 years of age or older, or if you are at risk for osteoporosis and fractures, ask your health care provider if you should be screened.  Ask your health care provider whether you should take a calcium or vitamin D supplement to lower your risk for osteoporosis.  Menopause may have certain physical symptoms and risks.  Hormone replacement therapy may reduce some of these symptoms and  risks. Talk to your health care provider about whether hormone replacement therapy is right for you.  HOME CARE INSTRUCTIONS   Schedule regular health, dental, and eye exams.  Stay current with your immunizations.   Do not use any tobacco products including cigarettes, chewing tobacco, or electronic cigarettes.  If you are pregnant, do not drink alcohol.  If you are breastfeeding, limit how much and how often you drink alcohol.  Limit alcohol intake to no more than 1 drink per day for nonpregnant women. One drink equals 12 ounces of beer, 5 ounces of wine, or 1 ounces of hard liquor.  Do not use street drugs.  Do not share needles.  Ask your health care provider for help if you need support or information about quitting drugs.  Tell your health care provider if you often feel depressed.  Tell your health care provider if you have ever been abused or do not feel safe at home.   This information is not intended to replace advice given to you by your health care provider. Make sure you discuss any questions you have with your health care provider.   Document Released: 09/04/2010 Document Revised: 03/12/2014 Document Reviewed: 01/21/2013 Elsevier Interactive Patient Education [employer].

## 2015-01-19 NOTE — Progress Notes (Signed)
[place] 09-03-1975 [id number]    History:    Presents for annual exam.  Amenorrhea since May. Started on chemotherapy in April for MS treatment, numerous hot flushes. Not sexually active. Had a monthly cycle prior. 2014 Pap LGSIL positive high risk negative colposcopy and biopsy. Has not had a baseline mammogram. Neurologist manages MS has difficulty with gait/ uses a walker and with short-term memory, on disability. Denies urinary incontinence, visual changes with fatigue.  Past medical history, past surgical history, family history and social history were all reviewed and documented in the EPIC chart.   ROS:  A ROS was performed and pertinent positives and negatives are included.  Exam:  Filed Vitals:   01/19/15 0856  BP: 124/80    General appearance:  Normal Thyroid:  Symmetrical, normal in size, without palpable masses or nodularity. Respiratory  Auscultation:  Clear without wheezing or rhonchi Cardiovascular  Auscultation:  Regular rate, without rubs, murmurs or gallops  Edema/varicosities:  Not grossly evident Abdominal  Soft,nontender, without masses, guarding or rebound.  Liver/spleen:  No organomegaly noted  Hernia:  None appreciated  Skin  Inspection:  Grossly normal   Breasts: Examined lying and sitting.     Right: Without masses, retractions, discharge or axillary adenopathy.     Left: Without masses, retractions, discharge or axillary adenopathy. Gentitourinary   Inguinal/mons:  Normal without inguinal adenopathy  External genitalia:  Normal  BUS/Urethra/Skene's glands:  Normal  Vagina:  Normal  Cervix:  Normal  Uterus:   normal in size, shape and contour.  Midline and mobile  Adnexa/parametria:     Rt: Without masses or tenderness.   Lt: Without masses or tenderness.  Anus and perineum: Normal  Digital rectal exam: Normal sphincter tone without palpated masses or tenderness  Assessment/Plan:  39 y.o. S WF G0 for annual exam.   Amenorrheic since  chemotherapy started in treatment for MS 06/2014 MS- neurologist manages labs and medications 2014 LGSIL with positive HR HPV with a negative biopsy Obese  Plan: SBE's, reviewed importance of annual screening mammogram, breast center information given and reviewed. Instructed to schedule. Continue regular exercise, decrease calories for weight loss encouraged. [place], reports hot flushes, good sleep. UA, Pap with HR HPV typing, new screening guidelines reviewed.  Huel Cote WHNP, 10:09 AM 01/19/2015

## 2015-01-20 LAB — FOLLICLE STIMULATING HORMONE: FSH: 70.9 m[IU]/mL

## 2015-01-20 LAB — CYTOLOGY - PAP

## 2015-01-24 NOTE — Telephone Encounter (Signed)
I have spoken with Syla this morning and per RAS, advised that no new lesions were noted on recent mris of brain and c-spine.  She verbalized understanding of same/fim

## 2015-01-26 ENCOUNTER — Ambulatory Visit: Payer: BLUE CROSS/BLUE SHIELD | Admitting: Physical Therapy

## 2015-02-01 ENCOUNTER — Ambulatory Visit: Payer: BLUE CROSS/BLUE SHIELD | Admitting: Physical Therapy

## 2015-02-01 ENCOUNTER — Ambulatory Visit: Payer: BLUE CROSS/BLUE SHIELD | Admitting: Rehabilitation

## 2015-02-07 ENCOUNTER — Other Ambulatory Visit: Payer: Self-pay

## 2015-02-07 ENCOUNTER — Ambulatory Visit (HOSPITAL_COMMUNITY): Payer: BLUE CROSS/BLUE SHIELD | Attending: Cardiology

## 2015-02-07 DIAGNOSIS — Z79899 Other long term (current) drug therapy: Secondary | ICD-10-CM | POA: Diagnosis not present

## 2015-02-07 DIAGNOSIS — F329 Major depressive disorder, single episode, unspecified: Secondary | ICD-10-CM | POA: Insufficient documentation

## 2015-02-07 DIAGNOSIS — G35 Multiple sclerosis: Secondary | ICD-10-CM | POA: Diagnosis not present

## 2015-02-07 DIAGNOSIS — I340 Nonrheumatic mitral (valve) insufficiency: Secondary | ICD-10-CM | POA: Insufficient documentation

## 2015-02-07 DIAGNOSIS — F419 Anxiety disorder, unspecified: Secondary | ICD-10-CM | POA: Diagnosis not present

## 2015-02-07 DIAGNOSIS — I371 Nonrheumatic pulmonary valve insufficiency: Secondary | ICD-10-CM | POA: Insufficient documentation

## 2015-02-08 ENCOUNTER — Telehealth: Payer: Self-pay | Admitting: *Deleted

## 2015-02-08 ENCOUNTER — Encounter: Payer: Self-pay | Admitting: Physical Therapy

## 2015-02-08 ENCOUNTER — Ambulatory Visit: Payer: BLUE CROSS/BLUE SHIELD | Attending: Neurology | Admitting: Physical Therapy

## 2015-02-08 DIAGNOSIS — R269 Unspecified abnormalities of gait and mobility: Secondary | ICD-10-CM | POA: Diagnosis present

## 2015-02-08 DIAGNOSIS — R29898 Other symptoms and signs involving the musculoskeletal system: Secondary | ICD-10-CM | POA: Insufficient documentation

## 2015-02-08 DIAGNOSIS — G35 Multiple sclerosis: Secondary | ICD-10-CM | POA: Diagnosis present

## 2015-02-08 DIAGNOSIS — R2681 Unsteadiness on feet: Secondary | ICD-10-CM | POA: Diagnosis present

## 2015-02-08 NOTE — Telephone Encounter (Signed)
I have spoken with Ann Oconnor this afternoon and advised ok for next Novantrone infusion.  She sts. this is sched. for 02-23-15 at [facility] short stay.  She will call me if she needs anything/fim

## 2015-02-08 NOTE — Therapy (Signed)
[facility] [address] [place], [place], [address] Phone: [phone]   Fax:  [phone]  Physical Therapy Treatment  Patient Details  Name: Ann Oconnor MRN: [id number] Date of Birth: 01/22/1976 Referring Provider: Arlice Colt, MD  Encounter Date: 02/08/2015      PT End of Session - 02/08/15 1024    Visit Number 3   Number of Visits 4  eval +3 visits, per insurance    Date for PT Re-Evaluation 03/04/15   Authorization Type BC[employer] (only has 4 visits total until end of year)   Authorization - Visit Number 2   Authorization - Number of Visits 4   PT Start Time 0930   PT Stop Time 1015   PT Time Calculation (min) 45 min   Equipment Utilized During Treatment Gait belt   Activity Tolerance Patient tolerated treatment well   Behavior During Therapy [facility] for tasks assessed/performed      Past Medical History  Diagnosis Date  . MS (multiple sclerosis) ([place]) 2002  . LGSIL (low grade squamous intraepithelial dysplasia)   . Movement disorder   . Vision abnormalities   . Family history of adverse reaction to anesthesia     Father - difficulty awaken- has sleep apena  . Seizures ([place]) 2003  . Head injury, closed, with brief LOC ([place]) 2003    fall  . Anxiety   . UTI (lower urinary tract infection)   . Gait instability   . Incontinence     Past Surgical History  Procedure Laterality Date  . Cervical cone biopsy  2008    CIN 2  . Portacath placement Left 08/18/14  . Portacath placement Left 08/18/2014    Procedure: LEFT SUBCLAVIAN VEIN PORT PLACEMENT;  Surgeon: Donnie Mesa, MD;  Location: [facility];  Service: General;  Laterality: Left;    There were no vitals filed for this visit.  Visit Diagnosis:  Unsteadiness  Abnormality of gait  Left leg weakness  Multiple sclerosis ([facility])      Subjective Assessment - 02/08/15 1018    Subjective Pt reports walking out in the community and strengthening LE's at  home. She reports trying to use the cane around the house. Pt reports walking with rollator in gravel driveway without rest breaks. Now she says that she is going to have to build confidence to start walking more with the cane. Pt reports on some days that it is extremely difficult to roll to R side especially if there is no rail for UE assist.   Pertinent History MS, anxiety    Limitations Walking;House hold activities;Lifting;Sitting   How long can you sit comfortably? gets stiff after she has been sitting for long periods of time   Patient Stated Goals "I want to walk without the walker."      Gait: To increase pt's activity tolerance with 4 point rubber tip cane.   1. Pt completed 158f X1 and 2369fX1 with 4 point rubber tip cane with min guard assist due to instability and intermittent LOB. SPTA gave rationale for cane in correct hand. Pt reported her R LE the weakest, so pt held it in Left hand.  NMR: To maximze stability with gait while ambulating in the home and out in the community, and to update HEP for challenging workload at home. Corner balance:  A. 66m38mon double pillow in narrow stance with horizontal, vertical and diagonal head turns. Pt supervision assist with intermittent UE support on chair and walls.  B. 36mn on double pillow in narrow stance with eyes closed. Pt supervision assist with intermittent UE support on chair and walls. Pt had to place feet 2in apart to make challenge appropriate.  Therapeutic Activities: To increase pt's functional independence with rolling bed mobility.  1. Pt completed 5 reps of rolling to R side using flexed knees and momentum from L UE. Pt supervision assist with verbal cues for proper UE and LE use for efficiency and effectiveness.      [facility]/Exercise - 02/09/15 0001    Ambulation/Gait   Gait velocity 1.62 ft.sec  with rubber quad tip cane           PT Education - 02/08/15 1021    Education provided Yes    Education Details Pt was educated to set weekly goals for HEP especially for timing ambulation activities to assess progress in activity tolerance. Pt also educated on benefits of static and dynamic balance activities to in order to increase stability with gait and decrease risk of falls. Pt educated on proper rolling technique for independent bed mobility. Added corner balance and counter top balance activities to HEP.   Person(s) Educated Patient   Methods Explanation, Handout, Demonstration   Comprehension Verbalized understanding         PT Short Term Goals - 02/08/15 1026    PT SHORT TERM GOAL #1   Title Pt will initiate HEP for BLE strengthening and balance to decrease fall risk and improve functional mobility.  (Target Date: 02/09/15)   Baseline Achieved: 02/08/15   Status Achieved   PT SHORT TERM GOAL #2   Title Pt will increase gait speed to 3.04 ft/sec in order to indicate improved efficiency of gait and decrease fall risk. (Target Date: 02/09/15)   Baseline Assessed: 02/08/15. Pt scored a 1.693fsec gait speed with point rubber tip cane. Gait speed with RW was not assessed.   Status Not Met   PT SHORT TERM GOAL #3   Title Pt will increase BERG balance score by 4 points to indicate functional improvement in balance and decreased fall risk.  (Target Date: 02/09/15)   Status On-going   PT SHORT TERM GOAL #4   Title Pt will ambulate 100' w/ SBQC in household simulated environment at S level to indicate improvement in functional mobility and decreasing reliance on AD.  (Target Date: 02/09/15)   Baseline 02/08/15: pt need's up to min assist at times for gait with cane using rubber quad tip   Status Not Met            PT Long Term Goals - 02/08/15 1028    PT LONG TERM GOAL #1   Title Pt will be independent with HEP for BLE strengthening and balance to indicate improved functional mobility and decreased fall risk. (Target Date: 03/02/15)   PT LONG TERM GOAL #2   Title Pt will increase  BERG score to 45/56 in order to indicate functional improvement in balance and decreased fall risk. (Target Date: 03/02/15)   PT LONG TERM GOAL #3   Title Pt will negotiate on paved outdoor surfaces with SBQC at mod I level in order to indicate improved functional mobility and increased independence. (Target Date: 03/02/15)   PT LONG TERM GOAL #4   Title Pt will report 3 ways to conserve energy with ADL's at home and in community.  (Target Date: 03/02/15)            Plan - 02/08/15 1029    Clinical  Impression Statement Skilled treatment session focused on increasing activity tolerance with the limited support of a 4 point rubber tip cane and updated HEP with new static and dynamic balance activities. Pt able to completed 265f of gait with min guard assist. Pt has achieved STG1 and is progressing towards other goals. Pt's gait speed at 1.653fsec with 4 point rubber tip cane. Pt verbalized understanding of the necessity for balance training.r    Pt will benefit from skilled therapeutic intervention in order to improve on the following deficits Abnormal gait;Decreased activity tolerance;Decreased balance;Decreased endurance;Decreased mobility;Decreased strength;Impaired perceived functional ability;Impaired sensation;Improper body mechanics;Postural dysfunction   Rehab Potential Good   Clinical Impairments Affecting Rehab Potential progressive disease   PT Frequency Other (comment)  once every other wk, 4 total, including eval   PT Duration Other (comment)  once every other week, 4 visits total (including eval)   PT Treatment/Interventions ADLs/Self Care Home Management;DME Instruction;Gait training;Stair training;Functional mobility training;Therapeutic activities;Therapeutic exercise;Balance training;Neuromuscular re-education;Patient/family education;Energy conservation   PT Next Visit Plan Assess remaining STG's. Continue increasing pt's activity tolerance with straight cane with 4 point  rubber tip. Continue challenging static and dynamic balance to increase stability with gait.   Consulted and Agree with Plan of Care Patient        Problem List Patient Active Problem List   Diagnosis Date Noted  . Ataxic gait 12/24/2014  . Left carpal tunnel syndrome 09/27/2014  . Encounter for chemotherapy management 07/06/2014  . High risk medication use 05/14/2014  . Urinary incontinence 05/14/2014  . Depression with anxiety 05/06/2014  . Lower extremity weakness 04/28/2014  . Numbness of both lower extremities 04/28/2014  . Multiple falls 04/28/2014  . Nausea & vomiting 04/28/2014  . Memory loss 12/03/2011  . Multiple sclerosis ([place]/13/2010  . Constipation 09/14/2008  . RUQ PAIN 09/14/2008    DeLaney Potash2/08/2014, 10:38 AM  DeLaney PotashSPTA  Name: Ann Oconnor: [id number] of Birth: 2/05-15-1977This note has been reviewed and edited by supervising CI.  This entire session was performed under direct supervision and direction of a licensed therapist/therapist assistant . I have personally read, edited and approve of the note as written.

## 2015-02-08 NOTE — Patient Instructions (Signed)
Feet Together (Compliant Surface) Head Motion - Eyes Closed    In corner with chair in front of you for safety: Stand on compliant surface: __pillow__ with feet together. Close eyes and move head: 1. slowly, up and down 2. Slowly, move head left<> right 3. Slowly, move head in diagonal directions, both ways Repeat _10___ times per session. Do _1-2___ sessions per day.  Copyright  VHI. All rights reserved.  Feet Together (Compliant Surface) Varied Arm Positions - Eyes Closed    In corner with chair in front of you for safety: Stand on compliant surface: _pillows_ with feet together and arms out. Close eyes and visualize upright position. Hold_45_ seconds. Repeat __3-5_ times per session. Do ____ sessions per day.  Copyright  VHI. All rights reserved.  Side-Stepping    At counter for UE support: Walk to left side with eyes open. Take even steps, leading with same foot. Make sure each foot lifts off the floor. Repeat in opposite direction. Repeat for 3 laps each way. Do __1-2__ sessions per day.   Copyright  VHI. All rights reserved.   At counter top: Walk forward with light UE support of one hand on counter (to simulate using cane). Repeat for 4 laps forward.

## 2015-02-08 NOTE — Telephone Encounter (Signed)
Form on Faith desk.  

## 2015-02-08 NOTE — Telephone Encounter (Signed)
-----   Message from Britt Bottom, MD sent at 02/07/2015  5:22 PM EST ----- Okay to schedule her next Novantrone dose 3 months from her last one.

## 2015-02-15 ENCOUNTER — Ambulatory Visit: Payer: BLUE CROSS/BLUE SHIELD | Admitting: Rehabilitation

## 2015-02-21 NOTE — Progress Notes (Signed)
Spoke w Pharmacist- Romeo Rabon- we need CBC w Diff , Cmet, and Urine preg within a month. Labs in computer are 2 months out. Call to office

## 2015-02-21 NOTE — Telephone Encounter (Signed)
Dee[facility] [phone] called regarding needing recent labs or order for labs and to correct date on order for Novantrone Infusion, date on order is 11/17/14 needs to be 02/16/15.

## 2015-02-22 ENCOUNTER — Ambulatory Visit: Payer: BLUE CROSS/BLUE SHIELD | Admitting: Physical Therapy

## 2015-02-22 ENCOUNTER — Other Ambulatory Visit (INDEPENDENT_AMBULATORY_CARE_PROVIDER_SITE_OTHER): Payer: Self-pay

## 2015-02-22 ENCOUNTER — Other Ambulatory Visit: Payer: Self-pay | Admitting: *Deleted

## 2015-02-22 DIAGNOSIS — G35 Multiple sclerosis: Secondary | ICD-10-CM

## 2015-02-22 DIAGNOSIS — Z0289 Encounter for other administrative examinations: Secondary | ICD-10-CM

## 2015-02-22 NOTE — Telephone Encounter (Signed)
Dee[facility] [phone] called to follow up on message left yesterday regarding needing recent labs or order for labs and to correct date on order for Novantrone Infusion, date on order was incorrect, needs to be 02/16/15.

## 2015-02-22 NOTE — Telephone Encounter (Signed)
Updated orders have been faxed and confirmed to [facility].  Lab orders placed in Epic.  Patient is coming today to have them drawn.

## 2015-02-23 ENCOUNTER — Encounter (HOSPITAL_COMMUNITY)
Admission: RE | Admit: 2015-02-23 | Discharge: 2015-02-23 | Disposition: A | Discharge status: Home / Self Care | Payer: BLUE CROSS/BLUE SHIELD | Source: Ambulatory Visit | Attending: Neurology | Admitting: Neurology

## 2015-02-23 ENCOUNTER — Encounter (HOSPITAL_COMMUNITY): Payer: Self-pay

## 2015-02-23 DIAGNOSIS — G35 Multiple sclerosis: Secondary | ICD-10-CM | POA: Diagnosis not present

## 2015-02-23 LAB — CBC WITH DIFFERENTIAL/PLATELET
BASOS: 0 %
Basophils Absolute: 0 10*3/uL (ref 0.0–0.2)
EOS (ABSOLUTE): 0.1 10*3/uL (ref 0.0–0.4)
EOS: 1 %
HEMATOCRIT: 35.2 % (ref 34.0–46.6)
Hemoglobin: 11.5 g/dL (ref 11.1–15.9)
IMMATURE GRANS (ABS): 0 10*3/uL (ref 0.0–0.1)
IMMATURE GRANULOCYTES: 0 %
LYMPHS: 27 %
Lymphocytes Absolute: 1.9 10*3/uL (ref 0.7–3.1)
MCH: 30.9 pg (ref 26.6–33.0)
MCHC: 32.7 g/dL (ref 31.5–35.7)
MCV: 95 fL (ref 79–97)
MONOCYTES: 8 %
Monocytes Absolute: 0.6 10*3/uL (ref 0.1–0.9)
NEUTROS PCT: 64 %
Neutrophils Absolute: 4.6 10*3/uL (ref 1.4–7.0)
Platelets: 349 10*3/uL (ref 150–379)
RBC: 3.72 x10E6/uL — ABNORMAL LOW (ref 3.77–5.28)
RDW: 14.2 % (ref 12.3–15.4)
WBC: 7.2 10*3/uL (ref 3.4–10.8)

## 2015-02-23 LAB — COMPREHENSIVE METABOLIC PANEL
ALT: 13 IU/L (ref 0–32)
AST: 20 IU/L (ref 0–40)
Albumin/Globulin Ratio: 1.8 (ref 1.1–2.5)
Albumin: 4.4 g/dL (ref 3.5–5.5)
Alkaline Phosphatase: 83 IU/L (ref 39–117)
BUN/Creatinine Ratio: 17 (ref 9–23)
BUN: 15 mg/dL (ref 6–24)
Bilirubin Total: 0.2 mg/dL (ref 0.0–1.2)
CALCIUM: 9.2 mg/dL (ref 8.7–10.2)
CO2: 25 mmol/L (ref 18–29)
CREATININE: 0.88 mg/dL (ref 0.57–1.00)
Chloride: 101 mmol/L (ref 96–106)
GFR calc Af Amer: 95 mL/min/{1.73_m2} (ref >59)
GFR, EST NON AFRICAN AMERICAN: 82 mL/min/{1.73_m2} (ref >59)
GLOBULIN, TOTAL: 2.5 g/dL (ref 1.5–4.5)
Glucose: 99 mg/dL (ref 65–99)
Potassium: 4.1 mmol/L (ref 3.5–5.2)
Sodium: 143 mmol/L (ref 134–144)
Total Protein: 6.9 g/dL (ref 6.0–8.5)

## 2015-02-23 MED ORDER — ONDANSETRON HCL 4 MG/2ML IJ SOLN
4.0000 mg | INTRAMUSCULAR | Status: DC
  Administered 2015-02-23: 4 mg via INTRAVENOUS
  Filled 2015-02-23: qty 2

## 2015-02-23 MED ORDER — ONDANSETRON HCL 4 MG/2ML IJ SOLN
4.0000 mg | Freq: Once | INTRAMUSCULAR | Status: AC
  Administered 2015-02-23: 4 mg via INTRAVENOUS
  Filled 2015-02-23: qty 2

## 2015-02-23 MED ORDER — HEPARIN SOD (PORK) LOCK FLUSH 100 UNIT/ML IV SOLN
500.0000 [IU] | INTRAVENOUS | Status: DC | PRN
  Administered 2015-02-23: 500 [IU]
  Filled 2015-02-23: qty 5

## 2015-02-23 MED ORDER — ACETAMINOPHEN 325 MG PO TABS
650.0000 mg | ORAL_TABLET | ORAL | Status: DC
  Administered 2015-02-23: 650 mg via ORAL
  Filled 2015-02-23: qty 2

## 2015-02-23 MED ORDER — SODIUM CHLORIDE 0.9 % IV SOLN
INTRAVENOUS | Status: DC
  Administered 2015-02-23: 250 mL via INTRAVENOUS

## 2015-02-23 MED ORDER — SODIUM CHLORIDE 0.9 % IV SOLN
27.0000 mg | INTRAVENOUS | Status: DC
  Administered 2015-02-23: 28 mg via INTRAVENOUS
  Filled 2015-02-23: qty 14

## 2015-02-23 MED ORDER — SODIUM CHLORIDE 0.9 % IJ SOLN
10.0000 mL | INTRAMUSCULAR | Status: DC | PRN
  Administered 2015-02-23: 10 mL
  Filled 2015-02-23: qty 10

## 2015-02-23 NOTE — Progress Notes (Signed)
Pt arrived today for her Novantone infusion ambulatory with rolling walker accompanied by Mom. Uneventful infusion of Novantrone and pt was discharged ambulatory with rolling walker accompanied by Mom to elevator

## 2015-02-23 NOTE — Discharge Instructions (Signed)
Mitoxantrone injection What is this medicine? MITOXANTRONE (MYE toe ZAN trone) is a chemotherapy drug. It targets fast dividing cells, like cancer cells, and causes these cells to die. This medicine is used to treat acute nonlymphocytic leukemia (ANLL) and advanced prostate cancer. It is also used to treat certain types of multiple sclerosis. This medicine may be used for other purposes; ask your health care provider or pharmacist if you have questions. What should I tell my health care provider before I take this medicine? They need to know if you have any of these conditions: -heart disease -infection (especially virus infection such as chickenpox or herpes) -liver disease -low blood counts, like low platelets, red blood cells, white blood cells -previous chemotherapy, especially with doxorubicin, daunorubicin, epirubicin, or idarubicin -recent or ongoing radiation therapy -an unusual or allergic reaction to mitoxantrone, other medicines, foods, dyes, or preservatives -pregnant or are trying to get pregnant -breast-feeding How should I use this medicine? This drug is given as an infusion into a vein. It is administered in a hospital or clinic by a specially trained health care professional. If you have pain, swelling, burning or any unusual feeling around the site of your injection, tell your health care professional right away. Talk to your pediatrician regarding the use of this medicine in children. Special care may be needed. Overdosage: If you think you have taken too much of this medicine contact a poison control center or emergency room at once. NOTE: This medicine is only for you. Do not share this medicine with others. What if I miss a dose? It is important not to miss your dose. Call your doctor or health care professional if you are unable to keep an appointment. What may interact with this medicine? -ciprofloxacin -cyclosporine -medicines to increase blood counts like  filgrastim, pegfilgrastim, sargramostim -other chemotherapy drugs like daunorubicin, doxorubicin, epirubicin, idarubicin, trastuzumab -vaccines Talk to your doctor or health care professional before taking any of these medicines: -acetaminophen -aspirin -ibuprofen -ketoprofen -naproxen This list may not describe all possible interactions. Give your health care provider a list of all the medicines, herbs, non-prescription drugs, or dietary supplements you use. Also tell them if you smoke, drink alcohol, or use illegal drugs. Some items may interact with your medicine. What should I watch for while using this medicine? Your condition will be monitored carefully while you are receiving this medicine. You will need important blood work done while you are taking this medicine. This drug may make you feel generally unwell. This is not uncommon, as chemotherapy can affect healthy cells as well as cancer cells. Report any side effects. Continue your course of treatment even though you feel ill unless your doctor tells you to stop. Call your doctor or health care professional for advice if you get a fever, chills or sore throat, or other symptoms of a cold or flu. Do not treat yourself. This drug decreases your body's ability to fight infections. Try to avoid being around people who are sick. This medicine may increase your risk to bruise or bleed. Call your doctor or health care professional if you notice any unusual bleeding. Be careful brushing and flossing your teeth or using a toothpick because you may get an infection or bleed more easily. If you have any dental work done, tell your dentist you are receiving this medicine. Avoid taking products that contain aspirin, acetaminophen, ibuprofen, naproxen, or ketoprofen unless instructed by your doctor. These medicines may hide a fever. Your urine may turn blue-green for a  few days after your dose. This is normal with this medicine. Do not become pregnant  while taking this medicine. Women should inform their doctor if they wish to become pregnant or think they might be pregnant. There is a potential for serious side effects to an unborn child. Take a pregnancy test as directed before each dose of this medicine. Talk to your health care professional or pharmacist for more information. Do not breast-feed an infant while taking this medicine. There is a maximum amount of this medicine you should receive throughout your life. The amount depends on the medical condition being treated and your overall health. Your doctor will watch how much of this medicine you receive in your lifetime. Tell your doctor if you have taken this medicine before. What side effects may I notice from receiving this medicine? Side effects that you should report to your doctor or health care professional as soon as possible: -allergic reactions like skin rash, itching or hives, swelling of the face, lips, or tongue -low blood counts - this medicine may decrease the number of white blood cells, red blood cells and platelets. You may be at increased risk for infections and bleeding. -signs of infection - fever or chills, cough, sore throat, pain or difficulty passing urine -signs of decreased platelets or bleeding - bruising, pinpoint red spots on the skin, black, tarry stools, blood in the urine -signs of decreased red blood cells - unusually weak or tired, fainting spells, lightheadedness -breathing problems -changes in vision -chest pain -fast, irregular heartbeat -mouth sores -nausea, vomiting -pain, swelling, redness at site where injected -swelling of the ankles, feet, hands -yellowing of the eyes or skin Side effects that usually do not require medical attention (report to your doctor or health care professional if they continue or are bothersome): -blue color in the whites of your eyes -constipation -diarrhea -hair loss -loss of appetite -missed menstrual  periods -nail discoloration or damage -stomach upset This list may not describe all possible side effects. Call your doctor for medical advice about side effects. You may report side effects to FDA at 1-800-FDA-1088. Where should I keep my medicine? This drug is given in a hospital or clinic and will not be stored at home. NOTE: This sheet is a summary. It may not cover all possible information. If you have questions about this medicine, talk to your doctor, pharmacist, or health care provider.    2016, Elsevier/Gold Standard. (2012-06-17 11:52:56) Multiple Sclerosis Multiple sclerosis (MS) is a disease of the central nervous system. It leads to the loss of the insulating covering of the nerves (myelin sheath) of your brain. When this happens, brain signals do not get sent properly or may not get sent at all. The age of onset of MS varies.  CAUSES The cause of MS is unknown. However, it is more common in the [place] than in the [place]. RISK FACTORS There is a higher number of women with MS than men. MS is not an illness that is passed down to you from your family members (inherited). However, your risk of MS is higher if you have a relative with MS. SIGNS AND SYMPTOMS  The symptoms of MS occur in episodes or attacks. These attacks may last weeks to months. There may be long periods of almost no symptoms between attacks. The symptoms of MS vary. This is because of the many different ways it affects the central nervous system. The main symptoms of MS include:  Vision problems  and eye pain.  Numbness.  Weakness.  Inability to move your arms, hands, feet, or legs (paralysis).  Balance problems.  Tremors. DIAGNOSIS  Your health care provider can diagnose MS with the help of imaging exams and lab tests. These may include specialized X-ray exams and spinal fluid tests. The best imaging exam to confirm a diagnosis of MS is an MRI. TREATMENT  There is no known  cure for MS, but there are medicines that can decrease the number and frequency of attacks. Steroids are often used for short-term relief. Physical and occupational therapy may also help. There are also many new alternative or complementary treatments available to help control the symptoms of MS. Ask your health care provider if any of these other options are right for you. HOME CARE INSTRUCTIONS   Take medicines as directed by your health care provider.  Exercise as directed by your health care provider. SEEK MEDICAL CARE IF: You begin to feel depressed. SEEK IMMEDIATE MEDICAL CARE IF:  You develop paralysis.  You have problems with bladder, bowel, or sexual function.  You develop mental changes, such as forgetfulness or mood swings.  You have a period of uncontrolled movements (seizure).   This information is not intended to replace advice given to you by your health care provider. Make sure you discuss any questions you have with your health care provider.   Document Released: 02/17/2000 Document Revised: 02/24/2013 Document Reviewed: 10/27/2012 Elsevier Interactive Patient Education [employer].

## 2015-03-01 ENCOUNTER — Ambulatory Visit: Payer: BLUE CROSS/BLUE SHIELD | Admitting: Rehabilitation

## 2015-03-03 ENCOUNTER — Ambulatory Visit: Payer: BLUE CROSS/BLUE SHIELD | Admitting: Gynecology

## 2015-03-06 DIAGNOSIS — IMO0002 Reserved for concepts with insufficient information to code with codable children: Secondary | ICD-10-CM

## 2015-03-06 HISTORY — DX: Reserved for concepts with insufficient information to code with codable children: IMO0002

## 2015-03-11 ENCOUNTER — Encounter: Payer: Self-pay | Admitting: Gynecology

## 2015-03-11 ENCOUNTER — Ambulatory Visit (INDEPENDENT_AMBULATORY_CARE_PROVIDER_SITE_OTHER): Payer: BLUE CROSS/BLUE SHIELD | Admitting: Gynecology

## 2015-03-11 VITALS — BP 124/76

## 2015-03-11 DIAGNOSIS — IMO0002 Reserved for concepts with insufficient information to code with codable children: Secondary | ICD-10-CM

## 2015-03-11 DIAGNOSIS — R896 Abnormal cytological findings in specimens from other organs, systems and tissues: Secondary | ICD-10-CM | POA: Diagnosis not present

## 2015-03-11 DIAGNOSIS — R87810 Cervical high risk human papillomavirus (HPV) DNA test positive: Secondary | ICD-10-CM | POA: Diagnosis not present

## 2015-03-11 NOTE — Progress Notes (Signed)
[place] [birth year] [id number]        39 y.o.  G0P0 Patient presents for colposcopy with a history of cone biopsy for CIN 2 in 2008. She had CIN-1 on colposcopic biopsy with negative ECC 08/2010 at another practice. Her Pap smear 2013 showed ASCUS with positive high-risk HPV and followup colposcopic biopsy showed LGSIL with negative ECC. Pap smear again was positive for LGSIL positive high-risk HPV 09/2012 with colposcopy adequate normal with negative ECC. No smears until now with most recent Pap smear showing LGSIL with positive high-risk HPV.   Past medical history,surgical history, problem list, medications, allergies, family history and social history were all reviewed and documented in the EPIC chart.  Directed ROS with pertinent positives and negatives documented in the history of present illness/assessment and plan.  Exam: [name] assistant Filed Vitals:   03/11/15 0905  BP: 124/76   General appearance:  Normal External BUS vagina normal. Cervix high in the vaginal vault. Uterus difficult to palpate but no gross masses or tenderness. Adnexa without gross masses or tenderness.  Colposcopy after acetic acid cleanse shows cervical scarring but adequate with no abnormalities. Single-tooth tenaculum anterior lip stabilization with ECC performed. Patient tolerated well. Physical Exam  Genitourinary:       Assessment/Plan:  40 y.o. G0P0 with persistent LGSIL and positive high-risk HPV, subtype 18/45. Colposcopy today adequate normal. ECC performed. If negative then we'll plan expectant management with follow up Pap smear in one year. I stressed the need for one year interval. If otherwise then will triage based on results.    Anastasio Auerbach MD, 9:20 AM 03/11/2015

## 2015-03-11 NOTE — Patient Instructions (Signed)
Office will call you with biopsy results 

## 2015-03-15 ENCOUNTER — Encounter: Payer: Self-pay | Admitting: Gynecology

## 2015-03-24 ENCOUNTER — Telehealth: Payer: Self-pay | Admitting: Neurology

## 2015-03-24 MED ORDER — PAROXETINE HCL ER 37.5 MG PO TB24: 37.5000 mg | ORAL_TABLET | Freq: Every day | ORAL | Status: DC

## 2015-03-24 NOTE — Telephone Encounter (Signed)
Pt called requesting refill for PARoxetine (PAXIL-CR) 37.5 MG 24 hr tablet . Please send to [facility]

## 2015-04-01 ENCOUNTER — Telehealth: Payer: Self-pay | Admitting: Neurology

## 2015-04-01 NOTE — Telephone Encounter (Signed)
Patient called to advise, MS has gotten worse in feet and hands, wonders if Dr. Felecia Shelling could call in something for her or if there is something he can do?

## 2015-04-01 NOTE — Telephone Encounter (Signed)
I have spoken with Hodgkinson has continued c/o numbness in her hands and feet.  As RAS has not seen her since October, and she has her reg. sched. f/u in Feb,  I have given her a sooner appt. on 04-04-15 to discuss, and cancelled her Feb. appt/fim

## 2015-04-04 ENCOUNTER — Encounter: Payer: Self-pay | Admitting: Neurology

## 2015-04-04 ENCOUNTER — Ambulatory Visit (INDEPENDENT_AMBULATORY_CARE_PROVIDER_SITE_OTHER): Payer: BLUE CROSS/BLUE SHIELD | Admitting: Neurology

## 2015-04-04 VITALS — BP 110/76 | HR 72 | Resp 16 | Ht 64.0 in | Wt 214.8 lb

## 2015-04-04 DIAGNOSIS — G629 Polyneuropathy, unspecified: Secondary | ICD-10-CM | POA: Diagnosis not present

## 2015-04-04 DIAGNOSIS — N39498 Other specified urinary incontinence: Secondary | ICD-10-CM | POA: Diagnosis not present

## 2015-04-04 DIAGNOSIS — Z5111 Encounter for antineoplastic chemotherapy: Secondary | ICD-10-CM | POA: Diagnosis not present

## 2015-04-04 DIAGNOSIS — R200 Anesthesia of skin: Secondary | ICD-10-CM

## 2015-04-04 DIAGNOSIS — R208 Other disturbances of skin sensation: Secondary | ICD-10-CM

## 2015-04-04 DIAGNOSIS — R260 Ataxic gait: Secondary | ICD-10-CM

## 2015-04-04 DIAGNOSIS — G35 Multiple sclerosis: Secondary | ICD-10-CM

## 2015-04-04 DIAGNOSIS — Z79899 Other long term (current) drug therapy: Secondary | ICD-10-CM | POA: Diagnosis not present

## 2015-04-04 NOTE — Progress Notes (Signed)
[facility]  PATIENT: Ann Oconnor DOB: [birth year]  REFERRING DOCTOR OR PCP:  Delia Chimes SOURCE: Patient, records, images  _________________________________   HISTORICAL  CHIEF COMPLAINT:  Chief Complaint  Patient presents with  . Multiple Sclerosis    Last Novantrone infusion was 02-23-15. She is here today with c/o worsening numbness left hand and both feet over the last month.  Denies other new sx./fim    HISTORY OF PRESENT ILLNESS:  Ann Oconnor is a 40 yo woman with a very aggressive MS.    She underwent her fourth Novantrone treatment  02/23/15.   She is tolerating it well.  She is having echocardiograms showing stable ejection fraction.  Numbness:   She is having more numbness in her hands and feet.    She can hurt her foot and not know it.      NCV of the hands was reportedly normal a few months ago.    Hand numbness involves the entire hand, not just the dorsum.    Foot numbness is now more bilateral worse on the right and is mostly in her toes.      We discussed that this might be due to a spinal relapse.    Numbness is not painful.   She is on gabapentin 400 mg twice a day with benefit   Gait/strength:    She is doing very well compared to early last year.   She is able to walk a mile with her walker.  She uses it for balance more than strength.  She like the walker more than a cane.    She can climb a short flight of stairs.   She is driving now short distances.  She feels weakness is very mild now but she still has spasticity, worse if she sits a long time    Bladder/bowel:   She reports no incontinence now.     No significant frequency.    Vision is fine  Fatigue/sleep:  She has mild fatigue.   This worsens a little bit as the day goes on.   She is able to sleep fairly well at night.  Mood/cognition:   Mood is much improved since earlier this year.   Now denies depression and only mild anxiety.     She remains on Paxil and has been on that for  many years.   Her cognitive issues have returned to baseline.    MS:    She presented in 2002 with numbness in her legs and poor gait.  The MRI scan showed MS plaques in the spinal cord and the brain and she was diagnosed with multiple sclerosis. She was initially placed on Rebif. At first she did well with only a couple of small central exacerbations. However, 2003 she had a more severe exacerbation that affected her gait. She was placed on a combination Rebif and Copaxone for a short period of time and then was on Copaxone monotherapy. She switched to [place] in 2011 due to needle fatigue. She had numbness in her feet that would not go away and was switched to Tecf[place]a from [place] in November 2015.  In January 2016, she lost strength in both legs and urinary incontinence. She had a course of IV steroids without any benefit in late January. Because there was no benefit, she was admitted to [facility] for a course of plasmapheresis starting February 5. She got no benefit from the plasmapheresis. During the next week, she was home but she was doing  worse and worse. She then presented to [facility]. She was admitted for another week of steroids. An MRI done at admission showed very aggressive MS changes.    Brain 04/28/2014 shows multiple infratentorial plaques including left greater than right middle cerebellar peduncles, bilateral pons, and cerebellum. Additionally there are multiple foci in the hemispheres, some with a concentric demyelinating pattern that could be consistent with Balo's concentric sclerosis, an especially fulminant form of multiple sclerosis. She was unable to complete the examination or get contrast due to the need to terminate the study early.   When compared to an MRI dated 02/07/2012, there has been dramatic change with multiple new brain stem and hemispheric foci.  MRI of the cervical spine 10/26/2010. showed multiple plaques:  right at the C2 level, left aspect C2-3 level, right aspect  C3 level, the posterior central aspect C3 level, upper C6 level slightly greater to the right and C7 level greater to the left.  Abnormal signal within the left aspect of the cord at the T3- T4 level.   MRI of the brain in 05/19/2014 showed several additional posterior fossa lesions prompting initiation of Novantrone.  Marland Kitchen     REVIEW OF SYSTEMS: Constitutional: No fevers, chills, sweats, or change in appetite.  Fatigue Eyes: No visual changes, double vision, eye pain Ear, nose and throat: No hearing loss, ear pain, nasal congestion, sore throat Cardiovascular: No chest pain, palpitations Respiratory: No shortness of breath at rest or with exertion.   No wheezes GastrointestinaI: No nausea, vomiting.  Some fecal incontinence Genitourinary: as above. Musculoskeletal: No neck pain, back pain Integumentary: No rash, pruritus, skin lesions Neurological: as above Psychiatric: Notes depression at this time.  Some anxiety Endocrine: No palpitations, diaphoresis, change in appetite, change in weigh or increased thirst Hematologic/Lymphatic: No anemia, purpura, petechiae. Allergic/Immunologic: No itchy/runny eyes, nasal congestion, recent allergic reactions, rashes  ALLERGIES: No Known Allergies  HOME MEDICATIONS:  Current outpatient prescriptions:  .  diclofenac (VOLTAREN) 75 MG EC tablet, TK 1 T PO  BID AFTER MEALS FOR IMFLAMMATION/ PAIN/ SWELLING, Disp: , Rfl: 3 .  gabapentin (NEURONTIN) 400 MG capsule, TAKE 1 CAPSULE(400 MG) BY MOUTH TWICE DAILY, Disp: 60 capsule, Rfl: 6 .  Mitoxantrone HCl (NOVANTRONE IV), Inject 27 mg into the vein every 3 (three) months. Reported on 03/11/2015, Disp: , Rfl:  .  PARoxetine (PAXIL-CR) 37.5 MG 24 hr tablet, Take 1 tablet (37.5 mg total) by mouth daily., Disp: 30 tablet, Rfl: 3 No current facility-administered medications for this visit.  Facility-Administered Medications Ordered in Other Visits:  .  gadopentetate dimeglumine (MAGNEVIST) injection 20 mL, 20  mL, Intravenous, Once PRN, Britt Bottom, MD  PAST MEDICAL HISTORY: Past Medical History  Diagnosis Date  . MS (multiple sclerosis) ([place]) 2002  . 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  Colpo normal with neg ECC  . Movement disorder   . Vision abnormalities   . Family history of adverse reaction to anesthesia     Father - difficulty awaken- has sleep apena  . Seizures ([place]) 2003  . Head injury, closed, with brief LOC ([place]) 2003    fall  . Anxiety   . UTI (lower urinary tract infection)   . Gait instability   . Incontinence     PAST SURGICAL HISTORY: Past Surgical History  Procedure Laterality Date  . Cervical cone biopsy  2008    CIN 2  . Portacath placement Left 08/18/14  .  Portacath placement Left 08/18/2014    Procedure: LEFT SUBCLAVIAN VEIN PORT PLACEMENT;  Surgeon: Donnie Mesa, MD;  Location: [facility] OR;  Service: General;  Laterality: Left;    FAMILY HISTORY: Family History  Problem Relation Age of Onset  . Thyroid disease Mother   . Thyroid disease Sister   . Cancer Paternal Aunt     not sure what kind, maybe cervix  . Thyroid disease Sister   . Heart disease Father   . Mitral valve prolapse Father     SOCIAL HISTORY:  Social History   Social History  . Marital Status: Single    Spouse Name: N/A  . Number of Children: N/A  . Years of Education: N/A   Occupational History  . Not on file.   Social History Main Topics  . Smoking status: Never Smoker   . Smokeless tobacco: Never Used  . Alcohol Use: No  . Drug Use: No  . Sexual Activity: No   Other Topics Concern  . Not on file   Social History Narrative     PHYSICAL EXAM  Filed Vitals:   04/04/15 1031  BP: 110/76  Pulse: 72  Resp: 16  Height: 5\' 4"  (1.626 m)  Weight: 214 lb 12.8 oz (97.433 kg)    Body mass index is 36.85 kg/(m^2).   General: The patient is well-developed and well-nourished and in no acute  distress   Neurologic Exam  Mental status: The patient is alert and oriented x 3 at the time of the examination. The patient has apparent normal recent and remote memory,  attention span and concentration ability now seem normal.   Speech is normal.     Cranial nerves: Extraocular movements are full. Facial symmetry is present. There is good facial sensation to soft touch bilaterally.Facial strength is normal.  Trapezius and sternocleidomastoid strength is normal. No dysarthria is noted.  No obvious hearing deficits are noted.  Motor:  Muscle bulk is normal.   Tone is mildly increased on the rightg. Strength is  5/5 in right arm, 5/5, left arm.   4+ right leg (4/5 EHL) and 5/5 left leg  Sensory: Sensory testing is intact to pinprick, soft touch and vibration sensation in arms but decreased vibration and touch/temp in feet below ankles  Coordination: Cerebellar testing reveals mild finger-nose-finger and RAM, worse on right.  Reduced right worse than left heel-to-shin bilaterally.  Gait and station: She has a good station. When she is up she is able to walk fairly well with a good stride using her walker..   Reflexes: Deep tendon reflexes are increased bilaterally, slightly more on the right.     Other:   She does not have any Tinel's signs at the wrists or elbows    DIAGNOSTIC DATA (LABS, IMAGING, TESTING) - I reviewed patient records, labs, notes, testing and imaging myself where available.  Lab Results  Component Value Date   WBC 7.2 02/22/2015   HGB 12.5 08/18/2014   HCT 35.2 02/22/2015   MCV 95 02/22/2015   PLT 349 02/22/2015      Component Value Date/Time   NA 143 02/22/2015 1631   NA 141 08/18/2014 0952   K 4.1 02/22/2015 1631   CL 101 02/22/2015 1631   CO2 25 02/22/2015 1631   GLUCOSE 99 02/22/2015 1631   GLUCOSE 98 08/18/2014 0952   BUN 15 02/22/2015 1631   BUN 7 08/18/2014 0952   CREATININE 0.88 02/22/2015 1631   CREATININE 0.5 05/17/2014   CREATININE 0.75  09/02/2012 1057   CALCIUM 9.2 02/22/2015 1631   PROT 6.9 02/22/2015 1631   PROT 7.3 08/20/2014 0815   ALBUMIN 4.4 02/22/2015 1631   ALBUMIN 3.9 08/20/2014 0815   AST 20 02/22/2015 1631   ALT 13 02/22/2015 1631   ALKPHOS 83 02/22/2015 1631   BILITOT 0.2 02/22/2015 1631   BILITOT 0.5 08/20/2014 0815   GFRNONAA 82 02/22/2015 1631   GFRAA 95 02/22/2015 1631   Lab Results  Component Value Date   CHOL 217* 02/09/2013   HDL 45 02/09/2013   LDLCALC 146* 02/09/2013   TRIG 130 02/09/2013   CHOLHDL 4.8 02/09/2013   No results found for: HGBA1C Lab Results  Component Value Date   VITAMINB12 302 09/14/2008   Lab Results  Component Value Date   TSH 2.871 08/21/2011       ASSESSMENT AND PLAN  Multiple sclerosis (HCC)  Ataxic gait  High risk medication use  Numbness of both lower extremities  Other urinary incontinence  1.   We will area with her do a final Novantrone in March or switch her to ocrelizumab if it becomes available by then. 2.    We also discussed Zinbryta as possible medications to switch to if ocrelizumab not available 3,   Continue other meds. 4.    Check B12, copper, SPEP and consider NCV/EMG if numbness worsens.  RTC 3 -4 months or sooner if problems.    Richard A. Felecia Shelling, MD, PhD [id number], [id number] AM Certified in Neurology, Clinical Neurophysiology, Sleep Medicine, Pain Medicine and Neuroimaging  [facility] [address] [place], [place] [address] [phone]  BSA = 2.28  Dose 12 mg/M2 = 27.35

## 2015-04-06 ENCOUNTER — Telehealth: Payer: Self-pay | Admitting: *Deleted

## 2015-04-06 LAB — MULTIPLE MYELOMA PANEL, SERUM
ALBUMIN/GLOB SERPL: 1.2 (ref 0.7–1.7)
ALPHA2 GLOB SERPL ELPH-MCNC: 0.9 g/dL (ref 0.4–1.0)
Albumin SerPl Elph-Mcnc: 3.7 g/dL (ref 2.9–4.4)
Alpha 1: 0.3 g/dL (ref 0.0–0.4)
B-GLOBULIN SERPL ELPH-MCNC: 1.1 g/dL (ref 0.7–1.3)
GAMMA GLOB SERPL ELPH-MCNC: 1 g/dL (ref 0.4–1.8)
GLOBULIN, TOTAL: 3.3 g/dL (ref 2.2–3.9)
IGG (IMMUNOGLOBIN G), SERUM: 945 mg/dL (ref 700–1600)
IGM (IMMUNOGLOBULIN M), SRM: 29 mg/dL (ref 26–217)
IgA/Immunoglobulin A, Serum: 100 mg/dL (ref 87–352)
Total Protein: 7 g/dL (ref 6.0–8.5)

## 2015-04-06 LAB — COPPER, SERUM: COPPER: 136 ug/dL (ref 72–166)

## 2015-04-06 LAB — VITAMIN B12: VITAMIN B 12: 348 pg/mL (ref 211–946)

## 2015-04-06 NOTE — Telephone Encounter (Signed)
I have spoken with Ann Oconnor this morning and per RAS, advised that labwork is fine; if numbness worsens, she should call us back and RAS will order an emg/ncs.  She verbalized understanding of same, is agreeable with this plan/fim

## 2015-04-06 NOTE — Telephone Encounter (Signed)
-----   Message from Britt Bottom, MD sent at 04/06/2015  9:06 AM EST ----- Labs are fine. If the numbness worsens, she can call back and I will set up a NCV/EMG to make sure numbness is not due to a polyneuropathy.

## 2015-04-26 ENCOUNTER — Ambulatory Visit: Payer: BLUE CROSS/BLUE SHIELD | Admitting: Neurology

## 2015-04-28 ENCOUNTER — Other Ambulatory Visit: Payer: Self-pay

## 2015-04-28 DIAGNOSIS — Z1231 Encounter for screening mammogram for malignant neoplasm of breast: Secondary | ICD-10-CM

## 2015-05-11 ENCOUNTER — Ambulatory Visit: Payer: BLUE CROSS/BLUE SHIELD

## 2015-05-16 ENCOUNTER — Other Ambulatory Visit: Payer: Self-pay | Admitting: *Deleted

## 2015-05-16 ENCOUNTER — Telehealth: Payer: Self-pay | Admitting: Neurology

## 2015-05-16 DIAGNOSIS — Z79899 Other long term (current) drug therapy: Secondary | ICD-10-CM

## 2015-05-16 DIAGNOSIS — G35 Multiple sclerosis: Secondary | ICD-10-CM

## 2015-05-16 NOTE — Telephone Encounter (Signed)
Patient called to advise, "it's time for another infusion', doesn't know if Dr. Felecia Shelling wants to do another infusion or if there is another medication he has come up with? Please call to advise.

## 2015-05-16 NOTE — Telephone Encounter (Signed)
Ocrelizumab is not out, so per RAS, pt. to have one more dose of Novantrone.  Orders faxed to [facility] short stay.  Echo and labs (cbc with diff/platelets, cmp, lft's) ordered.  Pt. aware and agreeable with this plan/fim

## 2015-05-17 ENCOUNTER — Other Ambulatory Visit (INDEPENDENT_AMBULATORY_CARE_PROVIDER_SITE_OTHER): Payer: Self-pay

## 2015-05-17 DIAGNOSIS — Z79899 Other long term (current) drug therapy: Secondary | ICD-10-CM

## 2015-05-17 DIAGNOSIS — G35 Multiple sclerosis: Secondary | ICD-10-CM

## 2015-05-17 DIAGNOSIS — Z0289 Encounter for other administrative examinations: Secondary | ICD-10-CM

## 2015-05-18 ENCOUNTER — Telehealth: Payer: Self-pay | Admitting: *Deleted

## 2015-05-18 ENCOUNTER — Telehealth: Payer: Self-pay | Admitting: Neurology

## 2015-05-18 LAB — CBC WITH DIFFERENTIAL/PLATELET
BASOS: 1 %
Basophils Absolute: 0 10*3/uL (ref 0.0–0.2)
EOS (ABSOLUTE): 0.1 10*3/uL (ref 0.0–0.4)
EOS: 1 %
HEMATOCRIT: 36.4 % (ref 34.0–46.6)
Hemoglobin: 12 g/dL (ref 11.1–15.9)
Immature Grans (Abs): 0 10*3/uL (ref 0.0–0.1)
Immature Granulocytes: 0 %
LYMPHS ABS: 1.6 10*3/uL (ref 0.7–3.1)
Lymphs: 33 %
MCH: 30.9 pg (ref 26.6–33.0)
MCHC: 33 g/dL (ref 31.5–35.7)
MCV: 94 fL (ref 79–97)
MONOS ABS: 0.4 10*3/uL (ref 0.1–0.9)
Monocytes: 9 %
NEUTROS ABS: 2.8 10*3/uL (ref 1.4–7.0)
Neutrophils: 56 %
Platelets: 323 10*3/uL (ref 150–379)
RBC: 3.88 x10E6/uL (ref 3.77–5.28)
RDW: 14 % (ref 12.3–15.4)
WBC: 4.9 10*3/uL (ref 3.4–10.8)

## 2015-05-18 LAB — COMPREHENSIVE METABOLIC PANEL
A/G RATIO: 1.7 (ref 1.2–2.2)
ALK PHOS: 90 IU/L (ref 39–117)
ALT: 8 IU/L (ref 0–32)
AST: 17 IU/L (ref 0–40)
Albumin: 4.5 g/dL (ref 3.5–5.5)
BUN / CREAT RATIO: 18 (ref 9–23)
BUN: 14 mg/dL (ref 6–24)
Bilirubin Total: <0.2 mg/dL (ref 0.0–1.2)
CO2: 25 mmol/L (ref 18–29)
Calcium: 9.2 mg/dL (ref 8.7–10.2)
Chloride: 99 mmol/L (ref 96–106)
Creatinine, Ser: 0.78 mg/dL (ref 0.57–1.00)
GFR calc Af Amer: 109 mL/min/{1.73_m2} (ref >59)
GFR calc non Af Amer: 95 mL/min/{1.73_m2} (ref >59)
GLOBULIN, TOTAL: 2.7 g/dL (ref 1.5–4.5)
Glucose: 93 mg/dL (ref 65–99)
POTASSIUM: 5.2 mmol/L (ref 3.5–5.2)
SODIUM: 139 mmol/L (ref 134–144)
Total Protein: 7.2 g/dL (ref 6.0–8.5)

## 2015-05-18 LAB — HEPATIC FUNCTION PANEL: BILIRUBIN, DIRECT: 0.05 mg/dL (ref 0.00–0.40)

## 2015-05-18 NOTE — Telephone Encounter (Signed)
-----   Message from Britt Bottom, MD sent at 05/18/2015  8:43 AM EDT ----- Labs look good. She can get her next Novantrone (mitoxantrone)  Dose after she gets an echo since ocrelizumab is not yet out

## 2015-05-18 NOTE — Telephone Encounter (Signed)
I have spoken with Ann Oconnor this afternoon and per RAS, advised labs are ok for one more Novantrone infusion.  She verbalized understanding of same. She is waiting for echo to be scheduled/fim

## 2015-05-18 NOTE — Telephone Encounter (Signed)
[facility], short stay , received pt orders. The last orders included labs -cbc with diff, cmet , bilirubin and creatine echo with ef >50%, ekg. She needs to know if pt has done any of this here prior to pt coming in. Pt will be calling to schedule. They just need this in advance. [phone], fax [phone]

## 2015-05-18 NOTE — Telephone Encounter (Signed)
LMOM for [facility] that [place] had labs yesterday, and echo has been ordered, to be done prior to Novantrone/fim

## 2015-05-19 ENCOUNTER — Telehealth: Payer: Self-pay | Admitting: Neurology

## 2015-05-19 NOTE — Telephone Encounter (Signed)
Pt called to schedule echo and was told that the orders were not put in correctly. She called the [facility] on [facility]. May call pt at [phone]

## 2015-05-19 NOTE — Telephone Encounter (Signed)
Called and spoke to patient she is scheduled for ECHO Limited Tues . March 21 st at [facility] . Arrive at 8:45 am . Patient is aware of all details. And is fine with apt status.

## 2015-05-19 NOTE — Telephone Encounter (Signed)
I have spoken with Ann Oconnor and advised Hinton Dyer in our office is working on getting this scheduled for her.  She verbalized understanding of same/fim

## 2015-05-24 ENCOUNTER — Ambulatory Visit (HOSPITAL_COMMUNITY)
Admission: RE | Admit: 2015-05-24 | Discharge: 2015-05-24 | Disposition: A | Discharge status: Home / Self Care | Payer: BLUE CROSS/BLUE SHIELD | Source: Ambulatory Visit | Attending: Neurology | Admitting: Neurology

## 2015-05-24 DIAGNOSIS — Z6836 Body mass index (BMI) 36.0-36.9, adult: Secondary | ICD-10-CM | POA: Insufficient documentation

## 2015-05-24 DIAGNOSIS — E669 Obesity, unspecified: Secondary | ICD-10-CM | POA: Diagnosis not present

## 2015-05-24 DIAGNOSIS — Z79899 Other long term (current) drug therapy: Secondary | ICD-10-CM | POA: Diagnosis not present

## 2015-05-24 DIAGNOSIS — Z09 Encounter for follow-up examination after completed treatment for conditions other than malignant neoplasm: Secondary | ICD-10-CM | POA: Diagnosis present

## 2015-05-24 DIAGNOSIS — G35 Multiple sclerosis: Secondary | ICD-10-CM | POA: Diagnosis not present

## 2015-05-24 DIAGNOSIS — I340 Nonrheumatic mitral (valve) insufficiency: Secondary | ICD-10-CM | POA: Diagnosis not present

## 2015-05-24 NOTE — Progress Notes (Signed)
Echocardiogram 2D Echocardiogram limited has been performed.  Ann Oconnor 05/24/2015, 9:37 AM

## 2015-05-25 ENCOUNTER — Telehealth: Payer: Self-pay | Admitting: *Deleted

## 2015-05-25 NOTE — Telephone Encounter (Signed)
-----   Message from Britt Bottom, MD sent at 05/24/2015  6:08 PM EDT ----- Please let her know that the echocardiogram was unchanged. We can go ahead and schedule her next Novantrone. Hopefully, ocrelizumab will be out before she would need to be infused again after this one.

## 2015-05-25 NOTE — Telephone Encounter (Signed)
I have spoken with Ann Oconnor and per RAS, advised echo was ok for Novantrone.  [name] verbalized understanding of same. I have spoken with [name] at [facility] and sched. Novantrone for 06-03-15 at [place] is agreeable with this date/time/fim

## 2015-06-02 ENCOUNTER — Telehealth: Payer: Self-pay | Admitting: Neurology

## 2015-06-02 NOTE — Telephone Encounter (Signed)
Patient called, wants to know "if the new medication that was just approved, is the medication Dr. Felecia Shelling is going to put her on". Please call to advise.

## 2015-06-02 NOTE — Telephone Encounter (Signed)
I have spoken with Ann Oconnor this morning--advised that RAS has considered Ocrelizumab for her and may want her to start it at sometime in the future, but for now, he would like her to continue with one more dose of Novantrone, which she is scheduled for tomorrow.  She verbalized understanding of same/fim

## 2015-06-03 ENCOUNTER — Encounter (HOSPITAL_COMMUNITY): Payer: Self-pay

## 2015-06-03 ENCOUNTER — Encounter (HOSPITAL_COMMUNITY)
Admission: RE | Admit: 2015-06-03 | Discharge: 2015-06-03 | Disposition: A | Discharge status: Home / Self Care | Payer: BLUE CROSS/BLUE SHIELD | Source: Ambulatory Visit | Attending: Neurology | Admitting: Neurology

## 2015-06-03 DIAGNOSIS — G35 Multiple sclerosis: Secondary | ICD-10-CM | POA: Diagnosis not present

## 2015-06-03 MED ORDER — ACETAMINOPHEN 325 MG PO TABS
650.0000 mg | ORAL_TABLET | Freq: Once | ORAL | Status: AC
  Administered 2015-06-03: 650 mg via ORAL
  Filled 2015-06-03: qty 2

## 2015-06-03 MED ORDER — SODIUM CHLORIDE 0.9 % IV SOLN
Freq: Once | INTRAVENOUS | Status: AC
  Administered 2015-06-03: 08:00:00 via INTRAVENOUS

## 2015-06-03 MED ORDER — HEPARIN SOD (PORK) LOCK FLUSH 100 UNIT/ML IV SOLN
500.0000 [IU] | INTRAVENOUS | Status: AC | PRN
  Administered 2015-06-03: 500 [IU]
  Filled 2015-06-03: qty 5

## 2015-06-03 MED ORDER — SODIUM CHLORIDE 0.9% FLUSH
10.0000 mL | INTRAVENOUS | Status: AC | PRN
  Administered 2015-06-03: 10 mL

## 2015-06-03 MED ORDER — SODIUM CHLORIDE 0.9 % IV SOLN
8.0000 mg | Freq: Once | INTRAVENOUS | Status: AC
  Administered 2015-06-03: 8 mg via INTRAVENOUS
  Filled 2015-06-03: qty 4

## 2015-06-03 MED ORDER — SODIUM CHLORIDE 0.9 % IV SOLN
27.0000 mg | Freq: Once | INTRAVENOUS | Status: AC
  Administered 2015-06-03: 28 mg via INTRAVENOUS
  Filled 2015-06-03: qty 14

## 2015-06-03 NOTE — Progress Notes (Signed)
Uneventful infusion of Novantrone today. Pt discharged ambulatory using cane inaccompanied to elevator. Pt states she drove herself today and used valet parking

## 2015-06-03 NOTE — Discharge Instructions (Signed)
NOVANTRONE Mitoxantrone injection What is this medicine? MITOXANTRONE (MYE toe ZAN trone) is a chemotherapy drug. It targets fast dividing cells, like cancer cells, and causes these cells to die. This medicine is used to treat acute nonlymphocytic leukemia (ANLL) and advanced prostate cancer. It is also used to treat certain types of multiple sclerosis. This medicine may be used for other purposes; ask your health care provider or pharmacist if you have questions. What should I tell my health care provider before I take this medicine? They need to know if you have any of these conditions: -heart disease -infection (especially virus infection such as chickenpox or herpes) -liver disease -low blood counts, like low platelets, red blood cells, white blood cells -previous chemotherapy, especially with doxorubicin, daunorubicin, epirubicin, or idarubicin -recent or ongoing radiation therapy -an unusual or allergic reaction to mitoxantrone, other medicines, foods, dyes, or preservatives -pregnant or are trying to get pregnant -breast-feeding How should I use this medicine? This drug is given as an infusion into a vein. It is administered in a hospital or clinic by a specially trained health care professional. If you have pain, swelling, burning or any unusual feeling around the site of your injection, tell your health care professional right away. Talk to your pediatrician regarding the use of this medicine in children. Special care may be needed. Overdosage: If you think you have taken too much of this medicine contact a poison control center or emergency room at once. NOTE: This medicine is only for you. Do not share this medicine with others. What if I miss a dose? It is important not to miss your dose. Call your doctor or health care professional if you are unable to keep an appointment. What may interact with this medicine? -ciprofloxacin -cyclosporine -medicines to increase blood counts  like filgrastim, pegfilgrastim, sargramostim -other chemotherapy drugs like daunorubicin, doxorubicin, epirubicin, idarubicin, trastuzumab -vaccines Talk to your doctor or health care professional before taking any of these medicines: -acetaminophen -aspirin -ibuprofen -ketoprofen -naproxen This list may not describe all possible interactions. Give your health care provider a list of all the medicines, herbs, non-prescription drugs, or dietary supplements you use. Also tell them if you smoke, drink alcohol, or use illegal drugs. Some items may interact with your medicine. What should I watch for while using this medicine? Your condition will be monitored carefully while you are receiving this medicine. You will need important blood work done while you are taking this medicine. This drug may make you feel generally unwell. This is not uncommon, as chemotherapy can affect healthy cells as well as cancer cells. Report any side effects. Continue your course of treatment even though you feel ill unless your doctor tells you to stop. Call your doctor or health care professional for advice if you get a fever, chills or sore throat, or other symptoms of a cold or flu. Do not treat yourself. This drug decreases your body's ability to fight infections. Try to avoid being around people who are sick. This medicine may increase your risk to bruise or bleed. Call your doctor or health care professional if you notice any unusual bleeding. Be careful brushing and flossing your teeth or using a toothpick because you may get an infection or bleed more easily. If you have any dental work done, tell your dentist you are receiving this medicine. Avoid taking products that contain aspirin, acetaminophen, ibuprofen, naproxen, or ketoprofen unless instructed by your doctor. These medicines may hide a fever. Your urine may turn blue-green for  a few days after your dose. This is normal with this medicine. Do not become  pregnant while taking this medicine. Women should inform their doctor if they wish to become pregnant or think they might be pregnant. There is a potential for serious side effects to an unborn child. Take a pregnancy test as directed before each dose of this medicine. Talk to your health care professional or pharmacist for more information. Do not breast-feed an infant while taking this medicine. There is a maximum amount of this medicine you should receive throughout your life. The amount depends on the medical condition being treated and your overall health. Your doctor will watch how much of this medicine you receive in your lifetime. Tell your doctor if you have taken this medicine before. What side effects may I notice from receiving this medicine? Side effects that you should report to your doctor or health care professional as soon as possible: -allergic reactions like skin rash, itching or hives, swelling of the face, lips, or tongue -low blood counts - this medicine may decrease the number of white blood cells, red blood cells and platelets. You may be at increased risk for infections and bleeding. -signs of infection - fever or chills, cough, sore throat, pain or difficulty passing urine -signs of decreased platelets or bleeding - bruising, pinpoint red spots on the skin, black, tarry stools, blood in the urine -signs of decreased red blood cells - unusually weak or tired, fainting spells, lightheadedness -breathing problems -changes in vision -chest pain -fast, irregular heartbeat -mouth sores -nausea, vomiting -pain, swelling, redness at site where injected -swelling of the ankles, feet, hands -yellowing of the eyes or skin Side effects that usually do not require medical attention (report to your doctor or health care professional if they continue or are bothersome): -blue color in the whites of your eyes -constipation -diarrhea -hair loss -loss of appetite -missed menstrual  periods -nail discoloration or damage -stomach upset This list may not describe all possible side effects. Call your doctor for medical advice about side effects. You may report side effects to FDA at 1-800-FDA-1088. Where should I keep my medicine? This drug is given in a hospital or clinic and will not be stored at home. NOTE: This sheet is a summary. It may not cover all possible information. If you have questions about this medicine, talk to your doctor, pharmacist, or health care provider.    2016, Elsevier/Gold Standard. (2012-06-17 11:52:56) Multiple Sclerosis Multiple sclerosis (MS) is a disease of the central nervous system. It leads to the loss of the insulating covering of the nerves (myelin sheath) of your brain. When this happens, brain signals do not get sent properly or may not get sent at all. The age of onset of MS varies.  CAUSES The cause of MS is unknown. However, it is more common in the [place] than in the [place]. RISK FACTORS There is a higher number of women with MS than men. MS is not an illness that is passed down to you from your family members (inherited). However, your risk of MS is higher if you have a relative with MS. SIGNS AND SYMPTOMS  The symptoms of MS occur in episodes or attacks. These attacks may last weeks to months. There may be long periods of almost no symptoms between attacks. The symptoms of MS vary. This is because of the many different ways it affects the central nervous system. The main symptoms of MS include:  Vision  problems and eye pain.  Numbness.  Weakness.  Inability to move your arms, hands, feet, or legs (paralysis).  Balance problems.  Tremors. DIAGNOSIS  Your health care provider can diagnose MS with the help of imaging exams and lab tests. These may include specialized X-ray exams and spinal fluid tests. The best imaging exam to confirm a diagnosis of MS is an MRI. TREATMENT  There is no known  cure for MS, but there are medicines that can decrease the number and frequency of attacks. Steroids are often used for short-term relief. Physical and occupational therapy may also help. There are also many new alternative or complementary treatments available to help control the symptoms of MS. Ask your health care provider if any of these other options are right for you. HOME CARE INSTRUCTIONS   Take medicines as directed by your health care provider.  Exercise as directed by your health care provider. SEEK MEDICAL CARE IF: You begin to feel depressed. SEEK IMMEDIATE MEDICAL CARE IF:  You develop paralysis.  You have problems with bladder, bowel, or sexual function.  You develop mental changes, such as forgetfulness or mood swings.  You have a period of uncontrolled movements (seizure).   This information is not intended to replace advice given to you by your health care provider. Make sure you discuss any questions you have with your health care provider.   Document Released: 02/17/2000 Document Revised: 02/24/2013 Document Reviewed: 10/27/2012 Elsevier Interactive Patient Education [employer].

## 2015-06-29 ENCOUNTER — Other Ambulatory Visit: Payer: Self-pay | Admitting: Neurology

## 2015-06-30 ENCOUNTER — Ambulatory Visit
Admission: RE | Admit: 2015-06-30 | Discharge: 2015-06-30 | Disposition: A | Discharge status: Home / Self Care | Payer: BLUE CROSS/BLUE SHIELD | Source: Ambulatory Visit

## 2015-06-30 DIAGNOSIS — Z1231 Encounter for screening mammogram for malignant neoplasm of breast: Secondary | ICD-10-CM

## 2015-07-03 ENCOUNTER — Other Ambulatory Visit: Payer: Self-pay | Admitting: Neurology

## 2015-08-02 ENCOUNTER — Encounter: Payer: Self-pay | Admitting: Neurology

## 2015-08-02 ENCOUNTER — Ambulatory Visit (INDEPENDENT_AMBULATORY_CARE_PROVIDER_SITE_OTHER): Payer: BLUE CROSS/BLUE SHIELD | Admitting: Neurology

## 2015-08-02 VITALS — BP 110/76 | HR 72 | Resp 16 | Ht 64.0 in | Wt 217.0 lb

## 2015-08-02 DIAGNOSIS — Z79899 Other long term (current) drug therapy: Secondary | ICD-10-CM | POA: Diagnosis not present

## 2015-08-02 DIAGNOSIS — R260 Ataxic gait: Secondary | ICD-10-CM | POA: Diagnosis not present

## 2015-08-02 DIAGNOSIS — F418 Other specified anxiety disorders: Secondary | ICD-10-CM | POA: Diagnosis not present

## 2015-08-02 DIAGNOSIS — N39498 Other specified urinary incontinence: Secondary | ICD-10-CM

## 2015-08-02 DIAGNOSIS — G35 Multiple sclerosis: Secondary | ICD-10-CM

## 2015-08-02 NOTE — Progress Notes (Signed)
[facility]  PATIENT: Ann Oconnor DOB: [birth year]  REFERRING DOCTOR OR PCP:  Delia Chimes SOURCE: Patient, records, images  _________________________________   HISTORICAL  CHIEF COMPLAINT:  Chief Complaint  Patient presents with  . Multiple Sclerosis    Last Novantrone infusion was 06-03-15 at [facility].  Denies new or worsening sx. Here today to discuss switching to Ocrelizumab/fim    HISTORY OF PRESENT ILLNESS:  Ann Oconnor is a 40 yo woman with a very aggressive MS.    She underwent her fifth Novantrone treatment  Late March, 2016.   She is tolerating it well.  Her echocardiograms areshowing stable ejection fraction.   She denies any recent exacerbation.     Numbness:   She is having more numbness in her hand and left = right foot.    Numbness is not complete as it was but is still severe.   She has to be careful walking on uneven surfaces because she does not feel her foot well when it makes contact.     NCV of the hands was reportedly normal a few months ago.    Hand numbness involves the entire left hand.    Numbness is not painful.   She is on gabapentin 400 mg twice a day with benefit   Gait/strength:    She is doing very well compared to early last year.   She is able to walk a mile with her cane  (4 prong).  She uses cane for balance more than strength.      She can climb a short flight of stairs.   She is driving now short distances.  She feels weakness is very mild now but she still has left > right spasticity.  Bladder/bowel:   She reports no incontinence since last visit.     No significant frequency.    Vision:  She denies MS related visual issues.  Fatigue/sleep:  She has mild fatigue.   This worsens a little bit as the day goes on, especially in heat.   She is able to sleep fairly well at night.  Mood/cognition:   Mood is much improved since earlier this year.   Now denies depression and only mild anxiety.     She remains on Paxil  and has been on that for many years.   Her cognitive issues have returned close to baseline.  She notes some verbal fluency issues.    MS:    She presented in 2002 with numbness in her legs and poor gait.  The MRI scan showed MS plaques in the spinal cord and the brain and she was diagnosed with multiple sclerosis. She was initially placed on Rebif. At first she did well with only a couple of small central exacerbations. However, 2003 she had a more severe exacerbation that affected her gait. She was placed on a combination Rebif and Copaxone for a short period of time and then was on Copaxone monotherapy. She switched to [place] in 2011 due to needle fatigue. She had numbness in her feet that would not go away and was switched to Tecfidera from [place] in November 2015.  In January 2016, she lost strength in both legs and urinary incontinence. She had a course of IV steroids without any benefit in late January. Because there was no benefit, she was admitted to [facility] for a course of plasmapheresis starting February 5. She got no benefit from the plasmapheresis. During the next week, she was home but she  was doing worse and worse. She then presented to [facility]. She was admitted for another week of steroids. An MRI done at admission showed very aggressive MS changes.    Brain 04/28/2014 shows multiple infratentorial plaques including left greater than right middle cerebellar peduncles, bilateral pons, and cerebellum. Additionally there are multiple foci in the hemispheres, some with a concentric demyelinating pattern that could be consistent with Balo's concentric sclerosis, an especially fulminant form of multiple sclerosis. She was unable to complete the examination or get contrast due to the need to terminate the study early.   When compared to an MRI dated 02/07/2012, there has been dramatic change with multiple new brain stem and hemispheric foci.  MRI of the cervical spine 10/26/2010. showed multiple  plaques:  right at the C2 level, left aspect C2-3 level, right aspect C3 level, the posterior central aspect C3 level, upper C6 level slightly greater to the right and C7 level greater to the left.  Abnormal signal within the left aspect of the cord at the T3- T4 level.   MRI of the brain in 05/19/2014 showed several additional posterior fossa lesions prompting initiation of Novantrone.  Marland Kitchen     REVIEW OF SYSTEMS: Constitutional: No fevers, chills, sweats, or change in appetite.  Fatigue Eyes: No visual changes, double vision, eye pain Ear, nose and throat: No hearing loss, ear pain, nasal congestion, sore throat Cardiovascular: No chest pain, palpitations Respiratory: No shortness of breath at rest or with exertion.   No wheezes GastrointestinaI: No nausea, vomiting.  Some fecal incontinence Genitourinary: as above. Musculoskeletal: No neck pain, back pain Integumentary: No rash, pruritus, skin lesions Neurological: as above Psychiatric: Notes depression at this time.  Some anxiety Endocrine: No palpitations, diaphoresis, change in appetite, change in weigh or increased thirst Hematologic/Lymphatic: No anemia, purpura, petechiae. Allergic/Immunologic: No itchy/runny eyes, nasal congestion, recent allergic reactions, rashes  ALLERGIES: No Known Allergies  HOME MEDICATIONS:  Current outpatient prescriptions:  .  gabapentin (NEURONTIN) 400 MG capsule, TAKE 1 CAPSULE(400 MG) BY MOUTH TWICE DAILY, Disp: 60 capsule, Rfl: 11 .  Mitoxantrone HCl (NOVANTRONE IV), Inject 27 mg into the vein every 3 (three) months. Reported on 03/11/2015, Disp: , Rfl:  .  PARoxetine (PAXIL-CR) 37.5 MG 24 hr tablet, TAKE 1 TABLET(37.5 MG) BY MOUTH DAILY, Disp: 30 tablet, Rfl: 11 .  rosuvastatin (CRESTOR) 10 MG tablet, , Disp: , Rfl: 5 No current facility-administered medications for this visit.  Facility-Administered Medications Ordered in Other Visits:  .  gadopentetate dimeglumine (MAGNEVIST) injection 20 mL, 20  mL, Intravenous, Once PRN, Britt Bottom, MD  PAST MEDICAL HISTORY: Past Medical History  Diagnosis Date  . MS (multiple sclerosis) ([place]) 2002  . 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  Colpo normal with neg ECC  . Movement disorder   . Vision abnormalities   . Family history of adverse reaction to anesthesia     Father - difficulty awaken- has sleep apena  . Seizures ([place]) 2003  . Head injury, closed, with brief LOC ([place]) 2003    fall  . Anxiety   . UTI (lower urinary tract infection)   . Gait instability   . Incontinence     PAST SURGICAL HISTORY: Past Surgical History  Procedure Laterality Date  . Cervical cone biopsy  2008    CIN 2  . Portacath placement Left 08/18/14  . Portacath placement Left 08/18/2014    Procedure: LEFT SUBCLAVIAN VEIN  PORT PLACEMENT;  Surgeon: Donnie Mesa, MD;  Location: [facility] OR;  Service: General;  Laterality: Left;    FAMILY HISTORY: Family History  Problem Relation Age of Onset  . Thyroid disease Mother   . Thyroid disease Sister   . Cancer Paternal Aunt     not sure what kind, maybe cervix  . Thyroid disease Sister   . Heart disease Father   . Mitral valve prolapse Father     SOCIAL HISTORY:  Social History   Social History  . Marital Status: Single    Spouse Name: N/A  . Number of Children: N/A  . Years of Education: N/A   Occupational History  . Not on file.   Social History Main Topics  . Smoking status: Never Smoker   . Smokeless tobacco: Never Used  . Alcohol Use: No  . Drug Use: No  . Sexual Activity: No   Other Topics Concern  . Not on file   Social History Narrative     PHYSICAL EXAM  Filed Vitals:   08/02/15 0927  BP: 110/76  Pulse: 72  Resp: 16  Height: 5\' 4"  (1.626 m)  Weight: 217 lb (98.431 kg)    Body mass index is 37.23 kg/(m^2).   General: The patient is well-developed and well-nourished and in no acute distress   Neurologic  Exam  Mental status: The patient is alert and oriented x 3 at the time of the examination. The patient has apparent normal recent and remote memory,  attention span and concentration ability now seem normal.   Speech is normal.     Cranial nerves: Extraocular movements are full. Facial symmetry is present. There is good facial sensation to soft touch bilaterally.Facial strength is normal.  Trapezius and sternocleidomastoid strength is normal. No dysarthria is noted.  No obvious hearing deficits are noted.  Motor:  Muscle bulk is normal.   Tone is mildly increased on the rightg. Strength is  5/5 in right arm, 5/5, left arm.   4+ right leg (4/5 EHL) and 5/5 left leg  Sensory: Sensory testing is intact to pinprick, soft touch and vibration sensation in arms but decreased vibration and touch/temp in feet below ankles  Coordination: Cerebellar testing reveals mild finger-nose-finger and RAM, worse on right.  Reduced right worse than left heel-to-shin bilaterally.  Gait and station: She has a good station. When she is up she is able to walk fairly well with a good stride using her walker..   Reflexes: Deep tendon reflexes are increased bilaterally, slightly more on the right.     Other:   She does not have any Tinel's signs at the wrists or elbows    DIAGNOSTIC DATA (LABS, IMAGING, TESTING) - I reviewed patient records, labs, notes, testing and imaging myself where available.  Lab Results  Component Value Date   WBC 4.9 05/17/2015   HGB 12.5 08/18/2014   HCT 36.4 05/17/2015   MCV 94 05/17/2015   PLT 323 05/17/2015      Component Value Date/Time   NA 139 05/17/2015 1323   NA 141 08/18/2014 0952   K 5.2 05/17/2015 1323   CL 99 05/17/2015 1323   CO2 25 05/17/2015 1323   GLUCOSE 93 05/17/2015 1323   GLUCOSE 98 08/18/2014 0952   BUN 14 05/17/2015 1323   BUN 7 08/18/2014 0952   CREATININE 0.78 05/17/2015 1323   CREATININE 0.5 05/17/2014   CREATININE 0.75 09/02/2012 1057   CALCIUM 9.2  05/17/2015 1323   PROT 7.2 05/17/2015  1323   PROT 7.3 08/20/2014 0815   ALBUMIN 4.5 05/17/2015 1323   ALBUMIN 3.9 08/20/2014 0815   AST 17 05/17/2015 1323   ALT 8 05/17/2015 1323   ALKPHOS 90 05/17/2015 1323   BILITOT <0.2 05/17/2015 1323   BILITOT 0.5 08/20/2014 0815   GFRNONAA 95 05/17/2015 1323   GFRAA 109 05/17/2015 1323   Lab Results  Component Value Date   CHOL 217* 02/09/2013   HDL 45 02/09/2013   LDLCALC 146* 02/09/2013   TRIG 130 02/09/2013   CHOLHDL 4.8 02/09/2013   No results found for: HGBA1C Lab Results  Component Value Date   VITAMINB12 348 04/04/2015   Lab Results  Component Value Date   TSH 2.871 08/21/2011       ASSESSMENT AND PLAN  Multiple sclerosis (HCC)  Ataxic gait  Depression with anxiety  High risk medication use  Other urinary incontinence   1.   Her last Novantrone was at the end of March 2017. Due to the risks of congestive heart failure and leukemia with repeated dosing of Novantrone, she wishes to change to different medication. We reviewed the pros and cons of several options that she is most interested in ocrelizumab. I will check some blood work today to rule out chronic infections and also to assess what her current lymphocyte subsets are. We discussed that I would want to see some recovery in the left facet subsets before starting ocrelizumab. 2.    She will continue to try to stay active and exercises as tolerated. RTC 3 -4 months or sooner if problems.    Richard A. Felecia Shelling, MD, PhD [id number], [id number] AM Certified in Neurology, Clinical Neurophysiology, Sleep Medicine, Pain Medicine and Neuroimaging  [facility] [address], [place],  [address] [phone]  BSA = 2.28  Dose 12 mg/M2 = 27.35

## 2015-08-03 LAB — CBC WITH DIFFERENTIAL/PLATELET
BASOS: 0 %
Basophils Absolute: 0 10*3/uL (ref 0.0–0.2)
EOS (ABSOLUTE): 0.1 10*3/uL (ref 0.0–0.4)
EOS: 2 %
HEMATOCRIT: 48.1 % — AB (ref 34.0–46.6)
HEMOGLOBIN: 16.1 g/dL — AB (ref 11.1–15.9)
IMMATURE GRANULOCYTES: 0 %
Immature Grans (Abs): 0 10*3/uL (ref 0.0–0.1)
LYMPHS ABS: 0.7 10*3/uL (ref 0.7–3.1)
Lymphs: 21 %
MCH: 31.5 pg (ref 26.6–33.0)
MCHC: 33.5 g/dL (ref 31.5–35.7)
MCV: 94 fL (ref 79–97)
MONOCYTES: 8 %
MONOS ABS: 0.3 10*3/uL (ref 0.1–0.9)
Neutrophils Absolute: 2.3 10*3/uL (ref 1.4–7.0)
Neutrophils: 69 %
Platelets: 196 10*3/uL (ref 150–379)
RBC: 5.11 x10E6/uL (ref 3.77–5.28)
RDW: 14.4 % (ref 12.3–15.4)
WBC: 3.4 10*3/uL (ref 3.4–10.8)

## 2015-08-03 LAB — HEPATITIS B SURFACE ANTIGEN: HEP B S AG: NEGATIVE

## 2015-08-03 LAB — HEPATIC FUNCTION PANEL
ALBUMIN: 4.6 g/dL (ref 3.5–5.5)
ALK PHOS: 88 IU/L (ref 39–117)
ALT: 14 IU/L (ref 0–32)
AST: 21 IU/L (ref 0–40)
BILIRUBIN, DIRECT: 0.08 mg/dL (ref 0.00–0.40)
Bilirubin Total: 0.2 mg/dL (ref 0.0–1.2)
TOTAL PROTEIN: 7.1 g/dL (ref 6.0–8.5)

## 2015-08-03 LAB — HEPATITIS B CORE ANTIBODY, TOTAL: Hep B Core Total Ab: NEGATIVE

## 2015-08-03 LAB — HEPATITIS B SURFACE ANTIBODY,QUALITATIVE: Hep B Surface Ab, Qual: NONREACTIVE

## 2015-08-06 LAB — QUANTIFERON IN TUBE
QFT TB AG MINUS NIL VALUE: 0 IU/mL
QUANTIFERON MITOGEN VALUE: 6.7 IU/mL
QUANTIFERON NIL VALUE: 0.03 [IU]/mL
QUANTIFERON TB AG VALUE: 0.03 IU/mL
QUANTIFERON TB GOLD: NEGATIVE

## 2015-08-06 LAB — QUANTIFERON TB GOLD ASSAY (BLOOD)

## 2015-08-12 ENCOUNTER — Telehealth: Payer: Self-pay | Admitting: *Deleted

## 2015-08-12 NOTE — Telephone Encounter (Signed)
Form for ocrevus signed and given to [place], intrafusion.

## 2015-08-15 ENCOUNTER — Telehealth: Payer: Self-pay | Admitting: *Deleted

## 2015-08-15 NOTE — Telephone Encounter (Signed)
Noted.  Sandy sent in [place] form/fim

## 2015-08-15 NOTE — Telephone Encounter (Signed)
-----   Message from Britt Bottom, MD sent at 08/12/2015 11:38 AM EDT ----- Her labs were fine. Lovey Newcomer was going to go ahead and send in the ocrelizumab form.

## 2015-08-16 NOTE — Telephone Encounter (Signed)
Noted/fim 

## 2015-09-07 NOTE — Telephone Encounter (Signed)
Message printed and given to Tina in the infusion suite/fim 

## 2015-09-07 NOTE — Telephone Encounter (Signed)
Per Hillery Jacks is ready to be sched. for Ocrelizumab.  When RAS returns to the office Monday, will double check with him that is ok to sched, and let Otila Kluver know.  Romina's last Novantrone was [phone]/fim

## 2015-09-07 NOTE — Telephone Encounter (Signed)
Pt called inquiring if RN has heard back regarding new medication.

## 2015-09-28 ENCOUNTER — Telehealth: Payer: Self-pay | Admitting: Neurology

## 2015-09-28 NOTE — Telephone Encounter (Signed)
Chitre/[employer] [phone] called to follow up on request for medical records (office notes from 12/25/14 to present) and disability form to be completed, Case ID# [id number], states request was faxed to [phone] on June 30th, July 10th and July 17th and hasn't heard back from our office.

## 2015-10-03 NOTE — Telephone Encounter (Signed)
April/[employer] [phone] called to check status of disability form, Chitre spoke with [place] [employer] on July 26th who advised, form was not received and recommended re-faxing form. April advised Disability form was faxed along with records request on May 17th, June 30th, July 10th and July 17th, states we did send records on July 26th, disability form has not been faxed back.

## 2015-10-05 ENCOUNTER — Telehealth: Payer: Self-pay | Admitting: *Deleted

## 2015-10-05 NOTE — Telephone Encounter (Signed)
Pt [employer] form on [employer].

## 2015-10-06 NOTE — Telephone Encounter (Signed)
Dana/[employer] [phone] ext [phone] called to confirm if doctor is placing any restrictions on patient regarding mental illness, information received indicated depression and anxiety. Please call to advise.

## 2015-10-06 NOTE — Telephone Encounter (Signed)
Attempted to reach [employer]. Message stated she is out of the office. Will try to reach tomorrow.

## 2015-10-06 NOTE — Telephone Encounter (Signed)
She has some cognitive issues from the [place] but her disability is predominantly physical.  We can get formal neurocognitive testing if her insurance company needs this.  Depression has been better past few months. She still has some anxiety.

## 2015-10-07 NOTE — Telephone Encounter (Signed)
LVM for [facility] Dyer, advised the phones are off at noon today, however this RN has a message for her if she returns the call today. Advised if she calls back Monday, ask to speak with Dr Garth Bigness RN, Faith. Left office's number.

## 2015-10-10 NOTE — Telephone Encounter (Signed)
Forms were received from [place] on 09-28-15--were completed and faxed back.  I have spoken with Christella Scheuermann rep (unable to understand name--some language barrier), and re-faxed forms to them at fax# [phone], with fax confirmation received/fim

## 2015-10-17 ENCOUNTER — Telehealth: Payer: Self-pay | Admitting: Neurology

## 2015-10-17 NOTE — Telephone Encounter (Signed)
I have spoken with Ann Oconnor this afternoon.  She has had both part A and part B of her first Ocrelizumab infusion.  She c/o h/a's every afternoon since--appt. given with RAS tomorrow afternoon/fim

## 2015-10-17 NOTE — Telephone Encounter (Signed)
Pt [employer] form faxed to [phone] on 10/17/2015.

## 2015-10-17 NOTE — Telephone Encounter (Signed)
Patient called to advise, she has headaches everyday, every afternoon since last infusion treatment. Please call [phone].

## 2015-10-18 ENCOUNTER — Ambulatory Visit (INDEPENDENT_AMBULATORY_CARE_PROVIDER_SITE_OTHER): Payer: BLUE CROSS/BLUE SHIELD | Admitting: Neurology

## 2015-10-18 ENCOUNTER — Encounter: Payer: Self-pay | Admitting: Neurology

## 2015-10-18 VITALS — BP 114/66 | HR 68 | Resp 16 | Ht 64.0 in | Wt 219.0 lb

## 2015-10-18 DIAGNOSIS — M5481 Occipital neuralgia: Secondary | ICD-10-CM

## 2015-10-18 DIAGNOSIS — G35 Multiple sclerosis: Secondary | ICD-10-CM

## 2015-10-18 DIAGNOSIS — R260 Ataxic gait: Secondary | ICD-10-CM

## 2015-10-18 DIAGNOSIS — M542 Cervicalgia: Secondary | ICD-10-CM | POA: Diagnosis not present

## 2015-10-18 DIAGNOSIS — Z79899 Other long term (current) drug therapy: Secondary | ICD-10-CM

## 2015-10-18 DIAGNOSIS — R208 Other disturbances of skin sensation: Secondary | ICD-10-CM | POA: Diagnosis not present

## 2015-10-18 DIAGNOSIS — R200 Anesthesia of skin: Secondary | ICD-10-CM

## 2015-10-18 NOTE — Progress Notes (Signed)
[facility]  PATIENT: Ann Oconnor DOB: 06-16-1975  REFERRING DOCTOR OR PCP:  Delia Chimes SOURCE: Patient, records, images  _________________________________   HISTORICAL  CHIEF COMPLAINT:  Chief Complaint  Patient presents with  . Multiple Sclerosis    Sts. since 2nd Ocrelizumab 300mg  infuusion 2 weeks ago, she's had a dull h/a every afternoon.  H/A is relieved with Tylenol/fim    HISTORY OF PRESENT ILLNESS:  Ann Oconnor is a 39 yo woman with a very aggressive MS.    Her ocrelizumab infusions were 4 and 2 weeks ago.   She tolerated the infusion well but had a HA since the second infusion.   HA is occipital .  Nothing really makes it worse.   Tylenol helps for a few hours.     Gait/strength/Numbness:  She is able to walk a mile now without a cane.  She can climb a short flight of stairs.   She is driving now short distances.  She feels weakness is very mild now but she still has left > right spasticity.   Gait issues are due more to balance than strength.      She is having numbness in her left hand and both feet, similar to last visit.   Numbness is not complete as it was but is still severe. She is waling without a cane now and stumbles but has not fallen.        Hand numbness involves the entire left.   She had normal NCV.    Numbness is slightly tingling but not painful.   She is on gabapentin 400 mg twice a day with benefit   Bladder/bowel:   She reports no incontinence since last visit.     No significant frequency.    Vision:  She denies MS related visual issues.  Fatigue/sleep:  She has mild fatigue in the morning but more fatigue in the afternoons.    She is able to sleep fairly well at night.  Mood/cognition:   Mood is much improved since earlier this year.   Now denies depression and only mild anxiety.     She remains on Paxil and has been on that for many years.   Her cognitive issues have returned close to baseline.  She notes some verbal  fluency issues.    MS:    She presented in 2002 with numbness in her legs and poor gait.  The MRI scan showed MS plaques in the spinal cord and the brain and she was diagnosed with multiple sclerosis. She was initially placed on Rebif. At first she did well with only a couple of small central exacerbations. However, 2003 she had a more severe exacerbation that affected her gait. She was placed on a combination Rebif and Copaxone for a short period of time and then was on Copaxone monotherapy. She switched to [place] in 2011 due to needle fatigue. She had numbness in her feet that would not go away and was switched to Tecfidera from [facility] in November 2015.  In January 2016, she lost strength in both legs and urinary incontinence. She had a course of IV steroids without any benefit in late January. Because there was no benefit, she was admitted to [facility] for a course of plasmapheresis starting February 5. She got no benefit from the plasmapheresis. During the next week, she was home but she was doing worse and worse. She then presented to [facility]. She was admitted for another week of steroids. An  MRI done at admission showed very aggressive MS changes.    Brain 04/28/2014 shows multiple infratentorial plaques including left greater than right middle cerebellar peduncles, bilateral pons, and cerebellum. Additionally there are multiple foci in the hemispheres, some with a concentric demyelinating pattern that could be consistent with Balo's concentric sclerosis, an especially fulminant form of multiple sclerosis. She was unable to complete the examination or get contrast due to the need to terminate the study early.   When compared to an MRI dated 02/07/2012, there has been dramatic change with multiple new brain stem and hemispheric foci.  MRI of the cervical spine 10/26/2010. showed multiple plaques:  right at the C2 level, left aspect C2-3 level, right aspect C3 level, the posterior central aspect C3  level, upper C6 level slightly greater to the right and C7 level greater to the left.  Abnormal signal within the left aspect of the cord at the T3- T4 level.   MRI of the brain in 05/19/2014 showed several additional posterior fossa lesions prompting initiation of Novantrone.  Marland Kitchen     REVIEW OF SYSTEMS: Constitutional: No fevers, chills, sweats, or change in appetite.  Fatigue Eyes: No visual changes, double vision, eye pain Ear, nose and throat: No hearing loss, ear pain, nasal congestion, sore throat Cardiovascular: No chest pain, palpitations Respiratory: No shortness of breath at rest or with exertion.   No wheezes GastrointestinaI: No nausea, vomiting.  Some fecal incontinence Genitourinary: as above. Musculoskeletal: No neck pain, back pain Integumentary: No rash, pruritus, skin lesions Neurological: as above Psychiatric: Notes depression at this time.  Some anxiety Endocrine: No palpitations, diaphoresis, change in appetite, change in weigh or increased thirst Hematologic/Lymphatic: No anemia, purpura, petechiae. Allergic/Immunologic: No itchy/runny eyes, nasal congestion, recent allergic reactions, rashes  ALLERGIES: No Known Allergies  HOME MEDICATIONS:  Current Outpatient Prescriptions:  .  gabapentin (NEURONTIN) 400 MG capsule, TAKE 1 CAPSULE(400 MG) BY MOUTH TWICE DAILY, Disp: 60 capsule, Rfl: 11 .  PARoxetine (PAXIL-CR) 37.5 MG 24 hr tablet, TAKE 1 TABLET(37.5 MG) BY MOUTH DAILY, Disp: 30 tablet, Rfl: 11 .  rosuvastatin (CRESTOR) 10 MG tablet, , Disp: , Rfl: 5 No current facility-administered medications for this visit.   Facility-Administered Medications Ordered in Other Visits:  .  gadopentetate dimeglumine (MAGNEVIST) injection 20 mL, 20 mL, Intravenous, Once PRN, Britt Bottom, MD  PAST MEDICAL HISTORY: Past Medical History:  Diagnosis Date  . Anxiety   . Family history of adverse reaction to anesthesia    Father - difficulty awaken- has sleep apena  . Gait  instability   . Head injury, closed, with brief LOC ([place]) 2003   fall  . Incontinence   . 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  Colpo normal with neg ECC  . Movement disorder   . MS (multiple sclerosis) ([place]) 2002  . Seizures ([place]) 2003  . UTI (lower urinary tract infection)   . Vision abnormalities     PAST SURGICAL HISTORY: Past Surgical History:  Procedure Laterality Date  . CERVICAL CONE BIOPSY  2008   CIN 2  . PORTACATH PLACEMENT Left 08/18/14  . PORTACATH PLACEMENT Left 08/18/2014   Procedure: LEFT SUBCLAVIAN VEIN PORT PLACEMENT;  Surgeon: Donnie Mesa, MD;  Location: [facility] OR;  Service: General;  Laterality: Left;    FAMILY HISTORY: Family History  Problem Relation Age of Onset  . Thyroid disease Mother   . Thyroid disease Sister   . Cancer Paternal Aunt  not sure what kind, maybe cervix  . Thyroid disease Sister   . Heart disease Father   . Mitral valve prolapse Father     SOCIAL HISTORY:  Social History   Social History  . Marital status: Single    Spouse name: N/A  . Number of children: N/A  . Years of education: N/A   Occupational History  . Not on file.   Social History Main Topics  . Smoking status: Never Smoker  . Smokeless tobacco: Never Used  . Alcohol use No  . Drug use: No  . Sexual activity: No   Other Topics Concern  . Not on file   Social History Narrative  . No narrative on file     PHYSICAL EXAM  Vitals:   10/18/15 1444  BP: 114/66  Pulse: 68  Resp: 16  Weight: 219 lb (99.3 kg)  Height: 5\' 4"  (1.626 m)    Body mass index is 37.59 kg/m.   General: The patient is well-developed and well-nourished and in no acute distress   Neurologic Exam  Mental status: The patient is alert and oriented x 3 at the time of the examination. The patient has apparent normal recent and remote memory,  attention span and concentration ability now seem normal.   Speech is  normal.     Cranial nerves: Extraocular movements are full. Facial symmetry is present. There is good facial sensation to soft touch bilaterally.Facial strength is normal.  Trapezius and sternocleidomastoid strength is normal. No dysarthria is noted.  No obvious hearing deficits are noted.  Motor:  Muscle bulk is normal.   Tone is mildly increased in legs.. Strength is  5/5 in arms and legs now.  Sensory: Sensory testing is intact to pinprick, soft touch and vibration sensation in arms but decreased vibration and touch/temp in feet below ankles  Coordination: Cerebellar testing reveals mild reduced right finger-nose-finger and RAM.  Reduced right worse than left heel-to-shin bilaterally.  Gait and station: She has a normal station. Gait shows mild left foot drop and slightly reduced stride.      Reflexes: Deep tendon reflexes are increased bilaterally, slightly more on the right.        DIAGNOSTIC DATA (LABS, IMAGING, TESTING) - I reviewed patient records, labs, notes, testing and imaging myself where available.  Lab Results  Component Value Date   WBC 3.4 08/02/2015   HGB 12.5 08/18/2014   HCT 48.1 (H) 08/02/2015   MCV 94 08/02/2015   PLT 196 08/02/2015      Component Value Date/Time   NA 139 05/17/2015 1323   K 5.2 05/17/2015 1323   CL 99 05/17/2015 1323   CO2 25 05/17/2015 1323   GLUCOSE 93 05/17/2015 1323   GLUCOSE 98 08/18/2014 0952   BUN 14 05/17/2015 1323   CREATININE 0.78 05/17/2015 1323   CREATININE 0.75 09/02/2012 1057   CALCIUM 9.2 05/17/2015 1323   PROT 7.1 08/02/2015 1049   ALBUMIN 4.6 08/02/2015 1049   AST 21 08/02/2015 1049   ALT 14 08/02/2015 1049   ALKPHOS 88 08/02/2015 1049   BILITOT 0.2 08/02/2015 1049   GFRNONAA 95 05/17/2015 1323   GFRAA 109 05/17/2015 1323   Lab Results  Component Value Date   CHOL 217 (H) 02/09/2013   HDL 45 02/09/2013   LDLCALC 146 (H) 02/09/2013   TRIG 130 02/09/2013   CHOLHDL 4.8 02/09/2013   No results found for:  HGBA1C Lab Results  Component Value Date   VITAMINB12 348 04/04/2015  Lab Results  Component Value Date   TSH 2.871 08/21/2011       ASSESSMENT AND PLAN  Relapsing remitting multiple sclerosis (HCC)  Numbness of both lower extremities  High risk medication use  Ataxic gait  Neck pain  Occipital neuralgia of left side   1.   She received her ocrelizumab initial doses and next injection will be in another 5 months. 2.    Left splenius capitis injection with Depo-Medrol in 3 mL Marcaine. She tolerated the procedure well. There were no complications.  3.   She will continue to try to stay active and exercises as tolerated. RTC 5-6 months or sooner if problems.    Richard A. Felecia Shelling, MD, PhD [id number], [id number] PM Certified in Neurology, Clinical Neurophysiology, Sleep Medicine, Pain Medicine and Neuroimaging  [facility] [address], [place], [place] [address] [phone]  BSA = 2.28  Dose 12 mg/M2 = 27.35

## 2015-11-28 ENCOUNTER — Telehealth: Payer: Self-pay | Admitting: Neurology

## 2015-11-28 NOTE — Telephone Encounter (Signed)
Pt called said she is having excruciating back pain that started yesterday. She said the only thing that helps somewhat is aleve. She is wanting to know if this could be MS

## 2015-11-28 NOTE — Telephone Encounter (Signed)
I have spoken with Ann Oconnor this afternoon.  She sts. she had sudden onset yesterday of non- radiating lbp, relieved with Advil. No known injury. Some pain with walking, o/w gait/balance are not affected.  I have explained that as pain is better with nsaid, this may be a separate musculoskeletal issue-ie overused or strained muscle, disc problem, not necessarily her MS.  I have offered appt. with RAS, but she prefers to wait a few days to see if pain resolves.  If it persists or worsens, or she develops new sx., she will call back/fim

## 2016-01-30 ENCOUNTER — Telehealth: Payer: Self-pay | Admitting: Neurology

## 2016-01-30 ENCOUNTER — Encounter: Payer: Self-pay | Admitting: Rehabilitation

## 2016-01-30 DIAGNOSIS — G35 Multiple sclerosis: Secondary | ICD-10-CM

## 2016-01-30 DIAGNOSIS — Z79899 Other long term (current) drug therapy: Secondary | ICD-10-CM

## 2016-01-30 NOTE — Telephone Encounter (Signed)
I have spoken with Ann Oconnor this afternoon.  She sts. she would like PAC removed. "I just don't like having it in my body."  She is on Ocrelizumab now, so only needs 2 IV's per yr. Per RAS, ok for PAC removal.  Referral to gen. surg. for pac removal placed in EPIC.  LMOM to let pt. know referral has been made./fim

## 2016-01-30 NOTE — Telephone Encounter (Signed)
Patient is calling to discuss having her porta catheter removed.

## 2016-01-30 NOTE — Therapy (Signed)
[facility] [address] [place], [place], [address] Phone: [phone]   Fax:  [phone]  Patient Details  Name: Ann Oconnor MRN: [id number] Date of Birth: April 24, 1975 Referring Provider:  No ref. provider found  Encounter Date: 01/30/2016   PHYSICAL THERAPY DISCHARGE SUMMARY  Visits from Start of Care: 3  Current functional level related to goals / functional outcomes: Pt did not return to therapy, was limited by visit limitations.    Remaining deficits:     PT Long Term Goals - 02/08/15 1028      PT LONG TERM GOAL #1   Title Pt will be independent with HEP for BLE strengthening and balance to indicate improved functional mobility and decreased fall risk. (Target Date: 03/02/15)     PT LONG TERM GOAL #2   Title Pt will increase BERG score to 45/56 in order to indicate functional improvement in balance and decreased fall risk. (Target Date: 03/02/15)     PT LONG TERM GOAL #3   Title Pt will negotiate on paved outdoor surfaces with SBQC at mod I level in order to indicate improved functional mobility and increased independence. (Target Date: 03/02/15)     PT LONG TERM GOAL #4   Title Pt will report 3 ways to conserve energy with ADL's at home and in community.  (Target Date: 03/02/15)        Education / Equipment: See PT notes  Plan: Patient agrees to discharge.  Patient goals were not met. Patient is being discharged due to not returning since the last visit.  ?????        Cameron Sprang, PT, MPT [facility] [address] [place], [place], [address] Phone: [phone]   Fax:  [phone] 01/30/16, 1:55 PM

## 2016-01-30 NOTE — Addendum Note (Signed)
Addended by: France Ravens I on: 01/30/2016 04:13 PM   Modules accepted: Orders

## 2016-02-03 NOTE — Telephone Encounter (Signed)
Pt. has an appt. with Dr. [name] at [facility], on 02-13-16, arrival time of 0920 for a 0950 appt.  Pt. is aware/fim

## 2016-02-10 ENCOUNTER — Encounter: Payer: Self-pay | Admitting: Women's Health

## 2016-02-10 ENCOUNTER — Ambulatory Visit (INDEPENDENT_AMBULATORY_CARE_PROVIDER_SITE_OTHER): Payer: BLUE CROSS/BLUE SHIELD | Admitting: Women's Health

## 2016-02-10 VITALS — BP 132/82 | Ht 64.0 in | Wt 225.0 lb

## 2016-02-10 DIAGNOSIS — Z01419 Encounter for gynecological examination (general) (routine) without abnormal findings: Secondary | ICD-10-CM | POA: Diagnosis not present

## 2016-02-10 NOTE — Addendum Note (Signed)
Addended by: Burnett Kanaris on: 02/10/2016 11:00 AM   Modules accepted: Orders

## 2016-02-10 NOTE — Patient Instructions (Signed)

## 2016-02-10 NOTE — Progress Notes (Signed)
[place] 1976-01-26 [id number]    History:    Presents for annual exam.  Postmenopausal on no HRT with no bleeding. History of LGSIL with +18/45 high risk HPV with negative colposcopy. Normal Pap history. MS started chemotherapy 06/2014 and became amenorrheic May 2016 elevated FSH. Currently on an every six-month infusion for MS and had is doing much better. One year ago was walking with a walker with difficulty due to leg weakness and numbness and is now using a cane occasionally. Not sexually active greater than 5 years.  Past medical history, past surgical history, family history and social history were all reviewed and documented in the EPIC chart. On disability, parents live close by.  ROS:  A ROS was performed and pertinent positives and negatives are included.  Exam:  Vitals:   02/10/16 0954  BP: 132/82  Weight: 225 lb (102.1 kg)  Height: 5\' 4"  (1.626 m)   Body mass index is 38.62 kg/m.   General appearance:  Normal Thyroid:  Symmetrical, normal in size, without palpable masses or nodularity. Respiratory  Auscultation:  Clear without wheezing or rhonchi Cardiovascular  Auscultation:  Regular rate, without rubs, murmurs or gallops  Edema/varicosities:  Not grossly evident Abdominal  Soft,nontender, without masses, guarding or rebound.  Liver/spleen:  No organomegaly noted  Hernia:  None appreciated  Skin  Inspection:  Grossly normal   Breasts: Examined lying and sitting.     Right: Without masses, retractions, discharge or axillary adenopathy.     Left: Without masses, retractions, discharge or axillary adenopathy. Gentitourinary   Inguinal/mons:  Normal without inguinal adenopathy  External genitalia:  Normal  BUS/Urethra/Skene's glands:  Normal  Vagina:  Normal  Cervix:  Normal  Uterus:  normal in size, shape and contour.  Midline and mobile  Adnexa/parametria:     Rt: Without masses or tenderness.   Lt: Without masses or tenderness.  Anus and  perineum: Normal  Digital rectal exam: Normal sphincter tone without palpated masses or tenderness  Assessment/Plan:  40 y.o. WF G0 for annual exam.    Postmenopausal/no HRT/no bleeding MS on every six-month infusion per neurologist History of seizures Hypercholesteremia-primary care manages labs and meds LGSIL history Obesity  Plan: Pap with HR HPV typing. SBE's, continue annual screening mammogram, 3-D tomography reviewed and encouraged history of dense breasts, calcium rich diet, MVI daily encouraged. Exercise as able, home safety and fall prevention discussed. Encouraged to decrease calories for wt loss.  Planning to have infusion port remove this month.    [facility], 10:28 AM 02/10/2016

## 2016-02-13 ENCOUNTER — Ambulatory Visit: Payer: Self-pay | Admitting: Surgery

## 2016-02-13 NOTE — H&P (Signed)
History of Present Illness Ann Oconnor. Reilyn Nelson MD; 02/13/2016 10:16 AM) The patient is a 40 year old female who presents to discuss consultation. s/p left subclavian vein port placement 08/18/14 for infusion therapy for multiple sclerosis and poor IV access. The patient has been transitioned to a medication that all and requires 1 infusion every 6 months. Her infusion nurse feels that she is suitable for peripheral IV access and no longer needs her port. Overall the patient feels much better. She is much more active and feels that the infusion is helping significantly.   Allergies (Sonya Bynum, CMA; 02/13/2016 9:31 AM) No Known Drug Allergies 08/16/2014  Medication History (Sonya Bynum, CMA; 02/13/2016 9:31 AM) Baclofen (10MG  Tablet, Oral) Active. Gabapentin (400MG  Capsule, Oral) Active. ClonazePAM (0.5MG  Tablet, Oral) Active. PARoxetine HCl ER (37.5MG  Tablet ER 24HR, Oral) Active. Medications Reconciled    Vitals (Sonya Bynum CMA; 02/13/2016 9:31 AM) 02/13/2016 9:31 AM Weight: 225 lb Height: 64in Body Surface Area: 2.06 m Body Mass Index: 38.62 kg/m  Temp.: 62F(Temporal)  Pulse: 75 (Regular)  BP: 130/70 (Sitting, Left Arm, Standard)      Physical Exam Rodman Key K. Cullan Launer MD; 02/13/2016 10:17 AM)  The physical exam findings are as follows: Note:WDWN in NAD Eyes: Pupils equal, round; sclera anicteric HENT: Oral mucosa moist; good dentition Neck: No masses palpated, no thyromegaly Chest - left subclavian port site - well-healed; no seroma or infection Lungs: CTA bilaterally; normal respiratory effort CV: Regular rate and rhythm; no murmurs; extremities well-perfused with no edema Abd: +bowel sounds, soft, non-tender, no palpable organomegaly; no palpable hernias Skin: Warm, dry; no sign of jaundice Psychiatric - alert and oriented x 4; calm mood and affect    Assessment & Plan Rodman Key K. Kacey Dysert MD; 02/13/2016 10:04 AM)  MULTIPLE SCLEROSIS  (G35)  DIFFICULT INTRAVENOUS ACCESS (Z78.9)  Current Plans Schedule for Surgery - Port removal. The surgical procedure has been discussed with the patient. Potential risks, benefits, alternative treatments, and expected outcomes   Ann Oconnor. [name], MD, [facility]  General/ Trauma Surgery  02/13/2016 10:18 AM

## 2016-02-14 LAB — PAP IG W/ RFLX HPV ASCU

## 2016-02-15 ENCOUNTER — Encounter (HOSPITAL_COMMUNITY): Payer: Self-pay | Admitting: *Deleted

## 2016-02-16 ENCOUNTER — Ambulatory Visit (HOSPITAL_COMMUNITY): Payer: BLUE CROSS/BLUE SHIELD | Admitting: Certified Registered Nurse Anesthetist

## 2016-02-16 ENCOUNTER — Encounter (HOSPITAL_COMMUNITY)
Admission: RE | Disposition: A | Discharge status: Home / Self Care | Payer: Self-pay | Source: Ambulatory Visit | Attending: Surgery

## 2016-02-16 ENCOUNTER — Encounter (HOSPITAL_COMMUNITY): Payer: Self-pay | Admitting: Urology

## 2016-02-16 ENCOUNTER — Ambulatory Visit (HOSPITAL_COMMUNITY)
Admission: RE | Admit: 2016-02-16 | Discharge: 2016-02-16 | Disposition: A | Discharge status: Home / Self Care | Payer: BLUE CROSS/BLUE SHIELD | Source: Ambulatory Visit | Attending: Surgery | Admitting: Surgery

## 2016-02-16 DIAGNOSIS — F419 Anxiety disorder, unspecified: Secondary | ICD-10-CM | POA: Diagnosis not present

## 2016-02-16 DIAGNOSIS — Z452 Encounter for adjustment and management of vascular access device: Secondary | ICD-10-CM | POA: Insufficient documentation

## 2016-02-16 DIAGNOSIS — Z6838 Body mass index (BMI) 38.0-38.9, adult: Secondary | ICD-10-CM | POA: Insufficient documentation

## 2016-02-16 DIAGNOSIS — G35 Multiple sclerosis: Secondary | ICD-10-CM | POA: Insufficient documentation

## 2016-02-16 HISTORY — PX: PORT-A-CATH REMOVAL: SHX5289

## 2016-02-16 LAB — CBC
HCT: 39.4 % (ref 36.0–46.0)
HEMOGLOBIN: 12.8 g/dL (ref 12.0–15.0)
MCH: 30.9 pg (ref 26.0–34.0)
MCHC: 32.5 g/dL (ref 30.0–36.0)
MCV: 95.2 fL (ref 78.0–100.0)
PLATELETS: 272 10*3/uL (ref 150–400)
RBC: 4.14 MIL/uL (ref 3.87–5.11)
RDW: 13 % (ref 11.5–15.5)
WBC: 4.7 10*3/uL (ref 4.0–10.5)

## 2016-02-16 LAB — BASIC METABOLIC PANEL
Anion gap: 7 (ref 5–15)
BUN: 10 mg/dL (ref 6–20)
CALCIUM: 9.4 mg/dL (ref 8.9–10.3)
CHLORIDE: 106 mmol/L (ref 101–111)
CO2: 26 mmol/L (ref 22–32)
CREATININE: 0.82 mg/dL (ref 0.44–1.00)
GFR calc non Af Amer: >60 mL/min (ref >60)
Glucose, Bld: 86 mg/dL (ref 65–99)
Potassium: 3.9 mmol/L (ref 3.5–5.1)
SODIUM: 139 mmol/L (ref 135–145)

## 2016-02-16 SURGERY — REMOVAL PORT-A-CATH
Anesthesia: Monitor Anesthesia Care | Site: Neck

## 2016-02-16 MED ORDER — OXYCODONE HCL 5 MG/5ML PO SOLN: 5.0000 mg | Freq: Once | ORAL | Status: DC | PRN

## 2016-02-16 MED ORDER — HYDROCODONE-ACETAMINOPHEN 5-325 MG PO TABS: 1.0000 | ORAL_TABLET | Freq: Four times a day (QID) | ORAL | 0 refills | Status: DC | PRN

## 2016-02-16 MED ORDER — CEFAZOLIN SODIUM-DEXTROSE 2-4 GM/100ML-% IV SOLN
2.0000 g | INTRAVENOUS | Status: AC
  Administered 2016-02-16: 2 g via INTRAVENOUS

## 2016-02-16 MED ORDER — CHLORHEXIDINE GLUCONATE CLOTH 2 % EX PADS: 6.0000 | MEDICATED_PAD | Freq: Once | CUTANEOUS | Status: DC

## 2016-02-16 MED ORDER — MIDAZOLAM HCL 5 MG/5ML IJ SOLN
INTRAMUSCULAR | Status: DC | PRN
  Administered 2016-02-16 (×2): 1 mg via INTRAVENOUS

## 2016-02-16 MED ORDER — OXYCODONE HCL 5 MG PO TABS: 5.0000 mg | ORAL_TABLET | Freq: Once | ORAL | Status: DC | PRN

## 2016-02-16 MED ORDER — BUPIVACAINE-EPINEPHRINE (PF) 0.25% -1:200000 IJ SOLN
INTRAMUSCULAR | Status: AC
  Filled 2016-02-16: qty 30

## 2016-02-16 MED ORDER — FENTANYL CITRATE (PF) 100 MCG/2ML IJ SOLN
INTRAMUSCULAR | Status: DC | PRN
Start: 2016-02-16 — End: 2016-02-16
  Administered 2016-02-16 (×4): 25 ug via INTRAVENOUS

## 2016-02-16 MED ORDER — FENTANYL CITRATE (PF) 100 MCG/2ML IJ SOLN
INTRAMUSCULAR | Status: AC
  Filled 2016-02-16: qty 2

## 2016-02-16 MED ORDER — ONDANSETRON HCL 4 MG/2ML IJ SOLN
INTRAMUSCULAR | Status: DC | PRN
  Administered 2016-02-16: 4 mg via INTRAVENOUS

## 2016-02-16 MED ORDER — FENTANYL CITRATE (PF) 100 MCG/2ML IJ SOLN: 25.0000 ug | INTRAMUSCULAR | Status: DC | PRN

## 2016-02-16 MED ORDER — LACTATED RINGERS IV SOLN
INTRAVENOUS | Status: DC
  Administered 2016-02-16: 09:00:00 via INTRAVENOUS

## 2016-02-16 MED ORDER — 0.9 % SODIUM CHLORIDE (POUR BTL) OPTIME
TOPICAL | Status: DC | PRN
  Administered 2016-02-16: 1000 mL

## 2016-02-16 MED ORDER — BUPIVACAINE-EPINEPHRINE 0.25% -1:200000 IJ SOLN
INTRAMUSCULAR | Status: DC | PRN
  Administered 2016-02-16: 30 mL

## 2016-02-16 MED ORDER — PROPOFOL 500 MG/50ML IV EMUL
INTRAVENOUS | Status: DC | PRN
  Administered 2016-02-16: 100 ug/kg/min via INTRAVENOUS

## 2016-02-16 MED ORDER — MIDAZOLAM HCL 2 MG/2ML IJ SOLN
INTRAMUSCULAR | Status: AC
  Filled 2016-02-16: qty 2

## 2016-02-16 MED ORDER — PROPOFOL 10 MG/ML IV BOLUS
INTRAVENOUS | Status: DC | PRN
  Administered 2016-02-16: 20 mg via INTRAVENOUS

## 2016-02-16 MED ORDER — CEFAZOLIN SODIUM-DEXTROSE 2-4 GM/100ML-% IV SOLN
INTRAVENOUS | Status: AC
  Filled 2016-02-16: qty 100

## 2016-02-16 SURGICAL SUPPLY — 38 items
APL SKNCLS STERI-STRIP NONHPOA (GAUZE/BANDAGES/DRESSINGS) ×1
BENZOIN TINCTURE PRP APPL 2/3 (GAUZE/BANDAGES/DRESSINGS) ×3 IMPLANT
CHLORAPREP W/TINT 10.5 ML (MISCELLANEOUS) ×3 IMPLANT
CLOSURE WOUND 1/2 X4 (GAUZE/BANDAGES/DRESSINGS) ×1
COVER SURGICAL LIGHT HANDLE (MISCELLANEOUS) ×3 IMPLANT
DECANTER SPIKE VIAL GLASS SM (MISCELLANEOUS) ×3 IMPLANT
DRAPE LAPAROTOMY T 102X78X121 (DRAPES) ×3 IMPLANT
DRAPE UTILITY XL STRL (DRAPES) ×6 IMPLANT
DRSG TEGADERM 4X4.75 (GAUZE/BANDAGES/DRESSINGS) ×2 IMPLANT
ELECT REM PT RETURN 9FT ADLT (ELECTROSURGICAL) ×3
ELECTRODE REM PT RTRN 9FT ADLT (ELECTROSURGICAL) ×1 IMPLANT
GAUZE SPONGE 2X2 8PLY STRL LF (GAUZE/BANDAGES/DRESSINGS) ×1 IMPLANT
GLOVE BIO SURGEON STRL SZ7 (GLOVE) ×3 IMPLANT
GLOVE BIOGEL PI IND STRL 6.5 (GLOVE) IMPLANT
GLOVE BIOGEL PI IND STRL 7.5 (GLOVE) ×1 IMPLANT
GLOVE BIOGEL PI INDICATOR 6.5 (GLOVE) ×2
GLOVE BIOGEL PI INDICATOR 7.5 (GLOVE) ×2
GLOVE ECLIPSE 6.5 STRL STRAW (GLOVE) ×2 IMPLANT
GLOVE SKINSENSE NS SZ6.5 (GLOVE) ×2
GLOVE SKINSENSE STRL SZ6.5 (GLOVE) IMPLANT
GOWN BRE IMP SLV AUR LG STRL (GOWN DISPOSABLE) ×2 IMPLANT
GOWN STRL REUS W/ TWL LRG LVL3 (GOWN DISPOSABLE) ×2 IMPLANT
GOWN STRL REUS W/TWL LRG LVL3 (GOWN DISPOSABLE) ×6
KIT BASIN OR (CUSTOM PROCEDURE TRAY) ×3 IMPLANT
KIT ROOM TURNOVER OR (KITS) ×3 IMPLANT
NDL HYPO 25X1 1.5 SAFETY (NEEDLE) ×1 IMPLANT
NEEDLE HYPO 25X1 1.5 SAFETY (NEEDLE) ×3 IMPLANT
NS IRRIG 1000ML POUR BTL (IV SOLUTION) ×3 IMPLANT
PACK GENERAL/GYN (CUSTOM PROCEDURE TRAY) ×3 IMPLANT
PAD ARMBOARD 7.5X6 YLW CONV (MISCELLANEOUS) ×6 IMPLANT
SPONGE GAUZE 2X2 STER 10/PKG (GAUZE/BANDAGES/DRESSINGS) ×2
STRIP CLOSURE SKIN 1/2X4 (GAUZE/BANDAGES/DRESSINGS) ×2 IMPLANT
SUT MON AB 4-0 PC3 18 (SUTURE) ×3 IMPLANT
SUT VIC AB 3-0 SH 27 (SUTURE) ×3
SUT VIC AB 3-0 SH 27X BRD (SUTURE) ×1 IMPLANT
SYR CONTROL 10ML LL (SYRINGE) ×3 IMPLANT
TOWEL OR 17X24 6PK STRL BLUE (TOWEL DISPOSABLE) ×3 IMPLANT
TOWEL OR 17X26 10 PK STRL BLUE (TOWEL DISPOSABLE) ×3 IMPLANT

## 2016-02-16 NOTE — Discharge Instructions (Signed)
°  PORT-A-CATH REMOVAL: POST OP INSTRUCTIONS  Always review your discharge instruction sheet given to you by the facility where your surgery was performed.   1. A prescription for pain medication may be given to you upon discharge. Take your pain medication as prescribed, if needed. If narcotic pain medicine is not needed, then you make take acetaminophen (Tylenol) or ibuprofen (Advil) as needed.  2. Take your usually prescribed medications unless otherwise directed. 3. You should follow a light diet for the remainder of the day after your procedure. 4. Most patients will experience some mild swelling and/or bruising in the area of the incision. It may take several days to resolve. 5. It is common to experience some constipation if taking pain medication after surgery. Increasing fluid intake and taking a stool softener (such as Colace) will usually help or prevent this problem from occurring. A mild laxative (Milk of Magnesia or Miralax) should be taken according to package directions if there are no bowel movements after 48 hours.  6. Unless discharge instructions indicate otherwise, you may remove your bandages 48 hours after surgery, and you may shower at that time. You may have steri-strips (small white skin tapes) in place directly over the incision.  These strips should be left on the skin for 7-10 days.  7. ACTIVITIES:  Limit activity involving your arms for the next 72 hours. Do no strenuous exercise or activity for 1 week. You may drive when you are no longer taking prescription pain medication, you can comfortably wear a seatbelt, and you can maneuver your car.  WHEN TO CALL YOUR DOCTOR [phone]): 1. Fever over 101.0 2. Chills 3. Continued bleeding from incision 4. Increased redness and tenderness at the site 5. Shortness of breath, difficulty breathing   The clinic staff is available to answer your questions during regular business hours. Please dont hesitate to call and ask to  speak to one of the nurses or medical assistants for clinical concerns. If you have a medical emergency, go to the nearest emergency room or call 911.  A surgeon from [facility] is always on call at the hospital.     For further information, please visit [email or web address]

## 2016-02-16 NOTE — Anesthesia Preprocedure Evaluation (Signed)
Anesthesia Evaluation  Patient identified by MRN, date of birth, ID band Patient awake    Reviewed: Allergy & Precautions, NPO status , Patient's Chart, lab work & pertinent test results  History of Anesthesia Complications Negative for: history of anesthetic complications  Airway Mallampati: II  TM Distance: >3 FB Neck ROM: Full    Dental  (+) Teeth Intact   Pulmonary    breath sounds clear to auscultation       Cardiovascular negative cardio ROS   Rhythm:Regular     Neuro/Psych Seizures -, Well Controlled,  Anxiety MS with numbness and weakness in right leg, wheelchair bound    GI/Hepatic negative GI ROS, Neg liver ROS,   Endo/Other  Morbid obesity  Renal/GU negative Renal ROS     Musculoskeletal negative musculoskeletal ROS (+)   Abdominal   Peds  Hematology negative hematology ROS (+)   Anesthesia Other Findings   Reproductive/Obstetrics                             Anesthesia Physical Anesthesia Plan  ASA: II  Anesthesia Plan: MAC   Post-op Pain Management:    Induction: Intravenous  Airway Management Planned: Natural Airway, Nasal Cannula and Simple Face Mask  Additional Equipment: None  Intra-op Plan:   Post-operative Plan:   Informed Consent: I have reviewed the patients History and Physical, chart, labs and discussed the procedure including the risks, benefits and alternatives for the proposed anesthesia with the patient or authorized representative who has indicated his/her understanding and acceptance.   Dental advisory given  Plan Discussed with: CRNA and Surgeon  Anesthesia Plan Comments:         Anesthesia Quick Evaluation

## 2016-02-16 NOTE — Interval H&P Note (Signed)
History and Physical Interval Note:  02/16/2016 9:20 AM  Ann Oconnor  has presented today for surgery, with the diagnosis of MULTIPLE SCLEROSIS  The various methods of treatment have been discussed with the patient and family. After consideration of risks, benefits and other options for treatment, the patient has consented to  Procedure(s): REMOVAL PORT-A-CATH (N/A) as a surgical intervention .  The patient's history has been reviewed, patient examined, no change in status, stable for surgery.  I have reviewed the patient's chart and labs.  Questions were answered to the patient's satisfaction.     Lyndie Vanderloop K.

## 2016-02-16 NOTE — H&P (View-Only) (Signed)
History of Present Illness Ann Oconnor. Ann Westerfeld MD; 02/13/2016 10:16 AM) The patient is a 40 year old female who presents to discuss consultation. s/p left subclavian vein port placement 08/18/14 for infusion therapy for multiple sclerosis and poor IV access. The patient has been transitioned to a medication that all and requires 1 infusion every 6 months. Her infusion nurse feels that she is suitable for peripheral IV access and no longer needs her port. Overall the patient feels much better. She is much more active and feels that the infusion is helping significantly.   Allergies (Ann Oconnor, CMA; 02/13/2016 9:31 AM) No Known Drug Allergies 08/16/2014  Medication History (Ann Oconnor, CMA; 02/13/2016 9:31 AM) Baclofen (10MG  Tablet, Oral) Active. Gabapentin (400MG  Capsule, Oral) Active. ClonazePAM (0.5MG  Tablet, Oral) Active. PARoxetine HCl ER (37.5MG  Tablet ER 24HR, Oral) Active. Medications Reconciled    Vitals (Ann Oconnor CMA; 02/13/2016 9:31 AM) 02/13/2016 9:31 AM Weight: 225 lb Height: 64in Body Surface Area: 2.06 m Body Mass Index: 38.62 kg/m  Temp.: 2F(Temporal)  Pulse: 75 (Regular)  BP: 130/70 (Sitting, Left Arm, Standard)      Physical Exam Rodman Key K. Ann Eugene MD; 02/13/2016 10:17 AM)  The physical exam findings are as follows: Note:WDWN in NAD Eyes: Pupils equal, round; sclera anicteric HENT: Oral mucosa moist; good dentition Neck: No masses palpated, no thyromegaly Chest - left subclavian port site - well-healed; no seroma or infection Lungs: CTA bilaterally; normal respiratory effort CV: Regular rate and rhythm; no murmurs; extremities well-perfused with no edema Abd: +bowel sounds, soft, non-tender, no palpable organomegaly; no palpable hernias Skin: Warm, dry; no sign of jaundice Psychiatric - alert and oriented x 4; calm mood and affect    Assessment & Plan Rodman Key K. Ann Lauderback MD; 02/13/2016 10:04 AM)  MULTIPLE SCLEROSIS  (G35)  DIFFICULT INTRAVENOUS ACCESS (Z78.9)  Current Plans Schedule for Surgery - Port removal. The surgical procedure has been discussed with the patient. Potential risks, benefits, alternative treatments, and expected outcomes   Ann Oconnor. Ann Dover, MD, [facility]  General/ Trauma Surgery  02/13/2016 10:18 AM

## 2016-02-16 NOTE — Anesthesia Postprocedure Evaluation (Signed)
Anesthesia Post Note  Patient: Ann Oconnor  Procedure(s) Performed: Procedure(s) (LRB): REMOVAL PORT-A-CATH (N/A)  Patient location during evaluation: PACU Anesthesia Type: MAC Level of consciousness: awake and alert Pain management: pain level controlled Vital Signs Assessment: post-procedure vital signs reviewed and stable Respiratory status: spontaneous breathing, nonlabored ventilation, respiratory function stable and patient connected to nasal cannula oxygen Cardiovascular status: stable and blood pressure returned to baseline Anesthetic complications: no    Last Vitals:  Vitals:   02/16/16 1230 02/16/16 1245  BP: 113/65 105/71  Pulse: 60 62  Resp: 18 18  Temp:      Last Pain: There were no vitals filed for this visit.               [name]

## 2016-02-16 NOTE — Transfer of Care (Signed)
Immediate Anesthesia Transfer of Care Note  Patient: Ann Oconnor  Procedure(s) Performed: Procedure(s): REMOVAL PORT-A-CATH (N/A)  Patient Location: PACU  Anesthesia Type:MAC  Level of Consciousness: awake, alert  and oriented  Airway & Oxygen Therapy: Patient Spontanous Breathing  Post-op Assessment: Report given to RN and Post -op Vital signs reviewed and stable  Post vital signs: Reviewed and stable  Last Vitals:  Vitals:   02/16/16 0836  BP: (!) 103/59  Pulse: 81  Resp: 18  Temp: 36.9 C    Last Pain: There were no vitals filed for this visit.       Complications: No apparent anesthesia complications

## 2016-02-16 NOTE — Op Note (Signed)
Preop diagnosis: Multiple sclerosis; need for infusion therapy Postop diagnosis: Same Procedure performed: Port removal Surgeon:Shayna Eblen K. Anesthesia: Local MAC Indications: This is a 40 year old female who presents with long history of multiple sclerosis. She had a port placed in 2016 because of difficult IV access. However her situation has changed and she only requires infusions once every 6 months. She would like to have her port removed.  Description of procedure: The patient brought to the operating room placed in supine position on the operating room table. After an adequate level of intravenous sedation was given, her left chest was prepped with ChloraPrep and draped sterile fashion. A timeout was taken to ensure the proper patient and proper procedure. We infiltrated the area of the port with 0.25% Marcaine with epinephrine. We opened the previous incision. Dissection was carried down to the port with cautery. We removed the 2 sutures holding the port in place. The port was then removed. We held direct pressure over the insertion site for several minutes. Hemostasis was good. We excised the capsule that had formed around the port. The wound was then closed with a deep layer of 3-0 Vicryl and a subcuticular layer of 4-0 Monocryl. Steri-Strips and clean dressings were applied. Patient was then  awakened and brought to the recovery room in stable condition. All sponge, instrument, and needle counts are correct.  Imogene Burn. [name], MD, [facility]  General/ Trauma Surgery  02/16/2016 12:13 PM

## 2016-02-17 ENCOUNTER — Encounter (HOSPITAL_COMMUNITY): Payer: Self-pay | Admitting: Surgery

## 2016-04-03 ENCOUNTER — Ambulatory Visit (INDEPENDENT_AMBULATORY_CARE_PROVIDER_SITE_OTHER): Payer: BLUE CROSS/BLUE SHIELD | Admitting: Gynecology

## 2016-04-03 ENCOUNTER — Encounter: Payer: Self-pay | Admitting: Neurology

## 2016-04-03 ENCOUNTER — Encounter: Payer: Self-pay | Admitting: Gynecology

## 2016-04-03 VITALS — BP 124/74

## 2016-04-03 DIAGNOSIS — R87612 Low grade squamous intraepithelial lesion on cytologic smear of cervix (LGSIL): Secondary | ICD-10-CM

## 2016-04-03 NOTE — Progress Notes (Signed)
    Ann Oconnor 11-Apr-1975 [id number]        41 y.o.  G0P0 presents for colposcopy. History of cone biopsy for CIN 2 2008. She had CIN 1 on colposcopic biopsy with negative ECC 08/2010 at another practice. Pap smear 2013 ASCUS with positive high-risk HPV with follow up biopsy showing LGSIL with negative ECC. Pap smear again positive for HPV LGSIL 09/2012. Negative ECC with normal colposcopy at that time. Pap smear. Most recent Pap smear 02/2016 LGSIL  Past medical history,surgical history, problem list, medications, allergies, family history and social history were all reviewed and documented in the EPIC chart.  Directed ROS with pertinent positives and negatives documented in the history of present illness/assessment and plan.  Exam: [name] assistant Vitals:   04/03/16 1559  BP: 124/74   General appearance:  Normal Abdomen soft nontender without masses or guarding Pelvic external BUS vagina normal. Cervix grossly normal. Uterus difficult to palpate but no gross masses or tenderness. Adnexa without gross masses or tenderness  Colposcopy performed after acetic acid cleanse is adequate and normal. ECC performed. Patient tolerated well.  Physical Exam  Genitourinary:       Assessment/Plan:  41 y.o. 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 Most recent Pap smear did not run HPV. Colposcopy today is adequate normal. ECC performed. Patient will follow up for results. If normal or low-grade then plan expectant management. Otherwise then will triage results.    Anastasio Auerbach MD, 4:25 PM 04/03/2016

## 2016-04-03 NOTE — Patient Instructions (Signed)
Office will call with biopsy results 

## 2016-04-04 LAB — PATHOLOGY

## 2016-04-19 ENCOUNTER — Encounter: Payer: Self-pay | Admitting: Neurology

## 2016-04-19 ENCOUNTER — Ambulatory Visit (INDEPENDENT_AMBULATORY_CARE_PROVIDER_SITE_OTHER): Payer: BLUE CROSS/BLUE SHIELD | Admitting: Neurology

## 2016-04-19 VITALS — BP 97/61 | HR 87 | Resp 16 | Ht 64.0 in | Wt 223.0 lb

## 2016-04-19 DIAGNOSIS — G35 Multiple sclerosis: Secondary | ICD-10-CM | POA: Diagnosis not present

## 2016-04-19 DIAGNOSIS — Z79899 Other long term (current) drug therapy: Secondary | ICD-10-CM | POA: Diagnosis not present

## 2016-04-19 DIAGNOSIS — F418 Other specified anxiety disorders: Secondary | ICD-10-CM | POA: Diagnosis not present

## 2016-04-19 DIAGNOSIS — R200 Anesthesia of skin: Secondary | ICD-10-CM

## 2016-04-19 DIAGNOSIS — R260 Ataxic gait: Secondary | ICD-10-CM

## 2016-04-19 MED ORDER — AMPHETAMINE-DEXTROAMPHET ER 15 MG PO CP24: 15.0000 mg | ORAL_CAPSULE | ORAL | 0 refills | Status: DC

## 2016-04-19 NOTE — Progress Notes (Signed)
[facility]  PATIENT: Ann Oconnor DOB: [birth year]  REFERRING DOCTOR OR PCP:  Delia Chimes SOURCE: Patient, records, images  _________________________________   HISTORICAL  CHIEF COMPLAINT:  Chief Complaint  Patient presents with  . Multiple Sclerosis    Sts. she continues to tolerate Ocrelizumab with only insomnia for a day or so after the infusion.  Last infused yesterday. Sts. still has numbness in her hands and feet and wonders if this will improve/fim    HISTORY OF PRESENT ILLNESS:  Ann Oconnor is a 41 yo woman with a very aggressive MS.      MS:    She switched from Novantrone to ocrelizumab in July 2017. She had her second treatment yesterday.   She tolerated the infusion well.  She had a total of 5 Novantrone treatments at 12 mg/M2.     She continues to have some deficits from her severe exacerbations early 2016.                                                                     Gait/strength:  She is able to walk much better and has walked a mie without a cane but that tires her out .   She regularly walks > 100 yards without rest.     She can climb a short flight of stairs.   She is driving now short distances.  She has mild weakness and spasticity in legs but not arms   Gait issues are due more to balance than strength.     Numbness:   She has bilateral fairly symmetric numbness in her arms and legs since the 2016 exaceration.    Numbness is not complete as it was but is still severe.   She had normal NCV.    Numbness is slightly tingling but not painful.   She is on gabapentin 400 mg twice a day with benefit   Bladder/bowel:   She reports no incontinence since last visit.     No significant frequency.    Vision:  She denies MS related visual issues.  Fatigue/sleep:  She has fatigue most days, worse if more active and worse in the afternoons.   Fatigue is more cognitive many days than physical.    She is able to sleep fairly well at  night.  Mood/cognition:   Mood is much better than in 2016 and early 2017.  She has mild depression and anxiety, helped by Paxil.  Her cognitive issues have returned close to baseline.  She notes some verbal fluency issues.    MS:    She presented in 2002 with numbness in her legs and poor gait.  The MRI scan showed MS plaques in the spinal cord and the brain and she was diagnosed with multiple sclerosis. She was initially placed on Rebif. At first she did well with only a couple of small central exacerbations. However, 2003 she had a more severe exacerbation that affected her gait. She was placed on a combination Rebif and Copaxone for a short period of time and then was on Copaxone monotherapy. She switched to [place] in 2011 due to needle fatigue. She had numbness in her feet that would not go away and was switched to Tecfidera from [place] in  November 2015.  In January 2016, she lost strength in both legs and urinary incontinence. She had a course of IV steroids without any benefit in late January. Because there was no benefit, she was admitted to [facility] for a course of plasmapheresis starting February 5. She got no benefit from the plasmapheresis. During the next week, she was home but she was doing worse and worse. She then presented to [facility]. She was admitted for another week of steroids. An MRI done at admission showed very aggressive MS changes.    Brain 04/28/2014 shows multiple infratentorial plaques including left greater than right middle cerebellar peduncles, bilateral pons, and cerebellum. Additionally there are multiple foci in the hemispheres, some with a concentric demyelinating pattern that could be consistent with Balo's concentric sclerosis, an especially fulminant form of multiple sclerosis. She was unable to complete the examination or get contrast due to the need to terminate the study early.   When compared to an MRI dated 02/07/2012, there has been dramatic change with  multiple new brain stem and hemispheric foci.  MRI of the cervical spine 10/26/2010. showed multiple plaques:  right at the C2 level, left aspect C2-3 level, right aspect C3 level, the posterior central aspect C3 level, upper C6 level slightly greater to the right and C7 level greater to the left.  Abnormal signal within the left aspect of the cord at the T3- T4 level.   MRI of the brain in 05/19/2014 showed several additional posterior fossa lesions prompting initiation of Novantrone.  Marland Kitchen     REVIEW OF SYSTEMS: Constitutional: No fevers, chills, sweats, or change in appetite.  Fatigue Eyes: No visual changes, double vision, eye pain Ear, nose and throat: No hearing loss, ear pain, nasal congestion, sore throat Cardiovascular: No chest pain, palpitations Respiratory: No shortness of breath at rest or with exertion.   No wheezes GastrointestinaI: No nausea, vomiting.  Some fecal incontinence Genitourinary: as above. Musculoskeletal: No neck pain, back pain Integumentary: No rash, pruritus, skin lesions Neurological: as above Psychiatric: Notes depression at this time.  Some anxiety Endocrine: No palpitations, diaphoresis, change in appetite, change in weigh or increased thirst Hematologic/Lymphatic: No anemia, purpura, petechiae. Allergic/Immunologic: No itchy/runny eyes, nasal congestion, recent allergic reactions, rashes  ALLERGIES: No Known Allergies  HOME MEDICATIONS:  Current Outpatient Prescriptions:  .  acetaminophen (TYLENOL) 500 MG tablet, Take 1,500 mg by mouth daily as needed for moderate pain or headache., Disp: , Rfl:  .  gabapentin (NEURONTIN) 400 MG capsule, TAKE 1 CAPSULE(400 MG) BY MOUTH TWICE DAILY, Disp: 60 capsule, Rfl: 11 .  ocrelizumab 600 mg in sodium chloride 0.9 % 500 mL, Inject 600 mg into the vein every 6 (six) months., Disp: , Rfl:  .  PARoxetine (PAXIL-CR) 37.5 MG 24 hr tablet, TAKE 1 TABLET(37.5 MG) BY MOUTH DAILY, Disp: 30 tablet, Rfl: 11 .  rosuvastatin  (CRESTOR) 10 MG tablet, Take 10 mg by mouth every evening. , Disp: , Rfl: 5 .  amphetamine-dextroamphetamine (ADDERALL XR) 15 MG 24 hr capsule, Take 1 capsule by mouth every morning., Disp: 30 capsule, Rfl: 0 No current facility-administered medications for this visit.   Facility-Administered Medications Ordered in Other Visits:  .  gadopentetate dimeglumine (MAGNEVIST) injection 20 mL, 20 mL, Intravenous, Once PRN, Britt Bottom, MD  PAST MEDICAL HISTORY: Past Medical History:  Diagnosis Date  . Anxiety   . Family history of adverse reaction to anesthesia    Father - difficulty awaken- has sleep apena  . Gait  instability   . Head injury, closed, with brief LOC ([place]) 2003   fall  . Incontinence   . 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  Colpo normal with neg ECC  . Movement disorder   . MS (multiple sclerosis) ([place]) 2002  . Seizures ([place]) 2003   none since 2003  . UTI (lower urinary tract infection)   . Vision abnormalities     PAST SURGICAL HISTORY: Past Surgical History:  Procedure Laterality Date  . CERVICAL CONE BIOPSY  2008   CIN 2  . COLONOSCOPY    . PORT-A-CATH REMOVAL N/A 02/16/2016   Procedure: REMOVAL PORT-A-CATH;  Surgeon: Donnie Mesa, MD;  Location: [facility];  Service: General;  Laterality: N/A;  . PORTACATH PLACEMENT Left 08/18/14  . PORTACATH PLACEMENT Left 08/18/2014   Procedure: LEFT SUBCLAVIAN VEIN PORT PLACEMENT;  Surgeon: Donnie Mesa, MD;  Location: [facility];  Service: General;  Laterality: Left;    FAMILY HISTORY: Family History  Problem Relation Age of Onset  . Thyroid disease Mother   . Heart disease Father   . Mitral valve prolapse Father   . Thyroid disease Sister   . Cancer Paternal Aunt     not sure what kind, maybe cervix  . Thyroid disease Sister     SOCIAL HISTORY:  Social History   Social History  . Marital status: Single    Spouse name: N/A  . Number of children: N/A   . Years of education: N/A   Occupational History  . Not on file.   Social History Main Topics  . Smoking status: Never Smoker  . Smokeless tobacco: Never Used  . Alcohol use No  . Drug use: No  . Sexual activity: No   Other Topics Concern  . Not on file   Social History Narrative  . No narrative on file     PHYSICAL EXAM  Vitals:   04/19/16 0907  BP: 97/61  Pulse: 87  Resp: 16  Weight: 223 lb (101.2 kg)  Height: 5\' 4"  (1.626 m)    Body mass index is 38.28 kg/m.   General: The patient is well-developed and well-nourished and in no acute distress   Neurologic Exam  Mental status: The patient is alert and oriented x 3 at the time of the examination. The patient has apparent normal recent and remote memory,  attention span and concentration ability now seem normal.   Speech is normal.     Cranial nerves: Extraocular movements are full. Facial symmetry is present. There is good facial sensation to soft touch bilaterally.Facial strength is normal.  Trapezius and sternocleidomastoid strength is normal. No dysarthria is noted.  No obvious hearing deficits are noted.  Motor:  Muscle bulk is normal.   Tone is mildly increased in legs.. Strength is  5/5 in arms and legs now.  Sensory: Sensory testing is intact to pinprick, soft touch and vibration sensation in arms but she has decreased vibration and touch/temp in left leg and slight dec vibration in right leg  Coordination: Cerebellar testing reveals mild reduced right finger-nose-finger and heel-to-shin bilaterally.  Gait and station: She has a normal station. Gait shows mild left foot drop and slightly reduced stride.   No Romberg sign  Reflexes: Deep tendon reflexes are increased bilaterally, slightly more on the right.        DIAGNOSTIC DATA (LABS, IMAGING, TESTING) - I reviewed patient records, labs, notes, testing and imaging myself where available.  Lab Results  Component Value Date   WBC 4.7 02/16/2016    HGB 12.8 02/16/2016   HCT 39.4 02/16/2016   MCV 95.2 02/16/2016   PLT 272 02/16/2016      Component Value Date/Time   NA 139 02/16/2016 0851   NA 139 05/17/2015 1323   K 3.9 02/16/2016 0851   CL 106 02/16/2016 0851   CO2 26 02/16/2016 0851   GLUCOSE 86 02/16/2016 0851   BUN 10 02/16/2016 0851   BUN 14 05/17/2015 1323   CREATININE 0.82 02/16/2016 0851   CREATININE 0.75 09/02/2012 1057   CALCIUM 9.4 02/16/2016 0851   PROT 7.1 08/02/2015 1049   ALBUMIN 4.6 08/02/2015 1049   AST 21 08/02/2015 1049   ALT 14 08/02/2015 1049   ALKPHOS 88 08/02/2015 1049   BILITOT 0.2 08/02/2015 1049   GFRNONAA >60 02/16/2016 0851   GFRAA >60 02/16/2016 0851   Lab Results  Component Value Date   CHOL 217 (H) 02/09/2013   HDL 45 02/09/2013   LDLCALC 146 (H) 02/09/2013   TRIG 130 02/09/2013   CHOLHDL 4.8 02/09/2013   No results found for: HGBA1C Lab Results  Component Value Date   VITAMINB12 348 04/04/2015   Lab Results  Component Value Date   TSH 2.871 08/21/2011       ASSESSMENT AND PLAN  Multiple sclerosis ([place]) - Plan: CBC with Differential/Platelet, Hepatic function panel  High risk medication use - Plan: CBC with Differential/Platelet, Hepatic function panel  Ataxic gait  Depression with anxiety  Numbness   1.    Continue ocrelizumab as a disease modifying therapy. Check CBC and hepatic function 2.    Trial of a stimulant for her cognitive greater than physical fatigue.  3.    Continue to try to stay active and exercises as tolerated. RTC 5-6 months or sooner if problems.    Perle Gibbon A. Felecia Shelling, MD, PhD [id number], [id number] AM Certified in Neurology, Clinical Neurophysiology, Sleep Medicine, Pain Medicine and Neuroimaging  [facility] [address], [place], [place] [address] [phone]  BSA = 2.28  Dose 12 mg/M2 = 27.35

## 2016-04-20 LAB — CBC WITH DIFFERENTIAL/PLATELET
BASOS ABS: 0 10*3/uL (ref 0.0–0.2)
BASOS: 0 %
EOS (ABSOLUTE): 0 10*3/uL (ref 0.0–0.4)
Eos: 0 %
Hematocrit: 38 % (ref 34.0–46.6)
Hemoglobin: 12.1 g/dL (ref 11.1–15.9)
IMMATURE GRANS (ABS): 0 10*3/uL (ref 0.0–0.1)
Immature Granulocytes: 0 %
LYMPHS ABS: 1.1 10*3/uL (ref 0.7–3.1)
LYMPHS: 11 %
MCH: 30.1 pg (ref 26.6–33.0)
MCHC: 31.8 g/dL (ref 31.5–35.7)
MCV: 95 fL (ref 79–97)
MONOS ABS: 0.8 10*3/uL (ref 0.1–0.9)
Monocytes: 8 %
NEUTROS ABS: 7.8 10*3/uL — AB (ref 1.4–7.0)
Neutrophils: 81 %
Platelets: 272 10*3/uL (ref 150–379)
RBC: 4.02 x10E6/uL (ref 3.77–5.28)
RDW: 13.8 % (ref 12.3–15.4)
WBC: 9.7 10*3/uL (ref 3.4–10.8)

## 2016-04-20 LAB — HEPATIC FUNCTION PANEL
ALT: 12 IU/L (ref 0–32)
AST: 18 IU/L (ref 0–40)
Albumin: 4.7 g/dL (ref 3.5–5.5)
Alkaline Phosphatase: 80 IU/L (ref 39–117)
Bilirubin Total: <0.2 mg/dL (ref 0.0–1.2)
Bilirubin, Direct: 0.05 mg/dL (ref 0.00–0.40)
TOTAL PROTEIN: 7.1 g/dL (ref 6.0–8.5)

## 2016-04-23 ENCOUNTER — Telehealth: Payer: Self-pay | Admitting: *Deleted

## 2016-04-23 NOTE — Telephone Encounter (Signed)
-----   Message from Britt Bottom, MD sent at 04/20/2016 12:47 PM EST ----- Please let her know that the lab work looks good.

## 2016-04-23 NOTE — Telephone Encounter (Signed)
I have spoken with Ann Oconnor today and per RAS, advised labs done in our office look good.  She verbalized understanding of same/fim

## 2016-05-02 ENCOUNTER — Ambulatory Visit (INDEPENDENT_AMBULATORY_CARE_PROVIDER_SITE_OTHER): Payer: BLUE CROSS/BLUE SHIELD

## 2016-05-02 ENCOUNTER — Other Ambulatory Visit: Payer: BLUE CROSS/BLUE SHIELD

## 2016-05-02 DIAGNOSIS — Z79899 Other long term (current) drug therapy: Secondary | ICD-10-CM | POA: Diagnosis not present

## 2016-05-02 DIAGNOSIS — G35 Multiple sclerosis: Secondary | ICD-10-CM | POA: Diagnosis not present

## 2016-05-02 DIAGNOSIS — G35D Multiple sclerosis, unspecified: Secondary | ICD-10-CM

## 2016-05-02 MED ORDER — GADOPENTETATE DIMEGLUMINE 469.01 MG/ML IV SOLN: 20.0000 mL | Freq: Once | INTRAVENOUS | Status: DC | PRN

## 2016-05-04 ENCOUNTER — Telehealth: Payer: Self-pay | Admitting: *Deleted

## 2016-05-04 NOTE — Telephone Encounter (Signed)
I have spoken with Ann Oconnor this am and per RAS, advised MRI brain shows no new changes.  She verbalized understanding of same/fim

## 2016-05-04 NOTE — Telephone Encounter (Signed)
-----   Message from Britt Bottom, MD sent at 05/03/2016  6:43 PM EST ----- Please let her know that the MRI of the brain does not show any changes.

## 2016-05-15 ENCOUNTER — Other Ambulatory Visit: Payer: Self-pay | Admitting: Internal Medicine

## 2016-05-15 DIAGNOSIS — Z1231 Encounter for screening mammogram for malignant neoplasm of breast: Secondary | ICD-10-CM

## 2016-05-28 ENCOUNTER — Other Ambulatory Visit: Payer: Self-pay | Admitting: Neurology

## 2016-07-02 ENCOUNTER — Ambulatory Visit
Admission: RE | Admit: 2016-07-02 | Discharge: 2016-07-02 | Disposition: A | Discharge status: Home / Self Care | Payer: BLUE CROSS/BLUE SHIELD | Source: Ambulatory Visit | Attending: Internal Medicine | Admitting: Internal Medicine

## 2016-07-02 DIAGNOSIS — Z1231 Encounter for screening mammogram for malignant neoplasm of breast: Secondary | ICD-10-CM

## 2016-07-15 ENCOUNTER — Other Ambulatory Visit: Payer: Self-pay | Admitting: Neurology

## 2016-08-12 ENCOUNTER — Other Ambulatory Visit: Payer: Self-pay | Admitting: Neurology

## 2016-08-14 ENCOUNTER — Other Ambulatory Visit: Payer: Self-pay | Admitting: Neurology

## 2016-09-06 ENCOUNTER — Other Ambulatory Visit: Payer: Self-pay | Admitting: Neurology

## 2016-09-10 ENCOUNTER — Other Ambulatory Visit: Payer: Self-pay | Admitting: Neurology

## 2016-09-11 ENCOUNTER — Encounter: Payer: Self-pay | Admitting: Neurology

## 2016-10-05 ENCOUNTER — Other Ambulatory Visit: Payer: Self-pay | Admitting: Neurology

## 2016-10-07 ENCOUNTER — Encounter: Payer: Self-pay | Admitting: Women's Health

## 2016-10-08 NOTE — Telephone Encounter (Signed)
I informed, pt you are out of the office on vacation, pt said bleeding started on 10/06/16 wearing pads changing every couple of hours.

## 2016-10-08 NOTE — Telephone Encounter (Signed)
Ann Oconnor, at her annual exam in Dec you wrote "Postmenopausal on no HRT with no bleeding."  I imagine you told her to report if she had any bleeding.

## 2016-10-17 ENCOUNTER — Ambulatory Visit: Payer: BLUE CROSS/BLUE SHIELD | Admitting: Neurology

## 2016-10-26 ENCOUNTER — Encounter: Payer: Self-pay | Admitting: Women's Health

## 2016-10-29 ENCOUNTER — Encounter: Payer: Self-pay | Admitting: Neurology

## 2016-10-29 ENCOUNTER — Ambulatory Visit (INDEPENDENT_AMBULATORY_CARE_PROVIDER_SITE_OTHER): Payer: BLUE CROSS/BLUE SHIELD | Admitting: Neurology

## 2016-10-29 VITALS — BP 94/68 | HR 68 | Resp 16 | Ht 64.0 in | Wt 222.0 lb

## 2016-10-29 DIAGNOSIS — E559 Vitamin D deficiency, unspecified: Secondary | ICD-10-CM

## 2016-10-29 DIAGNOSIS — E785 Hyperlipidemia, unspecified: Secondary | ICD-10-CM

## 2016-10-29 DIAGNOSIS — R200 Anesthesia of skin: Secondary | ICD-10-CM | POA: Diagnosis not present

## 2016-10-29 DIAGNOSIS — Z79899 Other long term (current) drug therapy: Secondary | ICD-10-CM

## 2016-10-29 DIAGNOSIS — G35 Multiple sclerosis: Secondary | ICD-10-CM

## 2016-10-29 DIAGNOSIS — R260 Ataxic gait: Secondary | ICD-10-CM | POA: Diagnosis not present

## 2016-10-29 MED ORDER — AMANTADINE HCL 100 MG PO CAPS: 100.0000 mg | ORAL_CAPSULE | Freq: Two times a day (BID) | ORAL | 5 refills | Status: DC

## 2016-10-29 NOTE — Telephone Encounter (Signed)
Telephone call, amenorrheic for the past 2 years, has MS and was on chemotherapy about 2 years ago, became amenorrheic, [place] 69, at that time was using a walker, had much difficulty walking, is currently walking without any assistance and is doing much better. Not sexually active in years. Cycle August 1, had another cycle August 25. Office visit will check [place]. If elevated will proceed to a sonohysterogram.

## 2016-10-29 NOTE — Progress Notes (Signed)
[facility]  PATIENT: Ann Oconnor DOB: June 19, 1975  REFERRING DOCTOR OR PCP:  Delia Chimes SOURCE: Patient, records, images  _________________________________   HISTORICAL  CHIEF COMPLAINT:  Chief Complaint  Patient presents with  . Multiple Sclerosis    Sts. has done well with Ocrevus.  Next infusion is tomorrow.  Couldn't tolerate Adderall, she thinks b/c it made her jittery. Denies new or worsening sx/fim    HISTORY OF PRESENT ILLNESS:  Ann Oconnor is a 41 yo woman with a very aggressive MS.      Update 10/29/2016:  She is a ocrelizumab. Her next infusion is later this week. She has not had any exacerbations while on ocrelizumab and she tolerates it well. Before that she was on Novantrone which helped to stabilize her MS but she needed to switch due to the lifetime limited.  She feels gait has improved further this year. Specifically she is walking well without a cane. She is able to do about 1 mile. She does need to look down at the ground or her feet when she walks for better balance and she did have 1 fall.    She continues to have numbness in both legs that has mildly improved over the last year. Bladder function is doing well and she has not had any incontinence since her last visit. She continues to experience fatigue that is physical more than cognitive. Adderall made her jittery so she stopped. She does not think she has ever been on amantadine.   __________________________________ From 04/19/2016: MS:    She switched from Novantrone to ocrelizumab in July 2017. She had her second treatment yesterday.   She tolerated the infusion well.  She had a total of 5 Novantrone treatments at 12 mg/M2.     She continues to have some deficits from her severe exacerbations early 2016.                                                                     Gait/strength:  She is able to walk much better and has walked a mie without a cane but that tires her out .   She  regularly walks > 100 yards without rest.     She can climb a short flight of stairs.   She is driving now short distances.  She has mild weakness and spasticity in legs but not arms   Gait issues are due more to balance than strength.     Numbness:   She has bilateral fairly symmetric numbness in her arms and legs since the 2016 exaceration.    Numbness is not complete as it was but is still severe.   She had normal NCV.    Numbness is slightly tingling but not painful.   She is on gabapentin 400 mg twice a day with benefit   Bladder/bowel:   She reports no incontinence since last visit.     No significant frequency.    Vision:  She denies MS related visual issues.  Fatigue/sleep:  She has fatigue most days, worse if more active and worse in the afternoons.   Fatigue is more cognitive many days than physical.    She is able to sleep fairly well at night.  Mood/cognition:  Mood is much better than in 2016 and early 2017.  She has mild depression and anxiety, helped by Paxil.  Her cognitive issues have returned close to baseline.  She notes some verbal fluency issues.    MS:    She presented in 2002 with numbness in her legs and poor gait.  The MRI scan showed MS plaques in the spinal cord and the brain and she was diagnosed with multiple sclerosis. She was initially placed on Rebif. At first she did well with only a couple of small central exacerbations. However, 2003 she had a more severe exacerbation that affected her gait. She was placed on a combination Rebif and Copaxone for a short period of time and then was on Copaxone monotherapy. She switched to [place] in 2011 due to needle fatigue. She had numbness in her feet that would not go away and was switched to [place] from [place] in November 2015.  In January 2016, she lost strength in both legs and urinary incontinence. She had a course of IV steroids without any benefit in late January. Because there was no benefit, she was admitted to [facility] for  a course of plasmapheresis starting February 5. She got no benefit from the plasmapheresis. During the next week, she was home but she was doing worse and worse. She then presented to [facility]. She was admitted for another week of steroids. An MRI done at admission showed very aggressive MS changes.    Brain 04/28/2014 shows multiple infratentorial plaques including left greater than right middle cerebellar peduncles, bilateral pons, and cerebellum. Additionally there are multiple foci in the hemispheres, some with a concentric demyelinating pattern that could be consistent with Balo's concentric sclerosis, an especially fulminant form of multiple sclerosis. She was unable to complete the examination or get contrast due to the need to terminate the study early.   When compared to an MRI dated 02/07/2012, there has been dramatic change with multiple new brain stem and hemispheric foci.  MRI of the cervical spine 10/26/2010. showed multiple plaques:  right at the C2 level, left aspect C2-3 level, right aspect C3 level, the posterior central aspect C3 level, upper C6 level slightly greater to the right and C7 level greater to the left.  Abnormal signal within the left aspect of the cord at the T3- T4 level.   MRI of the brain in 05/19/2014 showed several additional posterior fossa lesions prompting initiation of Novantrone.  Marland Kitchen     REVIEW OF SYSTEMS: Constitutional: No fevers, chills, sweats, or change in appetite.  Fatigue Eyes: No visual changes, double vision, eye pain Ear, nose and throat: No hearing loss, ear pain, nasal congestion, sore throat Cardiovascular: No chest pain, palpitations Respiratory: No shortness of breath at rest or with exertion.   No wheezes GastrointestinaI: No nausea, vomiting.  Some fecal incontinence Genitourinary: as above. Musculoskeletal: No neck pain, back pain Integumentary: No rash, pruritus, skin lesions Neurological: as above Psychiatric: Notes depression at  this time.  Some anxiety Endocrine: No palpitations, diaphoresis, change in appetite, change in weigh or increased thirst Hematologic/Lymphatic: No anemia, purpura, petechiae. Allergic/Immunologic: No itchy/runny eyes, nasal congestion, recent allergic reactions, rashes  ALLERGIES: No Known Allergies  HOME MEDICATIONS:  Current Outpatient Prescriptions:  .  acetaminophen (TYLENOL) 500 MG tablet, Take 1,500 mg by mouth daily as needed for moderate pain or headache., Disp: , Rfl:  .  gabapentin (NEURONTIN) 400 MG capsule, TAKE 1 CAPSULE(400 MG) BY MOUTH TWICE DAILY, Disp: 60 capsule, Rfl: 5 .  ocrelizumab 600 mg in sodium chloride 0.9 % 500 mL, Inject 600 mg into the vein every 6 (six) months., Disp: , Rfl:  .  PARoxetine (PAXIL-CR) 37.5 MG 24 hr tablet, TAKE 1 TABLET(37.5 MG) BY MOUTH DAILY, Disp: 30 tablet, Rfl: 0 .  rosuvastatin (CRESTOR) 10 MG tablet, Take 10 mg by mouth every evening. , Disp: , Rfl: 5 No current facility-administered medications for this visit.   Facility-Administered Medications Ordered in Other Visits:  .  gadopentetate dimeglumine (MAGNEVIST) injection 20 mL, 20 mL, Intravenous, Once PRN, Sater, Richard A, MD .  gadopentetate dimeglumine (MAGNEVIST) injection 20 mL, 20 mL, Intravenous, Once PRN, Sater, Nanine Means, MD  PAST MEDICAL HISTORY: Past Medical History:  Diagnosis Date  . Anxiety   . Family history of adverse reaction to anesthesia    Father - difficulty awaken- has sleep apena  . Gait instability   . Head injury, closed, with brief LOC ([place]) 2003   fall  . Incontinence   . 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  Colpo normal with neg ECC  . Movement disorder   . MS (multiple sclerosis) ([place]) 2002  . Seizures ([place]) 2003   none since 2003  . UTI (lower urinary tract infection)   . Vision abnormalities     PAST SURGICAL HISTORY: Past Surgical History:  Procedure Laterality Date  .  CERVICAL CONE BIOPSY  2008   CIN 2  . COLONOSCOPY    . PORT-A-CATH REMOVAL N/A 02/16/2016   Procedure: REMOVAL PORT-A-CATH;  Surgeon: Donnie Mesa, MD;  Location: [facility];  Service: General;  Laterality: N/A;  . PORTACATH PLACEMENT Left 08/18/14  . PORTACATH PLACEMENT Left 08/18/2014   Procedure: LEFT SUBCLAVIAN VEIN PORT PLACEMENT;  Surgeon: Donnie Mesa, MD;  Location: [facility] OR;  Service: General;  Laterality: Left;    FAMILY HISTORY: Family History  Problem Relation Age of Onset  . Thyroid disease Mother   . Heart disease Father   . Mitral valve prolapse Father   . Thyroid disease Sister   . Cancer Paternal Aunt        not sure what kind, maybe cervix  . Thyroid disease Sister     SOCIAL HISTORY:  Social History   Social History  . Marital status: Single    Spouse name: N/A  . Number of children: N/A  . Years of education: N/A   Occupational History  . Not on file.   Social History Main Topics  . Smoking status: Never Smoker  . Smokeless tobacco: Never Used  . Alcohol use No  . Drug use: No  . Sexual activity: No   Other Topics Concern  . Not on file   Social History Narrative  . No narrative on file     PHYSICAL EXAM  Vitals:   10/29/16 1127  BP: 94/68  Pulse: 68  Resp: 16  Weight: 222 lb (100.7 kg)  Height: 5\' 4"  (1.626 m)    Body mass index is 38.11 kg/m.   General: The patient is well-developed and well-nourished and in no acute distress   Neurologic Exam  Mental status: The patient is alert and oriented x 3 at the time of the examination. The patient has apparent normal recent and remote memory,  attention span and concentration ability now seem normal.   Speech is normal.     Cranial nerves: Extraocular movements are full. Facial sensation is normal.. No dysarthria is noted.  No obvious hearing  deficits are noted.  Motor:  Muscle bulk is normal.   Tone is mildly increased in legs.. Strength is  5/5 in arms and legs now.  Sensory: Sensory  testing is intact to pinprick, soft touch and vibration sensation in arms.   She has decreased vibration sensation in the knees and feet. She has decreased touch and temperature sensation in the legs, more so on the left than the right.   Coordination: Cerebellar testing reveals mild reduced right finger-nose-finger and slightly reduced heel-to-shin bilaterally.  Gait and station: She has a normal station. Her gait shows a minimal left foot drop and stride has improved and is near normal now. Her tandem gait is wide. There is no Romberg sign. Gait shows mild left foot drop and slightly reduced stride.   No Romberg sign  Reflexes: Deep tendon reflexes are increased bilaterally, slightly more on the right.        DIAGNOSTIC DATA (LABS, IMAGING, TESTING) - I reviewed patient records, labs, notes, testing and imaging myself where available.  Lab Results  Component Value Date   WBC 9.7 04/19/2016   HGB 12.1 04/19/2016   HCT 38.0 04/19/2016   MCV 95 04/19/2016   PLT 272 04/19/2016      Component Value Date/Time   NA 139 02/16/2016 0851   NA 139 05/17/2015 1323   K 3.9 02/16/2016 0851   CL 106 02/16/2016 0851   CO2 26 02/16/2016 0851   GLUCOSE 86 02/16/2016 0851   BUN 10 02/16/2016 0851   BUN 14 05/17/2015 1323   CREATININE 0.82 02/16/2016 0851   CREATININE 0.75 09/02/2012 1057   CALCIUM 9.4 02/16/2016 0851   PROT 7.1 04/19/2016 1005   ALBUMIN 4.7 04/19/2016 1005   AST 18 04/19/2016 1005   ALT 12 04/19/2016 1005   ALKPHOS 80 04/19/2016 1005   BILITOT <0.2 04/19/2016 1005   GFRNONAA >60 02/16/2016 0851   GFRAA >60 02/16/2016 0851   Lab Results  Component Value Date   CHOL 217 (H) 02/09/2013   HDL 45 02/09/2013   LDLCALC 146 (H) 02/09/2013   TRIG 130 02/09/2013   CHOLHDL 4.8 02/09/2013   No results found for: HGBA1C Lab Results  Component Value Date   VITAMINB12 348 04/04/2015   Lab Results  Component Value Date   TSH 2.871 08/21/2011       ASSESSMENT AND  PLAN  Multiple sclerosis ([place]) - Plan: CBC with Differential/Platelet, Comprehensive metabolic panel, VITAMIN D 25 Hydroxy (Vit-D Deficiency, Fractures)  High risk medication use - Plan: CBC with Differential/Platelet, Comprehensive metabolic panel  Numbness of both lower extremities  Ataxic gait  Hyperlipidemia, unspecified hyperlipidemia type - Plan: Lipid Panel  Vitamin D deficiency - Plan: VITAMIN D 25 Hydroxy (Vit-D Deficiency, Fractures)   1.    She will continue ocrelizumab as her disease modifying therapy. She will have her next infusion this week. We will check labwork when she returns for the infusion 2.   Also check vitamin D at that time as she has been deficient in the past. Supplement if necessary. 3.    Amantadine for fatigue. 4.   Return in 6 months but call sooner if there are new or worsening neurologic symptoms. 5.   She is neurologically cleared to go on an [place]. Felecia Shelling, MD, PhD 9/37/3428, 76:81 PM Certified in Neurology, Clinical Neurophysiology, Sleep Medicine, Pain Medicine and Neuroimaging  [facility] [address] [place], [place] [address] [phone]  BSA = 2.28  Dose 12 mg/M2 = 27.35yt                                                                                                                                                                                           88

## 2016-10-30 ENCOUNTER — Other Ambulatory Visit: Payer: Self-pay | Admitting: Neurology

## 2016-10-30 ENCOUNTER — Other Ambulatory Visit (INDEPENDENT_AMBULATORY_CARE_PROVIDER_SITE_OTHER): Payer: Self-pay

## 2016-10-30 ENCOUNTER — Other Ambulatory Visit: Payer: Self-pay

## 2016-10-30 DIAGNOSIS — E785 Hyperlipidemia, unspecified: Secondary | ICD-10-CM

## 2016-10-30 DIAGNOSIS — G35 Multiple sclerosis: Secondary | ICD-10-CM

## 2016-10-30 DIAGNOSIS — E559 Vitamin D deficiency, unspecified: Secondary | ICD-10-CM

## 2016-10-30 DIAGNOSIS — Z79899 Other long term (current) drug therapy: Secondary | ICD-10-CM

## 2016-10-30 DIAGNOSIS — Z0289 Encounter for other administrative examinations: Secondary | ICD-10-CM

## 2016-10-31 ENCOUNTER — Telehealth: Payer: Self-pay | Admitting: *Deleted

## 2016-10-31 LAB — LIPID PANEL
Chol/HDL Ratio: 2.4 ratio (ref 0.0–4.4)
Cholesterol, Total: 143 mg/dL (ref 100–199)
HDL: 59 mg/dL (ref >39)
LDL CALC: 68 mg/dL (ref 0–99)
Triglycerides: 78 mg/dL (ref 0–149)
VLDL CHOLESTEROL CAL: 16 mg/dL (ref 5–40)

## 2016-10-31 LAB — COMPREHENSIVE METABOLIC PANEL
ALBUMIN: 4.5 g/dL (ref 3.5–5.5)
ALT: 8 IU/L (ref 0–32)
AST: 20 IU/L (ref 0–40)
Albumin/Globulin Ratio: 1.9 (ref 1.2–2.2)
Alkaline Phosphatase: 82 IU/L (ref 39–117)
BUN / CREAT RATIO: 9 (ref 9–23)
BUN: 6 mg/dL (ref 6–24)
Bilirubin Total: 0.3 mg/dL (ref 0.0–1.2)
CALCIUM: 9.1 mg/dL (ref 8.7–10.2)
CHLORIDE: 104 mmol/L (ref 96–106)
CO2: 22 mmol/L (ref 20–29)
CREATININE: 0.67 mg/dL (ref 0.57–1.00)
GFR, EST AFRICAN AMERICAN: 125 mL/min/{1.73_m2} (ref >59)
GFR, EST NON AFRICAN AMERICAN: 109 mL/min/{1.73_m2} (ref >59)
Globulin, Total: 2.4 g/dL (ref 1.5–4.5)
Glucose: 96 mg/dL (ref 65–99)
POTASSIUM: 5.1 mmol/L (ref 3.5–5.2)
Sodium: 143 mmol/L (ref 134–144)
TOTAL PROTEIN: 6.9 g/dL (ref 6.0–8.5)

## 2016-10-31 LAB — CBC WITH DIFFERENTIAL/PLATELET
BASOS ABS: 0 10*3/uL (ref 0.0–0.2)
Basos: 1 %
EOS (ABSOLUTE): 0.1 10*3/uL (ref 0.0–0.4)
Eos: 1 %
HEMOGLOBIN: 12.4 g/dL (ref 11.1–15.9)
Hematocrit: 38.4 % (ref 34.0–46.6)
IMMATURE GRANS (ABS): 0 10*3/uL (ref 0.0–0.1)
IMMATURE GRANULOCYTES: 0 %
LYMPHS: 22 %
Lymphocytes Absolute: 1 10*3/uL (ref 0.7–3.1)
MCH: 31.3 pg (ref 26.6–33.0)
MCHC: 32.3 g/dL (ref 31.5–35.7)
MCV: 97 fL (ref 79–97)
MONOCYTES: 8 %
Monocytes Absolute: 0.4 10*3/uL (ref 0.1–0.9)
NEUTROS ABS: 3.1 10*3/uL (ref 1.4–7.0)
NEUTROS PCT: 68 %
PLATELETS: 338 10*3/uL (ref 150–379)
RBC: 3.96 x10E6/uL (ref 3.77–5.28)
RDW: 13.7 % (ref 12.3–15.4)
WBC: 4.5 10*3/uL (ref 3.4–10.8)

## 2016-10-31 LAB — VITAMIN D 25 HYDROXY (VIT D DEFICIENCY, FRACTURES): VIT D 25 HYDROXY: 27.2 ng/mL — AB (ref 30.0–100.0)

## 2016-10-31 MED ORDER — VITAMIN D (ERGOCALCIFEROL) 1.25 MG (50000 UNIT) PO CAPS: 50000.0000 [IU] | ORAL_CAPSULE | ORAL | 0 refills | Status: DC

## 2016-10-31 NOTE — Telephone Encounter (Signed)
-----   Message from Britt Bottom, MD sent at 10/31/2016  2:00 PM EDT ----- Vitamin D is mildly low. Please: 50,000 units weekly 12 weeks then 5000 units daily over-the-counter. Labs are fine.

## 2016-11-02 ENCOUNTER — Other Ambulatory Visit: Payer: Self-pay | Admitting: Neurology

## 2016-11-06 ENCOUNTER — Encounter: Payer: Self-pay | Admitting: Women's Health

## 2016-11-06 ENCOUNTER — Ambulatory Visit (INDEPENDENT_AMBULATORY_CARE_PROVIDER_SITE_OTHER): Payer: BLUE CROSS/BLUE SHIELD | Admitting: Women's Health

## 2016-11-06 VITALS — BP 126/80 | Ht 64.0 in | Wt 222.0 lb

## 2016-11-06 DIAGNOSIS — N898 Other specified noninflammatory disorders of vagina: Secondary | ICD-10-CM

## 2016-11-06 DIAGNOSIS — N760 Acute vaginitis: Secondary | ICD-10-CM

## 2016-11-06 DIAGNOSIS — N951 Menopausal and female climacteric states: Secondary | ICD-10-CM

## 2016-11-06 DIAGNOSIS — B9689 Other specified bacterial agents as the cause of diseases classified elsewhere: Secondary | ICD-10-CM

## 2016-11-06 LAB — WET PREP FOR TRICH, YEAST, CLUE
Trich, Wet Prep: NONE SEEN
Yeast Wet Prep HPF POC: NONE SEEN

## 2016-11-06 MED ORDER — METRONIDAZOLE 500 MG PO TABS: 500.0000 mg | ORAL_TABLET | Freq: Two times a day (BID) | ORAL | 0 refills | Status: DC

## 2016-11-06 NOTE — Patient Instructions (Signed)

## 2016-11-06 NOTE — Progress Notes (Signed)
Presents with several concerns. 2016 became debilitated with MS. Was unable to walk, wheelchair-bound, had an elevated FSH with amenorrhea .  Started on a drug similar to chemotherapy for her MS, became amenorrheic after starting. MS  much improved, able to walk with minimal assistance of a cane. Medication was stopped 05/2015. Has had 3 cycles since June. Increased vaginal discharge with an odor, no vaginal itching for the past week. Denies abdominal pain, urinary symptoms, or fever. Not sexually active, occasional hot flushes. Looking forward to a long awaited trip to [place] in 2 weeks.  Exam: Appears well, obese. Ambulating well. External genitalia within normal limits, speculum exam scant discharge with odor noted, no visible lesions on cervix, wet prep positive for many clues, TNTC bacteria. Bimanual no CMT or adnexal tenderness.  Bacteria vaginosis History of medication induced menopause  Plan: [facility] pending, reviewed cycles most likely stopped due to chemotherapy. Keep menstrual record. Flagyl 500 twice daily for 7 days, prescription, proper use given and reviewed alcohol precautions. Instructed to call if continued problems with discharge.

## 2016-11-07 ENCOUNTER — Other Ambulatory Visit: Payer: Self-pay | Admitting: Gynecology

## 2016-11-07 DIAGNOSIS — R7989 Other specified abnormal findings of blood chemistry: Secondary | ICD-10-CM

## 2016-11-07 DIAGNOSIS — R891 Abnormal level of hormones in specimens from other organs, systems and tissues: Principal | ICD-10-CM

## 2016-11-07 LAB — FOLLICLE STIMULATING HORMONE: FSH: 56.2 m[IU]/mL

## 2016-11-08 ENCOUNTER — Other Ambulatory Visit: Payer: Self-pay | Admitting: Gynecology

## 2016-11-08 ENCOUNTER — Encounter: Payer: Self-pay | Admitting: Women's Health

## 2016-11-08 DIAGNOSIS — N939 Abnormal uterine and vaginal bleeding, unspecified: Secondary | ICD-10-CM

## 2016-11-13 ENCOUNTER — Encounter: Payer: Self-pay | Admitting: Neurology

## 2016-11-13 ENCOUNTER — Ambulatory Visit (INDEPENDENT_AMBULATORY_CARE_PROVIDER_SITE_OTHER): Payer: BLUE CROSS/BLUE SHIELD | Admitting: Neurology

## 2016-11-13 VITALS — BP 90/62 | HR 64 | Resp 18 | Ht 64.0 in | Wt 222.0 lb

## 2016-11-13 DIAGNOSIS — G35 Multiple sclerosis: Secondary | ICD-10-CM | POA: Diagnosis not present

## 2016-11-13 DIAGNOSIS — R260 Ataxic gait: Secondary | ICD-10-CM | POA: Diagnosis not present

## 2016-11-13 DIAGNOSIS — N39498 Other specified urinary incontinence: Secondary | ICD-10-CM | POA: Diagnosis not present

## 2016-11-13 DIAGNOSIS — Z79899 Other long term (current) drug therapy: Secondary | ICD-10-CM | POA: Diagnosis not present

## 2016-11-13 NOTE — Progress Notes (Signed)
[facility]  PATIENT: Ann Oconnor DOB: 1975-09-18  REFERRING DOCTOR OR PCP:  Delia Chimes SOURCE: Patient, records, images  _________________________________   HISTORICAL  CHIEF COMPLAINT:  Chief Complaint  Patient presents with  . Multiple Sclerosis    Just seen 10/29/16 for f/u MS.  Back today with new c/o gait disturbance--throwing her right foot out a bit when she walks.  Using a cane.  No falls./fim    HISTORY OF PRESENT ILLNESS:  Ann Oconnor is a 41 yo woman with a very aggressive MS.      Update 11/13/2016:   Ann Oconnor was last seen 2 weeks ago. She was stable at that time on ocrelizumab.   Over the weekend, she noted more difficulty with gait.   She notes her right leg is mildly weaker and she is throwing the foot put as she walks.  Balance is mildly worse.   She started using her cane again.    She denies any falls. She denies any fevers or infections.   Bladder function is fine.   Vision is unchanged.   She has fatigue and amantadine was tried after the last visit.   She noted no improvement and stopped.    Adderall was not tolerated in the past.    She sleeps well at night.  At last visit we checked vitamin D. It was low and she is taking high-dose supplements and we'll switch to OTC supplements in a few months.  Update 10/29/2016:  She is a ocrelizumab. Her next infusion is later this week. She has not had any exacerbations while on ocrelizumab and she tolerates it well. Before that she was on Novantrone which helped to stabilize her MS but she needed to switch due to the lifetime limited.  She feels gait has improved further this year. Specifically she is walking well without a cane. She is able to do about 1 mile. She does need to look down at the ground or her feet when she walks for better balance and she did have 1 fall.    She continues to have numbness in both legs that has mildly improved over the last year. Bladder function is doing well and she has  not had any incontinence since her last visit. She continues to experience fatigue that is physical more than cognitive. Adderall made her jittery so she stopped. She does not think she has ever been on amantadine.   __________________________________ From 04/19/2016: MS:    She switched from Novantrone to ocrelizumab in July 2017.     She tolerated the infusion well.  She had a total of 5 Novantrone treatments at 12 mg/M2.     She continues to have some deficits from her severe exacerbations early 2016.                                                                     Gait/strength:  She is able to walk much better and has walked a mie without a cane but that tires her out .   She regularly walks > 100 yards without rest.     She can climb a short flight of stairs.   She is driving now short distances.  She has mild weakness and spasticity  in legs but not arms   Gait issues are due more to balance than strength.     Numbness:   She has bilateral fairly symmetric numbness in her arms and legs since the 2016 exaceration.    Numbness is not complete as it was but is still severe.   She had normal NCV.    Numbness is slightly tingling but not painful.   She is on gabapentin 400 mg twice a day with benefit   Bladder/bowel:   She reports no incontinence since last visit.     No significant frequency.    Vision:  She denies MS related visual issues.  Fatigue/sleep:  She has fatigue most days, worse if more active and worse in the afternoons.   Fatigue is more cognitive many days than physical.    She is able to sleep fairly well at night.  Mood/cognition:   Mood is much better than in 2016 and early 2017.  She has mild depression and anxiety, helped by Paxil.  Her cognitive issues have returned close to baseline.  She notes some verbal fluency issues.    MS:    She presented in 2002 with numbness in her legs and poor gait.  The MRI scan showed MS plaques in the spinal cord and the brain and she was  diagnosed with multiple sclerosis. She was initially placed on Rebif. At first she did well with only a couple of small central exacerbations. However, 2003 she had a more severe exacerbation that affected her gait. She was placed on a combination Rebif and Copaxone for a short period of time and then was on Copaxone monotherapy. She switched to [place] in 2011 due to needle fatigue. She had numbness in her feet that would not go away and was switched to T[place] from [place] in November 2015.  In January 2016, she lost strength in both legs and urinary incontinence. She had a course of IV steroids without any benefit in late January. Because there was no benefit, she was admitted to [facility] for a course of plasmapheresis starting February 5. She got no benefit from the plasmapheresis. During the next week, she was home but she was doing worse and worse. She then presented to [facility]. She was admitted for another week of steroids. An MRI done at admission showed very aggressive MS changes.    Brain 04/28/2014 shows multiple infratentorial plaques including left greater than right middle cerebellar peduncles, bilateral pons, and cerebellum. Additionally there are multiple foci in the hemispheres, some with a concentric demyelinating pattern that could be consistent with Balo's concentric sclerosis, an especially fulminant form of multiple sclerosis. She was unable to complete the examination or get contrast due to the need to terminate the study early.   When compared to an MRI dated 02/07/2012, there has been dramatic change with multiple new brain stem and hemispheric foci.  MRI of the cervical spine 10/26/2010. showed multiple plaques:  right at the C2 level, left aspect C2-3 level, right aspect C3 level, the posterior central aspect C3 level, upper C6 level slightly greater to the right and C7 level greater to the left.  Abnormal signal within the left aspect of the cord at the T3- T4 level.   MRI of the  brain in 05/19/2014 showed several additional posterior fossa lesions prompting initiation of Novantrone.  Marland Kitchen     REVIEW OF SYSTEMS: Constitutional: No fevers, chills, sweats, or change in appetite.  Fatigue Eyes: No visual changes, double vision,  eye pain Ear, nose and throat: No hearing loss, ear pain, nasal congestion, sore throat Cardiovascular: No chest pain, palpitations Respiratory: No shortness of breath at rest or with exertion.   No wheezes GastrointestinaI: No nausea, vomiting.  Some fecal incontinence Genitourinary: as above. Musculoskeletal: No neck pain, back pain Integumentary: No rash, pruritus, skin lesions Neurological: as above Psychiatric: Notes depression at this time.  Some anxiety Endocrine: No palpitations, diaphoresis, change in appetite, change in weigh or increased thirst Hematologic/Lymphatic: No anemia, purpura, petechiae. Allergic/Immunologic: No itchy/runny eyes, nasal congestion, recent allergic reactions, rashes  ALLERGIES: No Known Allergies  HOME MEDICATIONS:  Current Outpatient Prescriptions:  .  acetaminophen (TYLENOL) 500 MG tablet, Take 1,500 mg by mouth daily as needed for moderate pain or headache., Disp: , Rfl:  .  amantadine (SYMMETREL) 100 MG capsule, Take 1 capsule (100 mg total) by mouth 2 (two) times daily., Disp: 60 capsule, Rfl: 5 .  gabapentin (NEURONTIN) 400 MG capsule, TAKE 1 CAPSULE(400 MG) BY MOUTH TWICE DAILY, Disp: 60 capsule, Rfl: 5 .  metroNIDAZOLE (FLAGYL) 500 MG tablet, Take 1 tablet (500 mg total) by mouth 2 (two) times daily., Disp: 14 tablet, Rfl: 0 .  ocrelizumab 600 mg in sodium chloride 0.9 % 500 mL, Inject 600 mg into the vein every 6 (six) months., Disp: , Rfl:  .  PARoxetine (PAXIL-CR) 37.5 MG 24 hr tablet, TAKE 1 TABLET(37.5 MG) BY MOUTH DAILY, Disp: 30 tablet, Rfl: 0 .  rosuvastatin (CRESTOR) 10 MG tablet, Take 10 mg by mouth every evening. , Disp: , Rfl: 5 .  Vitamin D, Ergocalciferol, (DRISDOL) 50000 units CAPS  capsule, Take 1 capsule (50,000 Units total) by mouth every 7 (seven) days. Once done with prescription, take over the counter Vitamin D 5,000u once daily., Disp: 12 capsule, Rfl: 0 No current facility-administered medications for this visit.   Facility-Administered Medications Ordered in Other Visits:  .  gadopentetate dimeglumine (MAGNEVIST) injection 20 mL, 20 mL, Intravenous, Once PRN, Sater, Richard A, MD .  gadopentetate dimeglumine (MAGNEVIST) injection 20 mL, 20 mL, Intravenous, Once PRN, Sater, Nanine Means, MD  PAST MEDICAL HISTORY: Past Medical History:  Diagnosis Date  . Anxiety   . Family history of adverse reaction to anesthesia    Father - difficulty awaken- has sleep apena  . Gait instability   . Head injury, closed, with brief LOC ([place]) 2003   fall  . Incontinence   . 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  Colpo normal with neg ECC  . Movement disorder   . MS (multiple sclerosis) ([place]) 2002  . Seizures ([place]) 2003   none since 2003  . UTI (lower urinary tract infection)   . Vision abnormalities     PAST SURGICAL HISTORY: Past Surgical History:  Procedure Laterality Date  . CERVICAL CONE BIOPSY  2008   CIN 2  . COLONOSCOPY    . PORT-A-CATH REMOVAL N/A 02/16/2016   Procedure: REMOVAL PORT-A-CATH;  Surgeon: Donnie Mesa, MD;  Location: [facility];  Service: General;  Laterality: N/A;  . PORTACATH PLACEMENT Left 08/18/14  . PORTACATH PLACEMENT Left 08/18/2014   Procedure: LEFT SUBCLAVIAN VEIN PORT PLACEMENT;  Surgeon: Donnie Mesa, MD;  Location: [facility];  Service: General;  Laterality: Left;    FAMILY HISTORY: Family History  Problem Relation Age of Onset  . Thyroid disease Mother   . Heart disease Father   . Mitral valve prolapse Father   . Thyroid disease Sister   .  Cancer Paternal Aunt        not sure what kind, maybe cervix  . Thyroid disease Sister     SOCIAL HISTORY:  Social History   Social  History  . Marital status: Single    Spouse name: N/A  . Number of children: N/A  . Years of education: N/A   Occupational History  . Not on file.   Social History Main Topics  . Smoking status: Never Smoker  . Smokeless tobacco: Never Used  . Alcohol use No  . Drug use: No  . Sexual activity: No   Other Topics Concern  . Not on file   Social History Narrative  . No narrative on file     PHYSICAL EXAM  Vitals:   11/13/16 1532  BP: 90/62  Pulse: 64  Resp: 18  Weight: 222 lb (100.7 kg)  Height: 5\' 4"  (1.626 m)    Body mass index is 38.11 kg/m.   General: The patient is well-developed and well-nourished and in no acute distress   Neurologic Exam  Mental status: The patient is alert and oriented x 3 at the time of the examination. The patient has apparent normal recent and remote memory,  attention span and concentration ability now seem normal.   Speech is normal.     Cranial nerves: Extraocular movements are full. Facial strength and sensation is normal. No dysarthria is noted.  No obvious hearing deficits are noted.  Motor:  Muscle bulk is normal.   Tone is mildly increased in legs.. Strength is  5/5 in arms and legs now.  Sensory: Sensory testing is intact to pinprick, soft touch and vibration sensation in arms.   She has slight decreased touch sensation on the left. than the right.   Coordination: Cerebellar testing reveals mild reduced right finger-nose-finger and slightly reduced heel-to-shin bilaterally.  Gait and station: She has a normal station. She has a mild left foot drop and gait is wider than it was at the last visit. She cannot do a tandem gait.   There is no Romberg sign. Gait shows mild left foot drop and slightly reduced stride.   No Romberg sign  Reflexes: Deep tendon reflexes are increased bilaterally, slightly more on the right.        DIAGNOSTIC DATA (LABS, IMAGING, TESTING) - I reviewed patient records, labs, notes, testing and imaging  myself where available.  Lab Results  Component Value Date   WBC 4.5 10/30/2016   HGB 12.4 10/30/2016   HCT 38.4 10/30/2016   MCV 97 10/30/2016   PLT 338 10/30/2016      Component Value Date/Time   NA 143 10/30/2016 0948   K 5.1 10/30/2016 0948   CL 104 10/30/2016 0948   CO2 22 10/30/2016 0948   GLUCOSE 96 10/30/2016 0948   GLUCOSE 86 02/16/2016 0851   BUN 6 10/30/2016 0948   CREATININE 0.67 10/30/2016 0948   CREATININE 0.75 09/02/2012 1057   CALCIUM 9.1 10/30/2016 0948   PROT 6.9 10/30/2016 0948   ALBUMIN 4.5 10/30/2016 0948   AST 20 10/30/2016 0948   ALT 8 10/30/2016 0948   ALKPHOS 82 10/30/2016 0948   BILITOT 0.3 10/30/2016 0948   GFRNONAA 109 10/30/2016 0948   GFRAA 125 10/30/2016 0948   Lab Results  Component Value Date   CHOL 143 10/30/2016   HDL 59 10/30/2016   LDLCALC 68 10/30/2016   TRIG 78 10/30/2016   CHOLHDL 2.4 10/30/2016   No results found for: HGBA1C Lab Results  Component Value Date   [id number] 664 04/04/2015   Lab Results  Component Value Date   TSH 2.871 08/21/2011       ASSESSMENT AND PLAN  Multiple sclerosis ([place]) - Plan: Urinalysis, Routine w reflex microscopic, Culture, Urine  Ataxic gait  Other urinary incontinence - Plan: Urinalysis, Routine w reflex microscopic, Culture, Urine  High risk medication use   1.    She appears to be having a small exacerbation that is affecting gait. Therefore, I think we should go ahead and do a couple days of IV steroids. I will check a urinalysis and urine culture to make sure that she does not have a UTI that could also be causing a pseudo-exacerbation.   2.   Continue vitamin D supplements 3.   Return in 5-6 months but call sooner if there are new or worsening neurologic symptoms.  Richard A. Felecia Shelling, MD, PhD 06/05/4740, 5:95 PM Certified in Neurology, Clinical Neurophysiology, Sleep Medicine, Pain Medicine and Neuroimaging  [facility] [address] [place], [place] [address] [phone]  BSA = 2.28  Dose 12 mg/M2 = 27.35yt                                                                                                                                                                                           88

## 2016-11-14 ENCOUNTER — Telehealth: Payer: Self-pay | Admitting: *Deleted

## 2016-11-14 LAB — URINALYSIS, ROUTINE W REFLEX MICROSCOPIC
BILIRUBIN UA: NEGATIVE
GLUCOSE, UA: NEGATIVE
KETONES UA: NEGATIVE
NITRITE UA: POSITIVE — AB
PROTEIN UA: NEGATIVE
RBC, UA: NEGATIVE
SPEC GRAV UA: 1.007 (ref 1.005–1.030)
UUROB: 0.2 mg/dL (ref 0.2–1.0)
pH, UA: 6 (ref 5.0–7.5)

## 2016-11-14 LAB — MICROSCOPIC EXAMINATION: Casts: NONE SEEN /lpf

## 2016-11-14 MED ORDER — SULFAMETHOXAZOLE-TRIMETHOPRIM 800-160 MG PO TABS: 1.0000 | ORAL_TABLET | Freq: Two times a day (BID) | ORAL | 0 refills | Status: DC

## 2016-11-14 NOTE — Telephone Encounter (Signed)
-----   Message from Britt Bottom, MD sent at 11/14/2016  1:03 PM EDT ----- Please let her know that the urine had white blood cells so she probably has a UTI. Call in Bactrim DS 1 by mouth twice a day 7 days. Culture is pending and we may need to change antibiotics if the bacteria is not sensitive

## 2016-11-14 NOTE — Telephone Encounter (Signed)
I have spoken with Ann Oconnor this afternoon and reviewed u/a results with her.  She verbalized understanding of same, is agreeable to taking an antibiotic.  Bactrim rx. escribed to [facility] on [address] per her request/fim

## 2016-11-15 LAB — URINE CULTURE

## 2016-11-19 ENCOUNTER — Encounter: Payer: Self-pay | Admitting: Gynecology

## 2016-11-19 ENCOUNTER — Ambulatory Visit (INDEPENDENT_AMBULATORY_CARE_PROVIDER_SITE_OTHER): Payer: BLUE CROSS/BLUE SHIELD | Admitting: Gynecology

## 2016-11-19 ENCOUNTER — Ambulatory Visit (INDEPENDENT_AMBULATORY_CARE_PROVIDER_SITE_OTHER): Payer: BLUE CROSS/BLUE SHIELD

## 2016-11-19 ENCOUNTER — Other Ambulatory Visit: Payer: Self-pay | Admitting: Gynecology

## 2016-11-19 VITALS — BP 120/78

## 2016-11-19 DIAGNOSIS — R891 Abnormal level of hormones in specimens from other organs, systems and tissues: Secondary | ICD-10-CM

## 2016-11-19 DIAGNOSIS — N939 Abnormal uterine and vaginal bleeding, unspecified: Secondary | ICD-10-CM | POA: Diagnosis not present

## 2016-11-19 DIAGNOSIS — N83202 Unspecified ovarian cyst, left side: Secondary | ICD-10-CM | POA: Diagnosis not present

## 2016-11-19 DIAGNOSIS — R7989 Other specified abnormal findings of blood chemistry: Secondary | ICD-10-CM

## 2016-11-19 DIAGNOSIS — N950 Postmenopausal bleeding: Secondary | ICD-10-CM

## 2016-11-19 NOTE — Patient Instructions (Signed)
Office will call you with biopsy results 

## 2016-11-19 NOTE — Progress Notes (Signed)
    Ann Oconnor 1975/06/20 [id number]        41 y.o.  G0P0 presents for sonohysterogram. History of elevated FSH with amenorrhea. Over the last year she has had 3 separate menses monthly since June. Repeat [place] 56 this month.  Past medical history,surgical history, problem list, medications, allergies, family history and social history were all reviewed and documented in the EPIC chart.  Directed ROS with pertinent positives and negatives documented in the history of present illness/assessment and plan.  Exam: [place] assistant There were no vitals filed for this visit. General appearance:  Normal Abdomen soft nontender without masses guarding rebound Pelvic external BUS vagina normal. Cervix normal. Uterus grossly normal midline mobile nontender. Adnexa without masses or tenderness.  Ultrasound:  Transvaginal and transabdominal shows uterus anteverted, normal size with homogeneous echotexture. Endometrial echo 2.2 mm. Left ovary with cystic mass 24 x 18 x 19 mm with a reticular echo pattern. Negative flow. Arterial blood flow noted to the left ovary. Right ovary normal. Cul-de-sac negative  Sonohysterogram performed, sterile technique, easy catheter introduction, good distention with no abnormalities. Endometrial sample taken. Scant return consistent with thin endometrial echo. Patient tolerated well.  Assessment/Plan:  41 y.o. G0P0 with regular monthly menses 3. [place] 2 following amenorrhea thought to represent premature menopause. Ultrasound shows thin endometrium. Hemorrhagic appearing cyst left ovary consistent with ovulation. Reviewed with patient fluctuation can occur with ovarian function I think that her ovaries are starting to function again despite her elevated FSH. Will follow up for the biopsy results which I suspect will be scant/possibly inadequate given the thin endometrial echo. Will keep menstrual calendar as long as regular menses-like bleed every month or less frequent but  no prolonged or atypical bleeding then will follow for now. Birth control is not an issue as she is not sexually active.   Anastasio Auerbach MD, 10:00 AM 11/19/2016

## 2016-12-01 ENCOUNTER — Other Ambulatory Visit: Payer: Self-pay | Admitting: Neurology

## 2016-12-28 ENCOUNTER — Other Ambulatory Visit: Payer: Self-pay | Admitting: Neurology

## 2017-01-20 ENCOUNTER — Other Ambulatory Visit: Payer: Self-pay | Admitting: Neurology

## 2017-02-28 ENCOUNTER — Other Ambulatory Visit: Payer: Self-pay | Admitting: Neurology

## 2017-03-01 ENCOUNTER — Other Ambulatory Visit: Payer: Self-pay | Admitting: Neurology

## 2017-05-01 ENCOUNTER — Encounter: Payer: Self-pay | Admitting: Neurology

## 2017-05-01 ENCOUNTER — Encounter: Payer: Self-pay | Admitting: *Deleted

## 2017-05-01 ENCOUNTER — Other Ambulatory Visit: Payer: Self-pay

## 2017-05-01 ENCOUNTER — Ambulatory Visit: Payer: BLUE CROSS/BLUE SHIELD | Admitting: Neurology

## 2017-05-01 VITALS — BP 101/65 | HR 64 | Resp 18 | Ht 64.0 in | Wt 234.0 lb

## 2017-05-01 DIAGNOSIS — E559 Vitamin D deficiency, unspecified: Secondary | ICD-10-CM

## 2017-05-01 DIAGNOSIS — Z79899 Other long term (current) drug therapy: Secondary | ICD-10-CM

## 2017-05-01 DIAGNOSIS — R260 Ataxic gait: Secondary | ICD-10-CM | POA: Diagnosis not present

## 2017-05-01 DIAGNOSIS — G35 Multiple sclerosis: Secondary | ICD-10-CM | POA: Diagnosis not present

## 2017-05-01 NOTE — Progress Notes (Signed)
[facility]  PATIENT: Ann Oconnor DOB: 1975/06/28  REFERRING DOCTOR OR PCP:  Delia Chimes SOURCE: Patient, records, images  _________________________________   HISTORICAL  CHIEF COMPLAINT:  Chief Complaint  Patient presents with  . Multiple Sclerosis    Last Ocrevus infusion was this month.  Denies new or worsening sx./fim    HISTORY OF PRESENT ILLNESS:  Ann Oconnor is a 42 yo woman with a very aggressive MS.      Update 05/01/2017:  She had a small exacerbation in September 2018 with right leg weakness. She received a couple days of IV steroids and improved. She now feels that she is doing well. Her last (this infusion was just this month. She is tolerating it well. Before that she was on Novantrone and before that [place].  She is walking better now and could walk a mile without stopping.   She is not using her cane.    Balance is mildly wobbly at times but better than in September.       Vision is doing well.    Bladder is now doing well without any med's.    She is sleeping well at night and her fatigue is mild.      Mood is doing well and she is on Prozac.Marland Kitchen      She takes vitamin D supplements for her low vitamin D.  Update 11/13/2016:   Ann Oconnor was last seen 2 weeks ago. She was stable at that time on ocrelizumab.   Over the weekend, she noted more difficulty with gait.   She notes her right leg is mildly weaker and she is throwing the foot put as she walks.  Balance is mildly worse.   She started using her cane again.    She denies any falls. She denies any fevers or infections.   Bladder function is fine.   Vision is unchanged.   She has fatigue and amantadine was tried after the last visit.   She noted no improvement and stopped.    Adderall was not tolerated in the past.    She sleeps well at night.  At last visit we checked vitamin D. It was low and she is taking high-dose supplements and we'll switch to OTC supplements in a few months.  Update  10/29/2016:  She is a ocrelizumab. Her next infusion is later this week. She has not had any exacerbations while on ocrelizumab and she tolerates it well. Before that she was on Novantrone which helped to stabilize her MS but she needed to switch due to the lifetime limited.  She feels gait has improved further this year. Specifically she is walking well without a cane. She is able to do about 1 mile. She does need to look down at the ground or her feet when she walks for better balance and she did have 1 fall.    She continues to have numbness in both legs that has mildly improved over the last year. Bladder function is doing well and she has not had any incontinence since her last visit. She continues to experience fatigue that is physical more than cognitive. Adderall made her jittery so she stopped. She does not think she has ever been on amantadine.   __________________________________ From 04/19/2016: MS:    She switched from Novantrone to ocrelizumab in July 2017.     She tolerated the infusion well.  She had a total of 5 Novantrone treatments at 12 mg/M2.     She continues  to have some deficits from her severe exacerbations early 2016.                                                                     Gait/strength:  She is able to walk much better and has walked a mie without a cane but that tires her out .   She regularly walks > 100 yards without rest.     She can climb a short flight of stairs.   She is driving now short distances.  She has mild weakness and spasticity in legs but not arms   Gait issues are due more to balance than strength.     Numbness:   She has bilateral fairly symmetric numbness in her arms and legs since the 2016 exaceration.    Numbness is not complete as it was but is still severe.   She had normal NCV.    Numbness is slightly tingling but not painful.   She is on gabapentin 400 mg twice a day with benefit   Bladder/bowel:   She reports no incontinence since last  visit.     No significant frequency.    Vision:  She denies MS related visual issues.  Fatigue/sleep:  She has fatigue most days, worse if more active and worse in the afternoons.   Fatigue is more cognitive many days than physical.    She is able to sleep fairly well at night.  Mood/cognition:   Mood is much better than in 2016 and early 2017.  She has mild depression and anxiety, helped by Paxil.  Her cognitive issues have returned close to baseline.  She notes some verbal fluency issues.    MS:    She presented in 2002 with numbness in her legs and poor gait.  The MRI scan showed MS plaques in the spinal cord and the brain and she was diagnosed with multiple sclerosis. She was initially placed on Rebif. At first she did well with only a couple of small central exacerbations. However, 2003 she had a more severe exacerbation that affected her gait. She was placed on a combination Rebif and Copaxone for a short period of time and then was on Copaxone monotherapy. She switched to [place] in 2011 due to needle fatigue. She had numbness in her feet that would not go away and was switched to Tecf[place]a from [place] in November 2015.  In January 2016, she lost strength in both legs and urinary incontinence. She had a course of IV steroids without any benefit in late January. Because there was no benefit, she was admitted to [facility] for a course of plasmapheresis starting February 5. She got no benefit from the plasmapheresis. During the next week, she was home but she was doing worse and worse. She then presented to [facility]. She was admitted for another week of steroids. An MRI done at admission showed very aggressive MS changes.    Brain 04/28/2014 shows multiple infratentorial plaques including left greater than right middle cerebellar peduncles, bilateral pons, and cerebellum. Additionally there are multiple foci in the hemispheres, some with a concentric demyelinating pattern that could be consistent  with Balo's concentric sclerosis, an especially fulminant form of multiple sclerosis. She was unable to complete the examination  or get contrast due to the need to terminate the study early.   When compared to an MRI dated 02/07/2012, there has been dramatic change with multiple new brain stem and hemispheric foci.  MRI of the cervical spine 10/26/2010. showed multiple plaques:  right at the C2 level, left aspect C2-3 level, right aspect C3 level, the posterior central aspect C3 level, upper C6 level slightly greater to the right and C7 level greater to the left.  Abnormal signal within the left aspect of the cord at the T3- T4 level.   MRI of the brain in 05/19/2014 showed several additional posterior fossa lesions prompting initiation of Novantrone.  Marland Kitchen     REVIEW OF SYSTEMS: Constitutional: No fevers, chills, sweats, or change in appetite.  Fatigue Eyes: No visual changes, double vision, eye pain Ear, nose and throat: No hearing loss, ear pain, nasal congestion, sore throat Cardiovascular: No chest pain, palpitations Respiratory: No shortness of breath at rest or with exertion.   No wheezes GastrointestinaI: No nausea, vomiting.  Some fecal incontinence Genitourinary: as above. Musculoskeletal: No neck pain, back pain Integumentary: No rash, pruritus, skin lesions Neurological: as above Psychiatric: Notes depression at this time.  Some anxiety Endocrine: No palpitations, diaphoresis, change in appetite, change in weigh or increased thirst Hematologic/Lymphatic: No anemia, purpura, petechiae. Allergic/Immunologic: No itchy/runny eyes, nasal congestion, recent allergic reactions, rashes  ALLERGIES: No Known Allergies  HOME MEDICATIONS:  Current Outpatient Medications:  .  acetaminophen (TYLENOL) 500 MG tablet, Take 1,500 mg by mouth daily as needed for moderate pain or headache., Disp: , Rfl:  .  gabapentin (NEURONTIN) 400 MG capsule, TAKE 1 CAPSULE(400 MG) BY MOUTH TWICE DAILY, Disp: 60  capsule, Rfl: 5 .  ocrelizumab 600 mg in sodium chloride 0.9 % 500 mL, Inject 600 mg into the vein every 6 (six) months., Disp: , Rfl:  .  PARoxetine (PAXIL-CR) 37.5 MG 24 hr tablet, TAKE 1 TABLET(37.5 MG) BY MOUTH DAILY, Disp: 30 tablet, Rfl: 9 .  rosuvastatin (CRESTOR) 10 MG tablet, Take 10 mg by mouth every evening. , Disp: , Rfl: 5 .  amantadine (SYMMETREL) 100 MG capsule, Take 1 capsule (100 mg total) by mouth 2 (two) times daily., Disp: 60 capsule, Rfl: 5 .  sulfamethoxazole-trimethoprim (BACTRIM DS,SEPTRA DS) 800-160 MG tablet, Take 1 tablet by mouth 2 (two) times daily. (Patient not taking: Reported on 11/19/2016), Disp: 14 tablet, Rfl: 0 .  Vitamin D, Ergocalciferol, (DRISDOL) 50000 units CAPS capsule, Take 1 capsule (50,000 Units total) by mouth every 7 (seven) days. Once done with prescription, take over the counter Vitamin D 5,000u once daily., Disp: 12 capsule, Rfl: 0 No current facility-administered medications for this visit.   Facility-Administered Medications Ordered in Other Visits:  .  gadopentetate dimeglumine (MAGNEVIST) injection 20 mL, 20 mL, Intravenous, Once PRN, Sater, Richard A, MD .  gadopentetate dimeglumine (MAGNEVIST) injection 20 mL, 20 mL, Intravenous, Once PRN, Sater, Nanine Means, MD  PAST MEDICAL HISTORY: Past Medical History:  Diagnosis Date  . Anxiety   . Family history of adverse reaction to anesthesia    Father - difficulty awaken- has sleep apena  . Gait instability   . Head injury, closed, with brief LOC ([place]) 2003   fall  . Incontinence   . 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  Colpo normal with neg ECC  . Movement disorder   . MS (multiple sclerosis) ([place]) 2002  . Seizures ([place]) 2003   none  since 2003  . UTI (lower urinary tract infection)   . Vision abnormalities     PAST SURGICAL HISTORY: Past Surgical History:  Procedure Laterality Date  . CERVICAL CONE BIOPSY  2008   CIN 2    . COLONOSCOPY    . PORT-A-CATH REMOVAL N/A 02/16/2016   Procedure: REMOVAL PORT-A-CATH;  Surgeon: Donnie Mesa, MD;  Location: [facility];  Service: General;  Laterality: N/A;  . PORTACATH PLACEMENT Left 08/18/14  . PORTACATH PLACEMENT Left 08/18/2014   Procedure: LEFT SUBCLAVIAN VEIN PORT PLACEMENT;  Surgeon: Donnie Mesa, MD;  Location: [facility] OR;  Service: General;  Laterality: Left;    FAMILY HISTORY: Family History  Problem Relation Age of Onset  . Thyroid disease Mother   . Heart disease Father   . Mitral valve prolapse Father   . Thyroid disease Sister   . Cancer Paternal Aunt        not sure what kind, maybe cervix  . Thyroid disease Sister     SOCIAL HISTORY:  Social History   Socioeconomic History  . Marital status: Single    Spouse name: Not on file  . Number of children: Not on file  . Years of education: Not on file  . Highest education level: Not on file  Social Needs  . Financial resource strain: Not on file  . Food insecurity - worry: Not on file  . Food insecurity - inability: Not on file  . Transportation needs - medical: Not on file  . Transportation needs - non-medical: Not on file  Occupational History  . Not on file  Tobacco Use  . Smoking status: Never Smoker  . Smokeless tobacco: Never Used  Substance and Sexual Activity  . Alcohol use: No  . Drug use: No  . Sexual activity: No    Birth control/protection: None  Other Topics Concern  . Not on file  Social History Narrative  . Not on file     PHYSICAL EXAM  Vitals:   05/01/17 1112  BP: 101/65  Pulse: 64  Resp: 18  Weight: 234 lb (106.1 kg)  Height: 5\' 4"  (1.626 m)    Body mass index is 40.17 kg/m.   General: The patient is well-developed and well-nourished and in no acute distress   Neurologic Exam  Mental status: The patient is alert and oriented x 3 at the time of the examination. The patient has apparent normal recent and remote memory,  attention span and concentration  ability now seem normal.   Speech is normal.     Cranial nerves: Extraocular movements are full. Facial strength and sensation is normal. No dysarthria is noted.  No obvious hearing deficits are noted.  Motor:  Muscle bulk is normal.   Tone is mildly increased in legs.. Strength is  5/5 in arms and legs now.  Sensory: Sensory testing is intact to pinprick, soft touch and vibration sensation in arms.   Currently touch and vibration sensation is symmetric in the legs.   Coordination: Cerebellar testing reveals good finger-nose-finger but mildly reduced heel-to-shin bilaterally.   Gait and station: She has a normal station. Gait is mildly wide. She turns well. The tandem gait is moderately wide.   Romberg is negative.  Reflexes: Deep tendon reflexes are increased bilaterally, slightly more on the right.        DIAGNOSTIC DATA (LABS, IMAGING, TESTING) - I reviewed patient records, labs, notes, testing and imaging myself where available.  Lab Results  Component Value Date   WBC  4.5 10/30/2016   HGB 12.4 10/30/2016   HCT 38.4 10/30/2016   MCV 97 10/30/2016   PLT 338 10/30/2016      Component Value Date/Time   NA 143 10/30/2016 0948   K 5.1 10/30/2016 0948   CL 104 10/30/2016 0948   CO2 22 10/30/2016 0948   GLUCOSE 96 10/30/2016 0948   GLUCOSE 86 02/16/2016 0851   BUN 6 10/30/2016 0948   CREATININE 0.67 10/30/2016 0948   CREATININE 0.75 09/02/2012 1057   CALCIUM 9.1 10/30/2016 0948   PROT 6.9 10/30/2016 0948   ALBUMIN 4.5 10/30/2016 0948   AST 20 10/30/2016 0948   ALT 8 10/30/2016 0948   ALKPHOS 82 10/30/2016 0948   BILITOT 0.3 10/30/2016 0948   GFRNONAA 109 10/30/2016 0948   GFRAA 125 10/30/2016 0948   Lab Results  Component Value Date   CHOL 143 10/30/2016   HDL 59 10/30/2016   LDLCALC 68 10/30/2016   TRIG 78 10/30/2016   CHOLHDL 2.4 10/30/2016   No results found for: HGBA1C Lab Results  Component Value Date   VITAMINB12 348 04/04/2015   Lab Results  Component  Value Date   TSH 2.871 08/21/2011       ASSESSMENT AND PLAN  Multiple sclerosis ([place]) - Plan: CBC with Differential/Platelet, IgG, IgA, IgM  High risk medication use - Plan: CBC with Differential/Platelet, IgG, IgA, IgM  Ataxic gait  Vitamin D deficiency   1.   Her MS is currently doing well. We will continue ocrelizumab. I will check a CBC and IgG/IgM/IgA today.  2.   Continue vitamin D supplements 3.   Return in 5-6 months but call sooner if there are new or worsening neurologic symptoms.  Richard A. Felecia Shelling, MD, PhD 9/56/2130, 86:57 AM Certified in Neurology, Clinical Neurophysiology, Sleep Medicine, Pain Medicine and Neuroimaging  [facility] [address] [place], [place] [address] [phone]  BSA = 2.28  Dose 12 mg/M2 = 27.35yt                                                                                                                                                                                           88

## 2017-05-02 LAB — CBC WITH DIFFERENTIAL/PLATELET
BASOS: 1 %
Basophils Absolute: 0 10*3/uL (ref 0.0–0.2)
EOS (ABSOLUTE): 0.1 10*3/uL (ref 0.0–0.4)
EOS: 2 %
HEMOGLOBIN: 11.8 g/dL (ref 11.1–15.9)
Hematocrit: 35.6 % (ref 34.0–46.6)
IMMATURE GRANS (ABS): 0 10*3/uL (ref 0.0–0.1)
IMMATURE GRANULOCYTES: 0 %
LYMPHS: 22 %
Lymphocytes Absolute: 0.9 10*3/uL (ref 0.7–3.1)
MCH: 30.3 pg (ref 26.6–33.0)
MCHC: 33.1 g/dL (ref 31.5–35.7)
MCV: 92 fL (ref 79–97)
MONOCYTES: 11 %
Monocytes Absolute: 0.5 10*3/uL (ref 0.1–0.9)
NEUTROS ABS: 2.7 10*3/uL (ref 1.4–7.0)
NEUTROS PCT: 64 %
Platelets: 309 10*3/uL (ref 150–379)
RBC: 3.89 x10E6/uL (ref 3.77–5.28)
RDW: 13.9 % (ref 12.3–15.4)
WBC: 4.2 10*3/uL (ref 3.4–10.8)

## 2017-05-02 LAB — IGG, IGA, IGM
IGM (IMMUNOGLOBULIN M), SRM: 14 mg/dL — AB (ref 26–217)
IgA/Immunoglobulin A, Serum: 55 mg/dL — ABNORMAL LOW (ref 87–352)
IgG (Immunoglobin G), Serum: 802 mg/dL (ref 700–1600)

## 2017-06-12 ENCOUNTER — Other Ambulatory Visit: Payer: Self-pay | Admitting: Women's Health

## 2017-06-12 DIAGNOSIS — Z1231 Encounter for screening mammogram for malignant neoplasm of breast: Secondary | ICD-10-CM

## 2017-06-24 ENCOUNTER — Encounter: Payer: Self-pay | Admitting: Women's Health

## 2017-06-24 NOTE — Telephone Encounter (Signed)
It looks like she is having some sporadic ovulations.  Given her full workup in September do not think we need to do more at this point.  Watch the bleeding and as long as it appears to be a "normal" menses then follow.  We generally use 1 year timeframe from a workup before reinitiating reevaluation.  If she would go into the summer and still have some bleeding then at that point I would recommend reexamination and possible repeat ultrasound/biopsy at that time.

## 2017-07-10 ENCOUNTER — Ambulatory Visit: Payer: BLUE CROSS/BLUE SHIELD

## 2017-08-20 ENCOUNTER — Encounter: Payer: BLUE CROSS/BLUE SHIELD | Admitting: Women's Health

## 2017-09-03 ENCOUNTER — Other Ambulatory Visit: Payer: Self-pay | Admitting: Neurology

## 2017-09-12 ENCOUNTER — Encounter: Payer: Self-pay | Admitting: Neurology

## 2017-09-13 NOTE — Telephone Encounter (Addendum)
Submitted PA paxil ER 37.5 mg to covermymeds. Key: [id number]. Pt has been on this for many years and stable, no SE. Dx: depression. Waiting on determination from [facility]. Tried/failed: Prozac and Paxil regular release.   "[employer] is reviewing your PA request. Typically an electronic response will be received within 72 hours"

## 2017-09-13 NOTE — Telephone Encounter (Signed)
PA approved. Request Reference Number: [id number]. PAROXETIN ER TAB 37.5MG  is approved through 03/04/2018. For further questions, call [phone].

## 2017-09-17 ENCOUNTER — Ambulatory Visit: Payer: Medicare Other | Admitting: Women's Health

## 2017-09-17 ENCOUNTER — Encounter: Payer: Self-pay | Admitting: Women's Health

## 2017-09-17 VITALS — BP 118/74 | Ht 64.25 in | Wt 217.0 lb

## 2017-09-17 DIAGNOSIS — Z01419 Encounter for gynecological examination (general) (routine) without abnormal findings: Secondary | ICD-10-CM

## 2017-09-17 DIAGNOSIS — Z1151 Encounter for screening for human papillomavirus (HPV): Secondary | ICD-10-CM | POA: Diagnosis not present

## 2017-09-17 NOTE — Addendum Note (Signed)
Addended by: Thurnell Garbe A on: 09/17/2017 02:56 PM   Modules accepted: Orders

## 2017-09-17 NOTE — Progress Notes (Signed)
[place] [birth year] [id number]    History:    Presents for annual exam.  Postmenopausal on no HRT with no bleeding.  Not sexually active in years.  11/2016- sonohysterogram for postmenopausal bleeding.  2017 LGSIL with negative biopsy.  Normal mammogram history.  2016 diagnosed with multiple sclerosis. Primary care manages  hypercholesteremia, anxiety and depression.  Denies any urinary incontinence or bladder issues.  Past medical history, past surgical history, family history and social history were all reviewed and documented in the EPIC chart.  On disability, lives close to her parents.  States has difficulty with memory and self-expression.  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  ROS:  A ROS was performed and pertinent positives and negatives are included.  Exam:  Vitals:   09/17/17 1409  BP: 118/74  Weight: 217 lb (98.4 kg)  Height: 5' 4.25" (1.632 m)   Body mass index is 36.96 kg/m.   General appearance:  Normal Thyroid:  Symmetrical, normal in size, without palpable masses or nodularity. Respiratory  Auscultation:  Clear without wheezing or rhonchi Cardiovascular  Auscultation:  Regular rate, without rubs, murmurs or gallops  Edema/varicosities:  Not grossly evident Abdominal  Soft,nontender, without masses, guarding or rebound.  Liver/spleen:  No organomegaly noted  Hernia:  None appreciated  Skin  Inspection:  Grossly normal   Breasts: Examined lying and sitting.     Right: Without masses, retractions, discharge or axillary adenopathy.     Left: Without masses, retractions, discharge or axillary adenopathy. Gentitourinary   Inguinal/mons:  Normal without inguinal adenopathy  External genitalia:  Normal  BUS/Urethra/Skene's glands:  Normal  Vagina:  Normal atrophic  Cervix:  Normal  Uterus:  normal in size, shape and contour.  Midline and mobile  Adnexa/parametria:     Rt: Without masses or  tenderness.   Lt: Without masses or tenderness.  Anus and perineum: Normal  Digital rectal exam: Normal sphincter tone without palpated masses or tenderness  Assessment/Plan:  42 y.o. S WF G0 for breast and pelvic exam with no complaints.  Postmenopausal/no HRT/no bleeding 2017 LGSIL with negative biopsy MS-neurologist manages Hypercholesteremia, anxiety depression-primary care manages labs and meds Obesity  Plan: Instructed to call if any bleeding. Encouraged to get a baseline DEXA at primary care.  SBE's, annual screening mammogram overdue instructed to schedule.  Continue healthy lifestyle of regular exercise, decrease calorie/carbs for weight loss encouraged.  Pap with HR HPV typing.   Huel Cote [facility], 2:25 PM 09/17/2017

## 2017-09-17 NOTE — Patient Instructions (Signed)

## 2017-09-19 LAB — PAP, TP IMAGING W/ HPV RNA, RFLX HPV TYPE 16,18/45: HPV DNA HIGH RISK: NOT DETECTED

## 2017-09-30 ENCOUNTER — Other Ambulatory Visit: Payer: Self-pay | Admitting: Neurology

## 2017-10-10 ENCOUNTER — Ambulatory Visit
Admission: RE | Admit: 2017-10-10 | Discharge: 2017-10-10 | Disposition: A | Discharge status: Home / Self Care | Payer: Medicare Other | Source: Ambulatory Visit | Attending: Women's Health | Admitting: Women's Health

## 2017-10-10 DIAGNOSIS — Z1231 Encounter for screening mammogram for malignant neoplasm of breast: Secondary | ICD-10-CM

## 2017-10-11 ENCOUNTER — Ambulatory Visit: Payer: Medicare Other

## 2017-10-22 ENCOUNTER — Ambulatory Visit: Payer: Medicare Other | Admitting: Gynecology

## 2017-10-22 ENCOUNTER — Encounter: Payer: Self-pay | Admitting: Gynecology

## 2017-10-22 VITALS — BP 118/76

## 2017-10-22 DIAGNOSIS — N870 Mild cervical dysplasia: Secondary | ICD-10-CM

## 2017-10-22 DIAGNOSIS — R87612 Low grade squamous intraepithelial lesion on cytologic smear of cervix (LGSIL): Secondary | ICD-10-CM

## 2017-10-22 NOTE — Progress Notes (Signed)
    Ann Oconnor 1976-02-11 [id number]        42 y.o.  G0P0 presents for colposcopy.  Patient has a long history of persistent low-grade dysplasia as follows:  2008 cone biopsy for CIN-2 2012 CIN-1 on colposcopic biopsy negative ECC 2013 ASCUS positive high risk HPV with follow-up biopsy LGSIL 2014 LGSIL with positive high risk HPV 2017 LGSIL with colposcopy normal negative ECC 09/2017 Pap smear LGSIL with negative high risk HPV  Past medical history,surgical history, problem list, medications, allergies, family history and social history were all reviewed and documented in the EPIC chart.  Directed ROS with pertinent positives and negatives documented in the history of present illness/assessment and plan.  Exam: Ann Oconnor assistant Vitals:   10/22/17 0956  BP: 118/76   General appearance:  Normal Abdomen soft nontender without masses guarding rebound Pelvic external BUS vagina normal.  Cervix with mild scarring.  Bimanual without gross masses or tenderness.  Colposcopy performed after acetic acid cleanse is adequate with transformation zone seen 360 degrees.  No abnormalities visualized.  ECC performed.  Patient tolerated well.  Assessment/Plan:  42 y.o. G0P0 persistent low-grade dysplasia as outlined above.  Colposcopy is adequate normal.  ECC performed.  The patient I discussed again the whole issue of dysplasia high-grade versus low-grade, progression versus regression and the HPV association.  At this point if ECC negative or low-grade then plan expectant management with follow-up Pap smear 1 year.  If otherwise then will triaged based upon results.   Anastasio Auerbach MD, 10:17 AM 10/22/2017

## 2017-10-22 NOTE — Patient Instructions (Signed)
Office will call you with biopsy results 

## 2017-10-24 LAB — PATHOLOGY

## 2017-10-24 LAB — TISSUE SPECIMEN

## 2017-10-30 ENCOUNTER — Telehealth: Payer: Self-pay | Admitting: Neurology

## 2017-10-30 ENCOUNTER — Other Ambulatory Visit: Payer: Self-pay

## 2017-10-30 ENCOUNTER — Ambulatory Visit: Payer: Medicare Other | Admitting: Neurology

## 2017-10-30 ENCOUNTER — Encounter: Payer: Self-pay | Admitting: Neurology

## 2017-10-30 ENCOUNTER — Other Ambulatory Visit: Payer: Self-pay | Admitting: Neurology

## 2017-10-30 VITALS — BP 95/65 | HR 66 | Resp 18 | Ht 64.25 in | Wt 210.5 lb

## 2017-10-30 DIAGNOSIS — Z79899 Other long term (current) drug therapy: Secondary | ICD-10-CM | POA: Diagnosis not present

## 2017-10-30 DIAGNOSIS — R260 Ataxic gait: Secondary | ICD-10-CM

## 2017-10-30 DIAGNOSIS — G35 Multiple sclerosis: Secondary | ICD-10-CM

## 2017-10-30 DIAGNOSIS — E785 Hyperlipidemia, unspecified: Secondary | ICD-10-CM | POA: Diagnosis not present

## 2017-10-30 DIAGNOSIS — G35D Multiple sclerosis, unspecified: Secondary | ICD-10-CM

## 2017-10-30 NOTE — Telephone Encounter (Signed)
[employer] medicare order sent to GI. No auth they will reach out to the pt to schedule.  °

## 2017-10-30 NOTE — Progress Notes (Signed)
[facility]  PATIENT: Ann Oconnor DOB: 1975-09-09  REFERRING DOCTOR OR PCP:  Delia Chimes SOURCE: Patient, records, images  _________________________________   HISTORICAL  CHIEF COMPLAINT:  Chief Complaint  Patient presents with  . Multiple Sclerosis    Sts. she continues to tolerate Ocrevus well.  Last infusion 10/08/17/fim    HISTORY OF PRESENT ILLNESS:  Ann Oconnor is a 42 y.o. woman with a very aggressive MS who was diagnosed in 2002.   She had a very severe series of exacerbations in 2016 after stopping Gilen[place].     Update 10/30/2017: She is doing well.   She has been on Ocrevus for close to  Years and was on Novantrone before that for 4-5 cycles.     She denies any exacerbations.     She has no side effects form Ocrevus and has insomnia from the steroid x 1 night.     Labwork from earlier this year showed IgM was low at 14 (26-217) and IgG and CBC/diff were normal.   Her gait is doing well and she can climb stairs.     She denies numbness in her legs but has mild left sided weakness (old and much better than in 2016).   Bladder is doing well.  She denies much fatigue and is going to the gym daily.    She sleeps well most night.   Mood is doing well.    Cognition is fine.      Update 05/01/2017:  She had a small exacerbation in September 2018 with right leg weakness. She received a couple days of IV steroids and improved. She now feels that she is doing well. Her last (this infusion was just this month. She is tolerating it well. Before that she was on Novantrone and before that [place].  She is walking better now and could walk a mile without stopping.   She is not using her cane.    Balance is mildly wobbly at times but better than in September.       Vision is doing well.    Bladder is now doing well without any med's.    She is sleeping well at night and her fatigue is mild.      Mood is doing well and she is on Prozac.Marland Kitchen      She takes vitamin D  supplements for her low vitamin D.  Update 11/13/2016:   Ann Oconnor was last seen 2 weeks ago. She was stable at that time on ocrelizumab.   Over the weekend, she noted more difficulty with gait.   She notes her right leg is mildly weaker and she is throwing the foot put as she walks.  Balance is mildly worse.   She started using her cane again.    She denies any falls. She denies any fevers or infections.   Bladder function is fine.   Vision is unchanged.   She has fatigue and amantadine was tried after the last visit.   She noted no improvement and stopped.    Adderall was not tolerated in the past.    She sleeps well at night.  At last visit we checked vitamin D. It was low and she is taking high-dose supplements and we'll switch to OTC supplements in a few months.  Update 10/29/2016:  She is a ocrelizumab. Her next infusion is later this week. She has not had any exacerbations while on ocrelizumab and she tolerates it well. Before that she was on Novantrone  which helped to stabilize her MS but she needed to switch due to the lifetime limited.  She feels gait has improved further this year. Specifically she is walking well without a cane. She is able to do about 1 mile. She does need to look down at the ground or her feet when she walks for better balance and she did have 1 fall.    She continues to have numbness in both legs that has mildly improved over the last year. Bladder function is doing well and she has not had any incontinence since her last visit. She continues to experience fatigue that is physical more than cognitive. Adderall made her jittery so she stopped. She does not think she has ever been on amantadine.   __________________________________ From 04/19/2016: MS:    She switched from Novantrone to ocrelizumab in July 2017.     She tolerated the infusion well.  She had a total of 5 Novantrone treatments at 12 mg/M2.     She continues to have some deficits from her severe exacerbations early  2016.                                                                     Gait/strength:  She is able to walk much better and has walked a mie without a cane but that tires her out .   She regularly walks > 100 yards without rest.     She can climb a short flight of stairs.   She is driving now short distances.  She has mild weakness and spasticity in legs but not arms   Gait issues are due more to balance than strength.     Numbness:   She has bilateral fairly symmetric numbness in her arms and legs since the 2016 exaceration.    Numbness is not complete as it was but is still severe.   She had normal NCV.    Numbness is slightly tingling but not painful.   She is on gabapentin 400 mg twice a day with benefit   Bladder/bowel:   She reports no incontinence since last visit.     No significant frequency.    Vision:  She denies MS related visual issues.  Fatigue/sleep:  She has fatigue most days, worse if more active and worse in the afternoons.   Fatigue is more cognitive many days than physical.    She is able to sleep fairly well at night.  Mood/cognition:   Mood is much better than in 2016 and early 2017.  She has mild depression and anxiety, helped by Paxil.  Her cognitive issues have returned close to baseline.  She notes some verbal fluency issues.    MS:    She presented in 2002 with numbness in her legs and poor gait.  The MRI scan showed MS plaques in the spinal cord and the brain and she was diagnosed with multiple sclerosis. She was initially placed on Rebif. At first she did well with only a couple of small central exacerbations. However, 2003 she had a more severe exacerbation that affected her gait. She was placed on a combination Rebif and Copaxone for a short period of time and then was on Copaxone monotherapy. She switched to [place] in 2011  due to needle fatigue. She had numbness in her feet that would not go away and was switched to [place] from [place] in November 2015.  In January  2016, she lost strength in both legs and urinary incontinence. She had a course of IV steroids without any benefit in late January. Because there was no benefit, she was admitted to [facility] for a course of plasmapheresis starting February 5. She got no benefit from the plasmapheresis. During the next week, she was home but she was doing worse and worse. She then presented to [facility]. She was admitted for another week of steroids. An MRI done at admission showed very aggressive MS changes.    Brain 04/28/2014 shows multiple infratentorial plaques including left greater than right middle cerebellar peduncles, bilateral pons, and cerebellum. Additionally there are multiple foci in the hemispheres, some with a concentric demyelinating pattern that could be consistent with Balo's concentric sclerosis, an especially fulminant form of multiple sclerosis. She was unable to complete the examination or get contrast due to the need to terminate the study early.   When compared to an MRI dated 02/07/2012, there has been dramatic change with multiple new brain stem and hemispheric foci.  MRI of the cervical spine 10/26/2010. showed multiple plaques:  right at the C2 level, left aspect C2-3 level, right aspect C3 level, the posterior central aspect C3 level, upper C6 level slightly greater to the right and C7 level greater to the left.  Abnormal signal within the left aspect of the cord at the T3- T4 level.   MRI of the brain in 05/19/2014 showed several additional posterior fossa lesions prompting initiation of Novantrone.  Marland Kitchen     REVIEW OF SYSTEMS: Constitutional: No fevers, chills, sweats, or change in appetite.  Fatigue Eyes: No visual changes, double vision, eye pain Ear, nose and throat: No hearing loss, ear pain, nasal congestion, sore throat Cardiovascular: No chest pain, palpitations Respiratory: No shortness of breath at rest or with exertion.   No wheezes GastrointestinaI: No nausea, vomiting.  Some  fecal incontinence Genitourinary: as above. Musculoskeletal: No neck pain, back pain Integumentary: No rash, pruritus, skin lesions Neurological: as above Psychiatric: Notes depression at this time.  Some anxiety Endocrine: No palpitations, diaphoresis, change in appetite, change in weigh or increased thirst Hematologic/Lymphatic: No anemia, purpura, petechiae. Allergic/Immunologic: No itchy/runny eyes, nasal congestion, recent allergic reactions, rashes  ALLERGIES: No Known Allergies  HOME MEDICATIONS:  Current Outpatient Medications:  .  acetaminophen (TYLENOL) 500 MG tablet, Take 1,500 mg by mouth daily as needed for moderate pain or headache., Disp: , Rfl:  .  amantadine (SYMMETREL) 100 MG capsule, Take 1 capsule (100 mg total) by mouth 2 (two) times daily., Disp: 60 capsule, Rfl: 5 .  Cholecalciferol (VITAMIN D PO), Take by mouth., Disp: , Rfl:  .  gabapentin (NEURONTIN) 400 MG capsule, TAKE 1 CAPSULE(400 MG) BY MOUTH TWICE DAILY, Disp: 60 capsule, Rfl: 0 .  ocrelizumab 600 mg in sodium chloride 0.9 % 500 mL, Inject 600 mg into the vein every 6 (six) months., Disp: , Rfl:  .  PARoxetine (PAXIL-CR) 37.5 MG 24 hr tablet, TAKE 1 TABLET(37.5 MG) BY MOUTH DAILY, Disp: 30 tablet, Rfl: 9 .  rosuvastatin (CRESTOR) 10 MG tablet, Take 10 mg by mouth every evening. , Disp: , Rfl: 5 No current facility-administered medications for this visit.   Facility-Administered Medications Ordered in Other Visits:  .  gadopentetate dimeglumine (MAGNEVIST) injection 20 mL, 20 mL, Intravenous, Once PRN, Felecia Shelling, Emri Sample  A, MD .  gadopentetate dimeglumine (MAGNEVIST) injection 20 mL, 20 mL, Intravenous, Once PRN, Viviane Semidey, Nanine Means, MD  PAST MEDICAL HISTORY: Past Medical History:  Diagnosis Date  . Anxiety   . Family history of adverse reaction to anesthesia    Father - difficulty awaken- has sleep apena  . Gait instability   . Head injury, closed, with brief LOC ([place]) 2003   fall  . Incontinence   .  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  Colpo normal with neg ECC  . Movement disorder   . MS (multiple sclerosis) ([place]) 2002  . Seizures ([place]) 2003   none since 2003  . UTI (lower urinary tract infection)   . Vision abnormalities     PAST SURGICAL HISTORY: Past Surgical History:  Procedure Laterality Date  . CERVICAL CONE BIOPSY  2008   CIN 2  . COLONOSCOPY    . PORT-A-CATH REMOVAL N/A 02/16/2016   Procedure: REMOVAL PORT-A-CATH;  Surgeon: Donnie Mesa, MD;  Location: [facility];  Service: General;  Laterality: N/A;  . PORTACATH PLACEMENT Left 08/18/14  . PORTACATH PLACEMENT Left 08/18/2014   Procedure: LEFT SUBCLAVIAN VEIN PORT PLACEMENT;  Surgeon: Donnie Mesa, MD;  Location: [facility] OR;  Service: General;  Laterality: Left;    FAMILY HISTORY: Family History  Problem Relation Age of Onset  . Thyroid disease Mother   . Heart disease Father   . Mitral valve prolapse Father   . Cancer Father        lung/brain- tumors  . Thyroid disease Sister   . Cancer Paternal Aunt        not sure what kind, maybe cervix  . Thyroid disease Sister     SOCIAL HISTORY:  Social History   Socioeconomic History  . Marital status: Single    Spouse name: Not on file  . Number of children: Not on file  . Years of education: Not on file  . Highest education level: Not on file  Occupational History  . Not on file  Social Needs  . Financial resource strain: Not on file  . Food insecurity:    Worry: Not on file    Inability: Not on file  . Transportation needs:    Medical: Not on file    Non-medical: Not on file  Tobacco Use  . Smoking status: Never Smoker  . Smokeless tobacco: Never Used  Substance and Sexual Activity  . Alcohol use: No  . Drug use: No  . Sexual activity: Not Currently    Birth control/protection: None    Comment: 1st intercourse- 23, partners- 3,   Lifestyle  . Physical activity:    Days per week: Not on  file    Minutes per session: Not on file  . Stress: Not on file  Relationships  . Social connections:    Talks on phone: Not on file    Gets together: Not on file    Attends religious service: Not on file    Active member of club or organization: Not on file    Attends meetings of clubs or organizations: Not on file    Relationship status: Not on file  . Intimate partner violence:    Fear of current or ex partner: Not on file    Emotionally abused: Not on file    Physically abused: Not on file    Forced sexual activity: Not on file  Other Topics Concern  . Not on file  Social History Narrative  . Not on file     PHYSICAL EXAM  Vitals:   10/30/17 1052  BP: 95/65  Pulse: 66  Resp: 18  Weight: 210 lb 8 oz (95.5 kg)  Height: 5' 4.25" (1.632 m)    Body mass index is 35.85 kg/m.   General: The patient is well-developed and well-nourished and in no acute distress   Neurologic Exam  Mental status: The patient is alert and oriented x 3 at the time of the examination. The patient has apparent normal recent and remote memory,  attention span and concentration ability now seem normal.   Speech is normal.     Cranial nerves: Extraocular movements are full. Facial strength and sensation is normal. No dysarthria is noted.  No obvious hearing deficits are noted.  Motor:  Muscle bulk is normal.   Tone is mildly increased in legs, left greater than right.. Strength is  5/5 in arms and legs now.  Sensory: Sensory testing is intact to pinprick, soft touch and vibration sensation in arms.   Currently touch and vibration sensation is symmetric in the legs.   Coordination: Cerebellar testing reveals good finger-nose-finger.   Right heel-to-shin is normal but left heel-to-shin is reduced.   Gait and station: She has a normal station.  The gait is minimally wide.  The tandem gait is mildly wide.  Romberg is negative.  Reflexes: Deep tendon reflexes are increased bilaterally, slightly  more on the right.        DIAGNOSTIC DATA (LABS, IMAGING, TESTING) - I reviewed patient records, labs, notes, testing and imaging myself where available.  Lab Results  Component Value Date   WBC 4.2 05/01/2017   HGB 11.8 05/01/2017   HCT 35.6 05/01/2017   MCV 92 05/01/2017   PLT 309 05/01/2017      Component Value Date/Time   NA 143 10/30/2016 0948   K 5.1 10/30/2016 0948   CL 104 10/30/2016 0948   CO2 22 10/30/2016 0948   GLUCOSE 96 10/30/2016 0948   GLUCOSE 86 02/16/2016 0851   BUN 6 10/30/2016 0948   CREATININE 0.67 10/30/2016 0948   CREATININE 0.75 09/02/2012 1057   CALCIUM 9.1 10/30/2016 0948   PROT 6.9 10/30/2016 0948   ALBUMIN 4.5 10/30/2016 0948   AST 20 10/30/2016 0948   ALT 8 10/30/2016 0948   ALKPHOS 82 10/30/2016 0948   BILITOT 0.3 10/30/2016 0948   GFRNONAA 109 10/30/2016 0948   GFRAA 125 10/30/2016 0948   Lab Results  Component Value Date   CHOL 143 10/30/2016   HDL 59 10/30/2016   LDLCALC 68 10/30/2016   TRIG 78 10/30/2016   CHOLHDL 2.4 10/30/2016   No results found for: HGBA1C Lab Results  Component Value Date   VITAMINB12 348 04/04/2015   Lab Results  Component Value Date   TSH 2.871 08/21/2011       ASSESSMENT AND PLAN  Multiple sclerosis ([place]) - Plan: MR BRAIN W WO CONTRAST, CBC with Differential/Platelet, IgG, IgA, IgM  High risk medication use - Plan: Lipid Panel, CBC with Differential/Platelet, IgG, IgA, IgM  Ataxic gait  Hyperlipidemia, unspecified hyperlipidemia type - Plan: Lipid Panel   1.  She will continue ocrelizumab.  The IgM was low at last check earlier this year and we will recheck the IgG and IgM as well as the CBC with differential.  We will also check an MRI of the brain to determine if there is any subclinical progression.  If present, consider a different  disease modifying therapy. 2.   Continue vitamin D supplements 3.   Stay active and try to lose weight if possible.   4.   Return in 5-6 months but call  sooner if there are new or worsening neurologic symptoms.   Sybilla Malhotra A. Felecia Shelling, MD, PhD, FAAN Certified in Neurology, Clinical Neurophysiology, Sleep Medicine, Pain Medicine and Neuroimaging Director, [place] at [facility] [address], [place], [place] [address] [phone]

## 2017-10-31 ENCOUNTER — Telehealth: Payer: Self-pay | Admitting: *Deleted

## 2017-10-31 LAB — CBC WITH DIFFERENTIAL/PLATELET
Basophils Absolute: 0.1 10*3/uL (ref 0.0–0.2)
Basos: 1 %
EOS (ABSOLUTE): 0.1 10*3/uL (ref 0.0–0.4)
EOS: 1 %
HEMATOCRIT: 37.6 % (ref 34.0–46.6)
HEMOGLOBIN: 12.3 g/dL (ref 11.1–15.9)
IMMATURE GRANS (ABS): 0 10*3/uL (ref 0.0–0.1)
IMMATURE GRANULOCYTES: 0 %
LYMPHS ABS: 1.2 10*3/uL (ref 0.7–3.1)
Lymphs: 25 %
MCH: 30.9 pg (ref 26.6–33.0)
MCHC: 32.7 g/dL (ref 31.5–35.7)
MCV: 95 fL (ref 79–97)
MONOCYTES: 9 %
Monocytes Absolute: 0.4 10*3/uL (ref 0.1–0.9)
Neutrophils Absolute: 3.2 10*3/uL (ref 1.4–7.0)
Neutrophils: 64 %
Platelets: 303 10*3/uL (ref 150–450)
RBC: 3.98 x10E6/uL (ref 3.77–5.28)
RDW: 13 % (ref 12.3–15.4)
WBC: 5 10*3/uL (ref 3.4–10.8)

## 2017-10-31 LAB — LIPID PANEL
Chol/HDL Ratio: 2.3 ratio (ref 0.0–4.4)
Cholesterol, Total: 143 mg/dL (ref 100–199)
HDL: 62 mg/dL (ref >39)
LDL Calculated: 68 mg/dL (ref 0–99)
Triglycerides: 64 mg/dL (ref 0–149)
VLDL Cholesterol Cal: 13 mg/dL (ref 5–40)

## 2017-10-31 LAB — IGG, IGA, IGM
IGA/IMMUNOGLOBULIN A, SERUM: 59 mg/dL — AB (ref 87–352)
IGM (IMMUNOGLOBULIN M), SRM: 14 mg/dL — AB (ref 26–217)
IgG (Immunoglobin G), Serum: 772 mg/dL (ref 700–1600)

## 2017-10-31 NOTE — Telephone Encounter (Signed)
-----   Message from Britt Bottom, MD sent at 10/31/2017  1:14 PM EDT ----- Please let the patient know that the lab work is ok --- the IgM is still ow but unchanged from last time

## 2017-10-31 NOTE — Telephone Encounter (Signed)
Spoke with Florentina Jenny and reviewed below lab results . She verbalized understanding of same/fim

## 2017-11-06 ENCOUNTER — Ambulatory Visit
Admission: RE | Admit: 2017-11-06 | Discharge: 2017-11-06 | Disposition: A | Discharge status: Home / Self Care | Payer: Medicare Other | Source: Ambulatory Visit | Attending: Neurology | Admitting: Neurology

## 2017-11-06 DIAGNOSIS — G35 Multiple sclerosis: Secondary | ICD-10-CM

## 2017-11-06 MED ORDER — GADOBENATE DIMEGLUMINE 529 MG/ML IV SOLN
20.0000 mL | Freq: Once | INTRAVENOUS | Status: AC | PRN
  Administered 2017-11-06: 20 mL via INTRAVENOUS

## 2017-11-08 ENCOUNTER — Telehealth: Payer: Self-pay | Admitting: *Deleted

## 2017-11-08 NOTE — Telephone Encounter (Signed)
-----   Message from Britt Bottom, MD sent at 11/08/2017 10:58 AM EDT ----- Please let her know that the MRI did not show any new lesions.

## 2017-11-08 NOTE — Telephone Encounter (Signed)
Called and spoke with patient about MRI results. Advised it showed no new lesions. She verbalized understanding.

## 2017-11-29 ENCOUNTER — Other Ambulatory Visit: Payer: Self-pay | Admitting: Neurology

## 2017-12-29 ENCOUNTER — Other Ambulatory Visit: Payer: Self-pay | Admitting: Neurology

## 2018-02-27 ENCOUNTER — Other Ambulatory Visit: Payer: Self-pay | Admitting: Neurology

## 2018-03-31 ENCOUNTER — Telehealth: Payer: Self-pay | Admitting: *Deleted

## 2018-03-31 MED ORDER — DESVENLAFAXINE SUCCINATE ER 100 MG PO TB24: 100.0000 mg | ORAL_TABLET | Freq: Every day | ORAL | 5 refills | Status: DC

## 2018-03-31 NOTE — Telephone Encounter (Signed)
Pt in office today for her Ocrevus infusion. She is requesting an adjustment be made to her depression medication. She is currently taking paxil 37.5mg  qd po. Her father has cancer and she is under more stress lately. I verified pharmacy on file. I told her I would call her to let her know recommendations once I could speak with Dr. Felecia Shelling. She verbalized understanding.

## 2018-03-31 NOTE — Telephone Encounter (Addendum)
Spoke with Dr. Felecia Shelling- can have her switch to taking Pristiq 100mg  po qd

## 2018-03-31 NOTE — Telephone Encounter (Signed)
I called and spoke with pt. Relayed Dr. Garth Bigness recommendation. She is agreeable to try this. She knows she can stop paxil and start pristiq. No need to taper of paxil. Example: if she took paxil today she can stop and start taking pristiq tomorrow. She verbalized understanding. I e-scribed rx to the pharmacy for her.

## 2018-05-05 ENCOUNTER — Ambulatory Visit: Payer: Medicare Other | Admitting: Neurology

## 2018-05-05 ENCOUNTER — Encounter: Payer: Self-pay | Admitting: Neurology

## 2018-05-05 VITALS — BP 95/60 | HR 65 | Ht 64.25 in | Wt 193.5 lb

## 2018-05-05 DIAGNOSIS — G35 Multiple sclerosis: Secondary | ICD-10-CM | POA: Diagnosis not present

## 2018-05-05 DIAGNOSIS — E559 Vitamin D deficiency, unspecified: Secondary | ICD-10-CM | POA: Diagnosis not present

## 2018-05-05 DIAGNOSIS — R260 Ataxic gait: Secondary | ICD-10-CM

## 2018-05-05 DIAGNOSIS — Z79899 Other long term (current) drug therapy: Secondary | ICD-10-CM

## 2018-05-05 NOTE — Progress Notes (Signed)
[facility]  PATIENT: Ann Oconnor DOB: [birth year]  REFERRING DOCTOR OR PCP:  Delia Chimes SOURCE: Patient, records, images  _________________________________   HISTORICAL  CHIEF COMPLAINT:  Chief Complaint  Patient presents with  . Follow-up    RM 12 with mother. Last seen 10/30/17. Doing well, no new sx.  . Multiple Sclerosis    On Ocrevus, tolerating well. Last infusion: 03/31/18. Next one scheduled for: 09/29/18.   Marland Kitchen Depression    Taking Pristiq 100mg  po qd. She started this end of January and stopped the paxil. She has noticed improvement on pristiq. Tolerating well.    HISTORY OF PRESENT ILLNESS:  Ann Oconnor is a 43 y.o. woman with a very aggressive MS who was diagnosed in 2002.   She had a very severe series of exacerbations in 2016 after stopping Gilenya.     Update 05/05/2018:  She has been on Ocrevus since 2017 and is tolerating it well.   The last infusion was 03/31/2018.  IgM has been low at 14 (26-217 normal) and IgA is mildly low at 59 (87 up normal) but these are stable.   No exacerbations or new symptoms.   She did not do the flu shot this year and we discussed getting it.    Gait is ok and she does not use a cane.   When tired, she has some gait issues due to left > right leg weakness.   She exercises almost every day.  Her left leg is mildly spastic.       She has mild leg numbness.    Bladder is doing well.   Vision is fine.   She has no fatigue most days.    She sleeps well most nights.   Mood and cognition are doing well.    Update 10/30/2017: She is doing well.   She has been on Ocrevus for close to  Years and was on Novantrone before that for 4-5 cycles.     She denies any exacerbations.     She has no side effects form Ocrevus and has insomnia from the steroid x 1 night.     Labwork from earlier this year showed IgM was low at 14 (26-217) and IgG and CBC/diff were normal.   Her gait is doing well and she can climb stairs.     She denies  numbness in her legs but has mild left sided weakness (old and much better than in 2016).   Bladder is doing well.  She denies much fatigue and is going to the gym daily.    She sleeps well most night.   Mood is doing well.    Cognition is fine.      Update 05/01/2017:  She had a small exacerbation in September 2018 with right leg weakness. She received a couple days of IV steroids and improved. She now feels that she is doing well. Her last (this infusion was just this month. She is tolerating it well. Before that she was on Novantrone and before that [place].  She is walking better now and could walk a mile without stopping.   She is not using her cane.    Balance is mildly wobbly at times but better than in September.       Vision is doing well.    Bladder is now doing well without any med's.    She is sleeping well at night and her fatigue is mild.      Mood is doing  well and she is on Prozac.Marland Kitchen      She takes vitamin D supplements for her low vitamin D.  Update 11/13/2016:   Ann Oconnor was last seen 2 weeks ago. She was stable at that time on ocrelizumab.   Over the weekend, she noted more difficulty with gait.   She notes her right leg is mildly weaker and she is throwing the foot put as she walks.  Balance is mildly worse.   She started using her cane again.    She denies any falls. She denies any fevers or infections.   Bladder function is fine.   Vision is unchanged.   She has fatigue and amantadine was tried after the last visit.   She noted no improvement and stopped.    Adderall was not tolerated in the past.    She sleeps well at night.  At last visit we checked vitamin D. It was low and she is taking high-dose supplements and we'll switch to OTC supplements in a few months.  Update 10/29/2016:  She is a ocrelizumab. Her next infusion is later this week. She has not had any exacerbations while on ocrelizumab and she tolerates it well. Before that she was on Novantrone which helped to stabilize her  MS but she needed to switch due to the lifetime limited.  She feels gait has improved further this year. Specifically she is walking well without a cane. She is able to do about 1 mile. She does need to look down at the ground or her feet when she walks for better balance and she did have 1 fall.    She continues to have numbness in both legs that has mildly improved over the last year. Bladder function is doing well and she has not had any incontinence since her last visit. She continues to experience fatigue that is physical more than cognitive. Adderall made her jittery so she stopped. She does not think she has ever been on amantadine.   __________________________________ From 04/19/2016: MS:    She switched from Novantrone to ocrelizumab in July 2017.     She tolerated the infusion well.  She had a total of 5 Novantrone treatments at 12 mg/M2.     She continues to have some deficits from her severe exacerbations early 2016.                                                                     Gait/strength:  She is able to walk much better and has walked a mie without a cane but that tires her out .   She regularly walks > 100 yards without rest.     She can climb a short flight of stairs.   She is driving now short distances.  She has mild weakness and spasticity in legs but not arms   Gait issues are due more to balance than strength.     Numbness:   She has bilateral fairly symmetric numbness in her arms and legs since the 2016 exaceration.    Numbness is not complete as it was but is still severe.   She had normal NCV.    Numbness is slightly tingling but not painful.   She is on gabapentin 400 mg twice a  day with benefit   Bladder/bowel:   She reports no incontinence since last visit.     No significant frequency.    Vision:  She denies MS related visual issues.  Fatigue/sleep:  She has fatigue most days, worse if more active and worse in the afternoons.   Fatigue is more cognitive many days  than physical.    She is able to sleep fairly well at night.  Mood/cognition:   Mood is much better than in 2016 and early 2017.  She has mild depression and anxiety, helped by Paxil.  Her cognitive issues have returned close to baseline.  She notes some verbal fluency issues.    MS:    She presented in 2002 with numbness in her legs and poor gait.  The MRI scan showed MS plaques in the spinal cord and the brain and she was diagnosed with multiple sclerosis. She was initially placed on Rebif. At first she did well with only a couple of small central exacerbations. However, 2003 she had a more severe exacerbation that affected her gait. She was placed on a combination Rebif and Copaxone for a short period of time and then was on Copaxone monotherapy. She switched to Granjeno in 2011 due to needle fatigue. She had numbness in her feet that would not go away and was switched to Tecfidera from [place] in November 2015.  In January 2016, she lost strength in both legs and urinary incontinence. She had a course of IV steroids without any benefit in late January. Because there was no benefit, she was admitted to [facility] for a course of plasmapheresis starting February 5. She got no benefit from the plasmapheresis. During the next week, she was home but she was doing worse and worse. She then presented to [facility]. She was admitted for another week of steroids. An MRI done at admission showed very aggressive MS changes.    Brain 04/28/2014 shows multiple infratentorial plaques including left greater than right middle cerebellar peduncles, bilateral pons, and cerebellum. Additionally there are multiple foci in the hemispheres, some with a concentric demyelinating pattern that could be consistent with Balo's concentric sclerosis, an especially fulminant form of multiple sclerosis. She was unable to complete the examination or get contrast due to the need to terminate the study early.   When compared to an MRI dated  02/07/2012, there has been dramatic change with multiple new brain stem and hemispheric foci.  MRI of the cervical spine 10/26/2010. showed multiple plaques:  right at the C2 level, left aspect C2-3 level, right aspect C3 level, the posterior central aspect C3 level, upper C6 level slightly greater to the right and C7 level greater to the left.  Abnormal signal within the left aspect of the cord at the T3- T4 level.   MRI of the brain in 05/19/2014 showed several additional posterior fossa lesions prompting initiation of Novantrone.  Marland Kitchen     REVIEW OF SYSTEMS: Constitutional: No fevers, chills, sweats, or change in appetite.  Fatigue Eyes: No visual changes, double vision, eye pain Ear, nose and throat: No hearing loss, ear pain, nasal congestion, sore throat Cardiovascular: No chest pain, palpitations Respiratory: No shortness of breath at rest or with exertion.   No wheezes GastrointestinaI: No nausea, vomiting.  Some fecal incontinence Genitourinary: as above. Musculoskeletal: No neck pain, back pain Integumentary: No rash, pruritus, skin lesions Neurological: as above Psychiatric: Notes depression at this time.  Some anxiety Endocrine: No palpitations, diaphoresis, change in appetite, change in  weigh or increased thirst Hematologic/Lymphatic: No anemia, purpura, petechiae. Allergic/Immunologic: No itchy/runny eyes, nasal congestion, recent allergic reactions, rashes  ALLERGIES: No Known Allergies  HOME MEDICATIONS:  Current Outpatient Medications:  .  acetaminophen (TYLENOL) 500 MG tablet, Take 1,500 mg by mouth daily as needed for moderate pain or headache., Disp: , Rfl:  .  Cholecalciferol (VITAMIN D PO), Take by mouth., Disp: , Rfl:  .  desvenlafaxine (PRISTIQ) 100 MG 24 hr tablet, Take 1 tablet (100 mg total) by mouth daily., Disp: 30 tablet, Rfl: 5 .  gabapentin (NEURONTIN) 400 MG capsule, TAKE 1 CAPSULE(400 MG) BY MOUTH TWICE DAILY, Disp: 180 capsule, Rfl: 3 .  ocrelizumab 600  mg in sodium chloride 0.9 % 500 mL, Inject 600 mg into the vein every 6 (six) months., Disp: , Rfl:  No current facility-administered medications for this visit.   Facility-Administered Medications Ordered in Other Visits:  .  gadopentetate dimeglumine (MAGNEVIST) injection 20 mL, 20 mL, Intravenous, Once PRN, Ann Oconnor A, MD .  gadopentetate dimeglumine (MAGNEVIST) injection 20 mL, 20 mL, Intravenous, Once PRN, Tyshan Enderle, Nanine Means, MD  PAST MEDICAL HISTORY: Past Medical History:  Diagnosis Date  . Anxiety   . Family history of adverse reaction to anesthesia    Father - difficulty awaken- has sleep apena  . Gait instability   . Head injury, closed, with brief LOC ([place]) 2003   fall  . Incontinence   . 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  Colpo normal with neg ECC  . Movement disorder   . MS (multiple sclerosis) ([place]) 2002  . Seizures ([place]) 2003   none since 2003  . UTI (lower urinary tract infection)   . Vision abnormalities     PAST SURGICAL HISTORY: Past Surgical History:  Procedure Laterality Date  . CERVICAL CONE BIOPSY  2008   CIN 2  . COLONOSCOPY    . PORT-A-CATH REMOVAL N/A 02/16/2016   Procedure: REMOVAL PORT-A-CATH;  Surgeon: Donnie Mesa, MD;  Location: [place];  Service: General;  Laterality: N/A;  . PORTACATH PLACEMENT Left 08/18/14  . PORTACATH PLACEMENT Left 08/18/2014   Procedure: LEFT SUBCLAVIAN VEIN PORT PLACEMENT;  Surgeon: Donnie Mesa, MD;  Location: [facility];  Service: General;  Laterality: Left;    FAMILY HISTORY: Family History  Problem Relation Age of Onset  . Thyroid disease Mother   . Heart disease Father   . Mitral valve prolapse Father   . Cancer Father        lung/brain- tumors  . Thyroid disease Sister   . Cancer Paternal Aunt        not sure what kind, maybe cervix  . Thyroid disease Sister     SOCIAL HISTORY:  Social History   Socioeconomic History  . Marital status:  Single    Spouse name: Not on file  . Number of children: Not on file  . Years of education: Not on file  . Highest education level: Not on file  Occupational History  . Not on file  Social Needs  . Financial resource strain: Not on file  . Food insecurity:    Worry: Not on file    Inability: Not on file  . Transportation needs:    Medical: Not on file    Non-medical: Not on file  Tobacco Use  . Smoking status: Never Smoker  . Smokeless tobacco: Never Used  Substance and Sexual Activity  . Alcohol use: No  . Drug  use: No  . Sexual activity: Not Currently    Birth control/protection: None    Comment: 1st intercourse- 23, partners- 3,   Lifestyle  . Physical activity:    Days per week: Not on file    Minutes per session: Not on file  . Stress: Not on file  Relationships  . Social connections:    Talks on phone: Not on file    Gets together: Not on file    Attends religious service: Not on file    Active member of club or organization: Not on file    Attends meetings of clubs or organizations: Not on file    Relationship status: Not on file  . Intimate partner violence:    Fear of current or ex partner: Not on file    Emotionally abused: Not on file    Physically abused: Not on file    Forced sexual activity: Not on file  Other Topics Concern  . Not on file  Social History Narrative  . Not on file     PHYSICAL EXAM  Vitals:   05/05/18 1102  BP: 95/60  Pulse: 65  SpO2: 99%  Weight: 193 lb 8 oz (87.8 kg)  Height: 5' 4.25" (1.632 m)    Body mass index is 32.96 kg/m.   General: The patient is well-developed and well-nourished and in no acute distress   Neurologic Exam  Mental status: The patient is alert and oriented x 3 at the time of the examination. The patient has apparent normal recent and remote memory,  attention span and concentration ability now seem normal.   Speech is normal.     Cranial nerves: Extraocular movements are full. Facial strength  and sensation is normal. No dysarthria is noted.  No obvious hearing deficits are noted.  Motor:  Muscle bulk is normal.   Tone is mildly increased in legs, left greater than right.. Strength is  5/5 in arms and legs now except 4+/5 left EHL.   Sensory: Sensory testing is intact to pinprick, soft touch and vibration sensation in arms and legs now.   Coordination: Cerebellar testing reveals good finger-nose-finger.   Heels to shin is reduced on the left   Gait and station: She has a normal station.  The gait is slightly wide.  The tandem gait is wide.  Romberg is negative.  Reflexes: Deep tendon reflexes are increased bilaterally in legs.        DIAGNOSTIC DATA (LABS, IMAGING, TESTING) - I reviewed patient records, labs, notes, testing and imaging myself where available.  Lab Results  Component Value Date   WBC 5.0 10/30/2017   HGB 12.3 10/30/2017   HCT 37.6 10/30/2017   MCV 95 10/30/2017   PLT 303 10/30/2017      Component Value Date/Time   NA 143 10/30/2016 0948   K 5.1 10/30/2016 0948   CL 104 10/30/2016 0948   CO2 22 10/30/2016 0948   GLUCOSE 96 10/30/2016 0948   GLUCOSE 86 02/16/2016 0851   BUN 6 10/30/2016 0948   CREATININE 0.67 10/30/2016 0948   CREATININE 0.75 09/02/2012 1057   CALCIUM 9.1 10/30/2016 0948   PROT 6.9 10/30/2016 0948   ALBUMIN 4.5 10/30/2016 0948   AST 20 10/30/2016 0948   ALT 8 10/30/2016 0948   ALKPHOS 82 10/30/2016 0948   BILITOT 0.3 10/30/2016 0948   GFRNONAA 109 10/30/2016 0948   GFRAA 125 10/30/2016 0948   Lab Results  Component Value Date   CHOL 143 10/30/2017  HDL 62 10/30/2017   LDLCALC 68 10/30/2017   TRIG 64 10/30/2017   CHOLHDL 2.3 10/30/2017   No results found for: HGBA1C Lab Results  Component Value Date   VITAMINB12 348 04/04/2015   Lab Results  Component Value Date   TSH 2.871 08/21/2011       ASSESSMENT AND PLAN  Multiple sclerosis ([place]) - Plan: CBC with Differential/Platelet, IgG, IgA, IgM  Vitamin D  deficiency - Plan: VITAMIN D 25 Hydroxy (Vit-D Deficiency, Fractures)  High risk medication use - Plan: CBC with Differential/Platelet, IgG, IgA, IgM, VITAMIN D 25 Hydroxy (Vit-D Deficiency, Fractures)  Ataxic gait   1.  She will continue ocrelizumab.  The IgM and IgA were low at last check 6 months ago and we will recheck the Abs as well as the CBC with differential.  If they drop further, consider change in dose or interval.   2.   Continue vitamin D supplements 3.   Stay active and try to lose weight if possible.   4.   Return in 5-6 months but call sooner if there are new or worsening neurologic symptoms.   Locklan Canoy A. Felecia Shelling, MD, PhD, FAAN Certified in Neurology, Clinical Neurophysiology, Sleep Medicine, Pain Medicine and Neuroimaging Director, [place] at [facility] [address] [place], [place] [address] [phone]

## 2018-05-06 ENCOUNTER — Telehealth: Payer: Self-pay | Admitting: *Deleted

## 2018-05-06 LAB — CBC WITH DIFFERENTIAL/PLATELET
Basophils Absolute: 0.1 10*3/uL (ref 0.0–0.2)
Basos: 1 %
EOS (ABSOLUTE): 0.1 10*3/uL (ref 0.0–0.4)
Eos: 2 %
Hematocrit: 36.1 % (ref 34.0–46.6)
Hemoglobin: 12.2 g/dL (ref 11.1–15.9)
Immature Grans (Abs): 0 10*3/uL (ref 0.0–0.1)
Immature Granulocytes: 0 %
Lymphocytes Absolute: 1.3 10*3/uL (ref 0.7–3.1)
Lymphs: 28 %
MCH: 31.6 pg (ref 26.6–33.0)
MCHC: 33.8 g/dL (ref 31.5–35.7)
MCV: 94 fL (ref 79–97)
MONOCYTES: 10 %
Monocytes Absolute: 0.5 10*3/uL (ref 0.1–0.9)
Neutrophils Absolute: 2.6 10*3/uL (ref 1.4–7.0)
Neutrophils: 59 %
Platelets: 321 10*3/uL (ref 150–450)
RBC: 3.86 x10E6/uL (ref 3.77–5.28)
RDW: 12.4 % (ref 11.7–15.4)
WBC: 4.4 10*3/uL (ref 3.4–10.8)

## 2018-05-06 LAB — IGG, IGA, IGM
IGG (IMMUNOGLOBIN G), SERUM: 805 mg/dL (ref 700–1600)
IgA/Immunoglobulin A, Serum: 57 mg/dL — ABNORMAL LOW (ref 87–352)
IgM (Immunoglobulin M), Srm: 16 mg/dL — ABNORMAL LOW (ref 26–217)

## 2018-05-06 LAB — VITAMIN D 25 HYDROXY (VIT D DEFICIENCY, FRACTURES): Vit D, 25-Hydroxy: 42.3 ng/mL (ref 30.0–100.0)

## 2018-05-06 NOTE — Telephone Encounter (Signed)
I called and spoke with Ann Oconnor about results per Dr. Felecia Shelling note. She verbalized understanding. She is already taking 5000U VIT D po qd. She will continue this.

## 2018-05-06 NOTE — Telephone Encounter (Signed)
-----   Message from Britt Bottom, MD sent at 05/06/2018  9:50 AM EST ----- The antibodies are about the same as the last visit so we will continue the Ocrevus at the current dose.  Vitamin D is now in the normal range.  She should take 5000 units daily over-the-counter.

## 2018-08-13 DIAGNOSIS — R6884 Jaw pain: Secondary | ICD-10-CM | POA: Diagnosis not present

## 2018-08-13 DIAGNOSIS — N390 Urinary tract infection, site not specified: Secondary | ICD-10-CM | POA: Diagnosis not present

## 2018-08-13 DIAGNOSIS — R6883 Chills (without fever): Secondary | ICD-10-CM | POA: Diagnosis not present

## 2018-08-13 DIAGNOSIS — R569 Unspecified convulsions: Secondary | ICD-10-CM | POA: Diagnosis present

## 2018-08-14 ENCOUNTER — Encounter (HOSPITAL_COMMUNITY): Payer: Self-pay | Admitting: Emergency Medicine

## 2018-08-14 ENCOUNTER — Emergency Department (HOSPITAL_COMMUNITY)
Admission: EM | Admit: 2018-08-14 | Discharge: 2018-08-14 | Disposition: A | Discharge status: Home / Self Care | Payer: Medicare Other | Attending: Emergency Medicine | Admitting: Emergency Medicine

## 2018-08-14 ENCOUNTER — Other Ambulatory Visit: Payer: Self-pay

## 2018-08-14 DIAGNOSIS — R6883 Chills (without fever): Secondary | ICD-10-CM

## 2018-08-14 DIAGNOSIS — N390 Urinary tract infection, site not specified: Secondary | ICD-10-CM

## 2018-08-14 LAB — URINALYSIS, COMPLETE (UACMP) WITH MICROSCOPIC
Bilirubin Urine: NEGATIVE
Glucose, UA: NEGATIVE mg/dL
Hgb urine dipstick: NEGATIVE
Ketones, ur: NEGATIVE mg/dL
Nitrite: POSITIVE — AB
Protein, ur: NEGATIVE mg/dL
Specific Gravity, Urine: 1.01 (ref 1.005–1.030)
WBC, UA: >50 WBC/hpf — ABNORMAL HIGH (ref 0–5)
pH: 6 (ref 5.0–8.0)

## 2018-08-14 MED ORDER — CEPHALEXIN 500 MG PO CAPS: 500.0000 mg | ORAL_CAPSULE | Freq: Two times a day (BID) | ORAL | 0 refills | Status: DC

## 2018-08-14 MED ORDER — SODIUM CHLORIDE 0.9 % IV SOLN
1.0000 g | Freq: Once | INTRAVENOUS | Status: AC
  Administered 2018-08-14: 1 g via INTRAVENOUS
  Filled 2018-08-14: qty 10

## 2018-08-14 NOTE — ED Triage Notes (Addendum)
Arrived by EMS from home. Patient's mother reports seizure-like activity; also reports right jaw pain. EMS reports patient was not postictal. A&O X4, NAD

## 2018-08-14 NOTE — ED Notes (Signed)
Bed: WA08 Expected date:  Expected time:  Means of arrival:  Comments: 

## 2018-08-14 NOTE — ED Provider Notes (Signed)
For H&P see Epic downtime paper documentation.  Nursing notes and vitals signs, including pulse oximetry, reviewed.  Summary of this visit's results, reviewed by myself:  EKG:  EKG Interpretation  Date/Time:    Ventricular Rate:    PR Interval:    QRS Duration:   QT Interval:    QTC Calculation:   R Axis:     Text Interpretation:         Labs:  Results for orders placed or performed during the hospital encounter of 08/14/18 (from the past 24 hour(s))  Urinalysis, Complete w Microscopic     Status: Abnormal   Collection Time: 08/14/18  2:30 AM  Result Value Ref Range   Color, Urine YELLOW YELLOW   APPearance HAZY (A) CLEAR   Specific Gravity, Urine 1.010 1.005 - 1.030   pH 6.0 5.0 - 8.0   Glucose, UA NEGATIVE NEGATIVE mg/dL   Hgb urine dipstick NEGATIVE NEGATIVE   Bilirubin Urine NEGATIVE NEGATIVE   Ketones, ur NEGATIVE NEGATIVE mg/dL   Protein, ur NEGATIVE NEGATIVE mg/dL   Nitrite POSITIVE (A) NEGATIVE   Leukocytes,Ua LARGE (A) NEGATIVE   RBC / HPF 6-10 0 - 5 RBC/hpf   WBC, UA >50 (H) 0 - 5 WBC/hpf   Bacteria, UA RARE (A) NONE SEEN   Squamous Epithelial / LPF 0-5 0 - 5   Mucus PRESENT    I doubt her symptoms represented a seizure, it was more consistent with a shaking chill associated with the urinary tract infection shown on her urinalysis.  Urine has been sent for culture.  She does have a history of frequent urinary tract infections, not uncommon in multiple sclerosis patients.   Hanh Kertesz, Jenny Reichmann, MD 08/14/18 [phone]

## 2018-08-16 LAB — URINE CULTURE: Culture: >=100000 — AB

## 2018-08-17 ENCOUNTER — Telehealth: Payer: Self-pay | Admitting: Emergency Medicine

## 2018-08-17 NOTE — Telephone Encounter (Signed)
Post ED Visit - Positive Culture Follow-up  Culture report reviewed by antimicrobial stewardship pharmacist: [place] Team []  Elenor Quinones, Pharm.D. []  Heide Guile, Pharm.D., BCPS AQ-ID []  Parks Neptune, Pharm.D., BCPS []  Alycia Rossetti, Pharm.D., BCPS []  [place], Pharm.D., BCPS, AAHIVP []  Legrand Como, Pharm.D., BCPS, AAHIVP []  Salome Arnt, PharmD, BCPS []  Johnnette Gourd, PharmD, BCPS []  Hughes Better, PharmD, BCPS []  Leeroy Cha, PharmD []  Laqueta Linden, PharmD, BCPS []  Albertina Parr, PharmD  [place] Team []  Leodis Sias, PharmD []  Lindell Spar, PharmD []  Royetta Asal, PharmD []  Graylin Shiver, Rph []  Rema Fendt) Glennon Mac, PharmD []  Arlyn Dunning, PharmD []  Netta Cedars, PharmD [x]  Dia Sitter, PharmD []  Leone Haven, PharmD []  Gretta Arab, PharmD []  Theodis Shove, PharmD []  Peggyann Juba, PharmD []  Reuel Boom, PharmD   Positive urine culture Treated with cephalexin, organism sensitive to the same and no further patient follow-up is required at this time.  Larene Beach Lillian Tigges 08/17/2018, 10:51 AM

## 2018-09-10 ENCOUNTER — Other Ambulatory Visit: Payer: Self-pay | Admitting: Neurology

## 2018-09-19 ENCOUNTER — Other Ambulatory Visit: Payer: Self-pay

## 2018-09-22 ENCOUNTER — Other Ambulatory Visit: Payer: Self-pay

## 2018-09-22 ENCOUNTER — Encounter: Payer: Self-pay | Admitting: Women's Health

## 2018-09-22 ENCOUNTER — Ambulatory Visit (INDEPENDENT_AMBULATORY_CARE_PROVIDER_SITE_OTHER): Payer: Medicare Other | Admitting: Women's Health

## 2018-09-22 VITALS — BP 110/70 | Ht 64.0 in | Wt 185.0 lb

## 2018-09-22 DIAGNOSIS — Z01419 Encounter for gynecological examination (general) (routine) without abnormal findings: Secondary | ICD-10-CM | POA: Diagnosis not present

## 2018-09-22 NOTE — Addendum Note (Signed)
Addended by: Lorine Bears on: 09/22/2018 02:27 PM   Modules accepted: Orders

## 2018-09-22 NOTE — Patient Instructions (Signed)

## 2018-09-22 NOTE — Progress Notes (Signed)
[place] [birth year] [id number]    History:    Presents for breast and pelvic exam with no complaints.  2016 diagnosed with MS and was put on a chemotherapeutic medication, cycle stopped, elevated FSH and have never resumed.  Normal mammogram history.  2017 LGSIL with negative biopsy, 2019 LGSIL with negative high risk HPV biopsy LGSIL.  Is on disability for short-term memory loss and difficulty retrieving words from [place].  Primary care manages labs.  Has lost 30 pounds with diet and exercise in the past year.  Not sexually active in years, denies need for STD screen.  Walks independently at one time needed a walker.  Past medical history, past surgical history, family history and social history were all reviewed and documented in the EPIC chart.  Father deceased this year.  Lives close to her mother.  ROS:  A ROS was performed and pertinent positives and negatives are included.  Exam:  Vitals:   09/22/18 1157  BP: 110/70  Weight: 185 lb (83.9 kg)  Height: 5\' 4"  (1.626 m)   Body mass index is 31.76 kg/m.   General appearance:  Normal Thyroid:  Symmetrical, normal in size, without palpable masses or nodularity. Respiratory  Auscultation:  Clear without wheezing or rhonchi Cardiovascular  Auscultation:  Regular rate, without rubs, murmurs or gallops  Edema/varicosities:  Not grossly evident Abdominal  Soft,nontender, without masses, guarding or rebound.  Liver/spleen:  No organomegaly noted  Hernia:  None appreciated  Skin  Inspection:  Grossly normal   Breasts: Examined lying and sitting.     Right: Without masses, retractions, discharge or axillary adenopathy.     Left: Without masses, retractions, discharge or axillary adenopathy. Gentitourinary   Inguinal/mons:  Normal without inguinal adenopathy  External genitalia:  Normal  BUS/Urethra/Skene's glands:  Normal  Vagina: Atrophic cervix:  Normal  Uterus:   normal in size, shape and contour.  Midline and  mobile  Adnexa/parametria:     Rt: Without masses or tenderness.   Lt: Without masses or tenderness.  Anus and perineum: Normal  Digital rectal exam: Normal sphincter tone without palpated masses or tenderness  Assessment/Plan:  43 y.o. S WF G0 for breast and pelvic exam with no GYN complaints.  Postmenopausal/no HRT/no bleeding 2019 LGSIL negative high risk HPV biopsy LGSIL for annual exam.   Obesity MS-neurologist manages Anxiety/depression-primary care manages labs and meds 08/2018 pyelonephritis has follow-up with neurologist  Plan: Pap, will triage based on results.  SBEs, annual screening mammogram due next month, calcium rich foods, vitamin D 2000 IUs daily.  Congratulated on weight loss and healthy lifestyle encouraged to continue regular exercise.and cutting calories.    [place], 12:49 PM 09/22/2018

## 2018-09-22 NOTE — Addendum Note (Signed)
Addended by: Lorine Bears on: 09/22/2018 02:20 PM   Modules accepted: Orders

## 2018-09-24 LAB — URINE CULTURE
MICRO NUMBER:: 688154
Result:: NO GROWTH
SPECIMEN QUALITY:: ADEQUATE

## 2018-09-24 LAB — PAP, TP IMAGING W/ HPV RNA, RFLX HPV TYPE 16,18/45: HPV DNA High Risk: NOT DETECTED

## 2018-09-24 LAB — URINALYSIS, COMPLETE W/RFL CULTURE
Bacteria, UA: NONE SEEN /HPF
Bilirubin Urine: NEGATIVE
Glucose, UA: NEGATIVE
Hgb urine dipstick: NEGATIVE
Hyaline Cast: NONE SEEN /LPF
Ketones, ur: NEGATIVE
Nitrites, Initial: NEGATIVE
Protein, ur: NEGATIVE
RBC / HPF: NONE SEEN /HPF (ref 0–2)
Specific Gravity, Urine: 1.011 (ref 1.001–1.03)
pH: 8 (ref 5.0–8.0)

## 2018-09-24 LAB — CULTURE INDICATED

## 2018-11-05 ENCOUNTER — Ambulatory Visit: Payer: Medicare Other | Admitting: Neurology

## 2018-11-11 ENCOUNTER — Other Ambulatory Visit: Payer: Self-pay | Admitting: Women's Health

## 2018-11-11 DIAGNOSIS — Z1231 Encounter for screening mammogram for malignant neoplasm of breast: Secondary | ICD-10-CM

## 2018-11-14 ENCOUNTER — Ambulatory Visit
Admission: RE | Admit: 2018-11-14 | Discharge: 2018-11-14 | Disposition: A | Discharge status: Home / Self Care | Payer: Medicare Other | Source: Ambulatory Visit | Attending: Women's Health | Admitting: Women's Health

## 2018-11-14 ENCOUNTER — Other Ambulatory Visit: Payer: Self-pay

## 2018-11-14 DIAGNOSIS — Z1231 Encounter for screening mammogram for malignant neoplasm of breast: Secondary | ICD-10-CM

## 2018-11-25 ENCOUNTER — Encounter: Payer: Self-pay | Admitting: Neurology

## 2018-11-25 ENCOUNTER — Ambulatory Visit: Payer: Medicare Other | Admitting: Neurology

## 2018-11-25 ENCOUNTER — Other Ambulatory Visit: Payer: Self-pay

## 2018-11-25 ENCOUNTER — Encounter: Payer: Self-pay | Admitting: Gynecology

## 2018-11-25 VITALS — BP 107/75 | HR 74 | Temp 97.7°F | Ht 64.0 in | Wt 186.0 lb

## 2018-11-25 DIAGNOSIS — E559 Vitamin D deficiency, unspecified: Secondary | ICD-10-CM | POA: Diagnosis not present

## 2018-11-25 DIAGNOSIS — G35 Multiple sclerosis: Secondary | ICD-10-CM

## 2018-11-25 DIAGNOSIS — N390 Urinary tract infection, site not specified: Secondary | ICD-10-CM

## 2018-11-25 DIAGNOSIS — R260 Ataxic gait: Secondary | ICD-10-CM | POA: Diagnosis not present

## 2018-11-25 DIAGNOSIS — R200 Anesthesia of skin: Secondary | ICD-10-CM | POA: Diagnosis not present

## 2018-11-25 DIAGNOSIS — Z79899 Other long term (current) drug therapy: Secondary | ICD-10-CM

## 2018-11-25 NOTE — Progress Notes (Signed)
[facility]  PATIENT: Ann Oconnor DOB: 1975/10/04  REFERRING DOCTOR OR PCP:  Delia Chimes SOURCE: Patient, records, images  _________________________________   HISTORICAL  CHIEF COMPLAINT:  Chief Complaint  Patient presents with   Follow-up    RM 17 with mother (temp: 98.0). Last seen 05/05/2018.    Multiple Sclerosis    On Ocrevus. Last infusion 09/29/18    HISTORY OF PRESENT ILLNESS:  Ann Oconnor is a 43 y.o. woman with a very aggressive MS who was diagnosed in 2002.   She had a very severe series of exacerbations in 2016 after stopping Gilenya.     Update 11/25/2018: She has been on Ocrevus x 3 years, last infusion ws July 2020.    Her IgG and IgM have been low and we discussed stretching the Ocrevus out to every 8 months.     No URI but had bad UTI a few months ago.  She had chills with shaking but did not have a fever.   She has had a few bad UTIs but does not note issues with urinary hesitancy or problems emptying.     Gait is doing well.  She notes some left leg weakness but it is stable and much better than a few years ago.    She has mild left leg numbness.  Vision is fine.  She has some fatigue.   She is sleeping well.   Notes no changes in cognition and mood is doing well.     Update 05/05/2018:  She has been on Ocrevus since 2017 and is tolerating it well.   The last infusion was 03/31/2018.  IgM has been low at 14 (26-217 normal) and IgA is mildly low at 59 (87 up normal) but these are stable.   No exacerbations or new symptoms.   She did not do the flu shot this year and we discussed getting it.    Gait is ok and she does not use a cane.   When tired, she has some gait issues due to left > right leg weakness.   She exercises almost every day.  Her left leg is mildly spastic.       She has mild leg numbness.    Bladder is doing well.   Vision is fine.   She has no fatigue most days.    She sleeps well most nights.   Mood and cognition are doing  well.    Update 10/30/2017: She is doing well.   She has been on Ocrevus for close to  Years and was on Novantrone before that for 4-5 cycles.     She denies any exacerbations.     She has no side effects form Ocrevus and has insomnia from the steroid x 1 night.     Labwork from earlier this year showed IgM was low at 14 (26-217) and IgG and CBC/diff were normal.   Her gait is doing well and she can climb stairs.     She denies numbness in her legs but has mild left sided weakness (old and much better than in 2016).   Bladder is doing well.  She denies much fatigue and is going to the gym daily.    She sleeps well most night.   Mood is doing well.    Cognition is fine.      Update 05/01/2017:  She had a small exacerbation in September 2018 with right leg weakness. She received a couple days of IV steroids and  improved. She now feels that she is doing well. Her last (this infusion was just this month. She is tolerating it well. Before that she was on Novantrone and before that [place].  She is walking better now and could walk a mile without stopping.   She is not using her cane.    Balance is mildly wobbly at times but better than in September.       Vision is doing well.    Bladder is now doing well without any med's.    She is sleeping well at night and her fatigue is mild.      Mood is doing well and she is on Prozac.Marland Kitchen      She takes vitamin D supplements for her low vitamin D.  Update 11/13/2016:   Ann Oconnor was last seen 2 weeks ago. She was stable at that time on ocrelizumab.   Over the weekend, she noted more difficulty with gait.   She notes her right leg is mildly weaker and she is throwing the foot put as she walks.  Balance is mildly worse.   She started using her cane again.    She denies any falls. She denies any fevers or infections.   Bladder function is fine.   Vision is unchanged.   She has fatigue and amantadine was tried after the last visit.   She noted no improvement and stopped.     Adderall was not tolerated in the past.    She sleeps well at night.  At last visit we checked vitamin D. It was low and she is taking high-dose supplements and we'll switch to OTC supplements in a few months.  Update 10/29/2016:  She is a ocrelizumab. Her next infusion is later this week. She has not had any exacerbations while on ocrelizumab and she tolerates it well. Before that she was on Novantrone which helped to stabilize her MS but she needed to switch due to the lifetime limited.  She feels gait has improved further this year. Specifically she is walking well without a cane. She is able to do about 1 mile. She does need to look down at the ground or her feet when she walks for better balance and she did have 1 fall.    She continues to have numbness in both legs that has mildly improved over the last year. Bladder function is doing well and she has not had any incontinence since her last visit. She continues to experience fatigue that is physical more than cognitive. Adderall made her jittery so she stopped. She does not think she has ever been on amantadine.   __________________________________ From 04/19/2016: MS:    She switched from Novantrone to ocrelizumab in July 2017.     She tolerated the infusion well.  She had a total of 5 Novantrone treatments at 12 mg/M2.     She continues to have some deficits from her severe exacerbations early 2016.                                                                     Gait/strength:  She is able to walk much better and has walked a mie without a cane but that tires her out .   She regularly walks >  100 yards without rest.     She can climb a short flight of stairs.   She is driving now short distances.  She has mild weakness and spasticity in legs but not arms   Gait issues are due more to balance than strength.     Numbness:   She has bilateral fairly symmetric numbness in her arms and legs since the 2016 exaceration.    Numbness is not complete as  it was but is still severe.   She had normal NCV.    Numbness is slightly tingling but not painful.   She is on gabapentin 400 mg twice a day with benefit   Bladder/bowel:   She reports no incontinence since last visit.     No significant frequency.    Vision:  She denies MS related visual issues.  Fatigue/sleep:  She has fatigue most days, worse if more active and worse in the afternoons.   Fatigue is more cognitive many days than physical.    She is able to sleep fairly well at night.  Mood/cognition:   Mood is much better than in 2016 and early 2017.  She has mild depression and anxiety, helped by Paxil.  Her cognitive issues have returned close to baseline.  She notes some verbal fluency issues.    MS:    She presented in 2002 with numbness in her legs and poor gait.  The MRI scan showed MS plaques in the spinal cord and the brain and she was diagnosed with multiple sclerosis. She was initially placed on Rebif. At first she did well with only a couple of small central exacerbations. However, 2003 she had a more severe exacerbation that affected her gait. She was placed on a combination Rebif and Copaxone for a short period of time and then was on Copaxone monotherapy. She switched to [place] in 2011 due to needle fatigue. She had numbness in her feet that would not go away and was switched to Tecfidera from [place] in November 2015.  In January 2016, she lost strength in both legs and urinary incontinence. She had a course of IV steroids without any benefit in late January. Because there was no benefit, she was admitted to [facility] for a course of plasmapheresis starting February 5. She got no benefit from the plasmapheresis. During the next week, she was home but she was doing worse and worse. She then presented to [facility]. She was admitted for another week of steroids. An MRI done at admission showed very aggressive MS changes.    Brain 04/28/2014 shows multiple infratentorial plaques  including left greater than right middle cerebellar peduncles, bilateral pons, and cerebellum. Additionally there are multiple foci in the hemispheres, some with a concentric demyelinating pattern that could be consistent with Balos concentric sclerosis, an especially fulminant form of multiple sclerosis. She was unable to complete the examination or get contrast due to the need to terminate the study early.   When compared to an MRI dated 02/07/2012, there has been dramatic change with multiple new brain stem and hemispheric foci.  MRI of the cervical spine 10/26/2010. showed multiple plaques:  right at the C2 level, left aspect C2-3 level, right aspect C3 level, the posterior central aspect C3 level, upper C6 level slightly greater to the right and C7 level greater to the left.  Abnormal signal within the left aspect of the cord at the T3- T4 level.   MRI of the brain in 05/19/2014 showed several additional posterior fossa  lesions prompting initiation of Novantrone.  Marland Kitchen     REVIEW OF SYSTEMS: Constitutional: No fevers, chills, sweats, or change in appetite.  Fatigue Eyes: No visual changes, double vision, eye pain Ear, nose and throat: No hearing loss, ear pain, nasal congestion, sore throat Cardiovascular: No chest pain, palpitations Respiratory: No shortness of breath at rest or with exertion.   No wheezes GastrointestinaI: No nausea, vomiting.  Some fecal incontinence Genitourinary: as above. Musculoskeletal: No neck pain, back pain Integumentary: No rash, pruritus, skin lesions Neurological: as above Psychiatric: Notes depression at this time.  Some anxiety Endocrine: No palpitations, diaphoresis, change in appetite, change in weigh or increased thirst Hematologic/Lymphatic: No anemia, purpura, petechiae. Allergic/Immunologic: No itchy/runny eyes, nasal congestion, recent allergic reactions, rashes  ALLERGIES: No Known Allergies  HOME MEDICATIONS:  Current Outpatient Medications:     acetaminophen (TYLENOL) 500 MG tablet, Take 1,500 mg by mouth daily as needed for moderate pain or headache., Disp: , Rfl:    Cholecalciferol (VITAMIN D PO), Take 5,000 Units by mouth daily. , Disp: , Rfl:    desvenlafaxine (PRISTIQ) 100 MG 24 hr tablet, TAKE 1 TABLET(100 MG) BY MOUTH DAILY, Disp: 30 tablet, Rfl: 5   gabapentin (NEURONTIN) 400 MG capsule, TAKE 1 CAPSULE(400 MG) BY MOUTH TWICE DAILY (Patient taking differently: Take 400 mg by mouth 2 (two) times daily. ), Disp: 180 capsule, Rfl: 3   ocrelizumab 600 mg in sodium chloride 0.9 % 500 mL, Inject 600 mg into the vein every 6 (six) months., Disp: , Rfl:  No current facility-administered medications for this visit.   Facility-Administered Medications Ordered in Other Visits:    gadopentetate dimeglumine (MAGNEVIST) injection 20 mL, 20 mL, Intravenous, Once PRN, Ann Oconnor, Nanine Means, MD   gadopentetate dimeglumine (MAGNEVIST) injection 20 mL, 20 mL, Intravenous, Once PRN, Antavius Sperbeck, Nanine Means, MD  PAST MEDICAL HISTORY: Past Medical History:  Diagnosis Date   Anxiety    Family history of adverse reaction to anesthesia    Father - difficulty awaken- has sleep apena   Gait instability    Head injury, closed, with brief LOC ([place]) 2003   fall   Incontinence    LGSIL (low grade squamous intraepithelial dysplasia) 03/2015   positive high risk HPV subtype 18/45.  Colpo normal with neg ECC   Movement disorder    MS (multiple sclerosis) ([place]) 2002   Seizures ([place]) 2003   none since 2003   UTI (lower urinary tract infection)    Vision abnormalities     PAST SURGICAL HISTORY: Past Surgical History:  Procedure Laterality Date   CERVICAL CONE BIOPSY  2008   CIN 2   COLONOSCOPY     PORT-A-CATH REMOVAL N/A 02/16/2016   Procedure: REMOVAL PORT-A-CATH;  Surgeon: Donnie Mesa, MD;  Location: [facility];  Service: General;  Laterality: N/A;   PORTACATH PLACEMENT Left 08/18/14   PORTACATH PLACEMENT Left 08/18/2014   Procedure: LEFT  SUBCLAVIAN VEIN PORT PLACEMENT;  Surgeon: Donnie Mesa, MD;  Location: [facility];  Service: General;  Laterality: Left;    FAMILY HISTORY: Family History  Problem Relation Age of Onset   Thyroid disease Mother    Heart disease Father    Mitral valve prolapse Father    Cancer Father        lung/brain- tumors   Thyroid disease Sister    Cancer Paternal Aunt        not sure what kind, maybe cervix   Thyroid disease Sister    Breast cancer Neg Hx  SOCIAL HISTORY:  Social History   Socioeconomic History   Marital status: Single    Spouse name: Not on file   Number of children: Not on file   Years of education: Not on file   Highest education level: Not on file  Occupational History   Not on file  Social Needs   Financial resource strain: Not on file   Food insecurity    Worry: Not on file    Inability: Not on file   Transportation needs    Medical: Not on file    Non-medical: Not on file  Tobacco Use   Smoking status: Never Smoker   Smokeless tobacco: Never Used  Substance and Sexual Activity   Alcohol use: No   Drug use: No   Sexual activity: Not Currently    Birth control/protection: None    Comment: 1st intercourse- 23, partners- 3,   Lifestyle   Physical activity    Days per week: Not on file    Minutes per session: Not on file   Stress: Not on file  Relationships   Social connections    Talks on phone: Not on file    Gets together: Not on file    Attends religious service: Not on file    Active member of club or organization: Not on file    Attends meetings of clubs or organizations: Not on file    Relationship status: Not on file   Intimate partner violence    Fear of current or ex partner: Not on file    Emotionally abused: Not on file    Physically abused: Not on file    Forced sexual activity: Not on file  Other Topics Concern   Not on file  Social History Narrative   Not on file     PHYSICAL EXAM  Vitals:    11/25/18 1526  BP: 107/75  Pulse: 74  Temp: 97.7 F (36.5 C)  Weight: 186 lb (84.4 kg)  Height: 5\' 4"  (1.626 m)    Body mass index is 31.93 kg/m.   General: The patient is well-developed and well-nourished and in no acute distress   Neurologic Exam  Mental status: The patient is alert and oriented x 3 at the time of the examination. The patient has apparent normal recent and remote memory,  attention span and concentration ability now seem normal.   Speech is normal.     Cranial nerves: Extraocular movements are full. Facial strength and sensation is normal. No dysarthria is noted.  No obvious hearing deficits are noted.  Motor:  Muscle bulk is normal.   Tone is mildly increased in legs, left greater than right.. Strength is  5/5 in arms and legs now except 4+/5 left EHL.   Sensory: Sensory testing is intact to pinprick, soft touch and vibration sensation in arms and legs now.   Coordination: Cerebellar testing reveals good finger-nose-finger.   Heels to shin is reduced on the left   Gait and station: She has a normal station.  The gait is slightly wide.  Tandem gait is wide.  Romberg is negative.  Reflexes: Deep tendon reflexes are increased bilaterally in legs.        DIAGNOSTIC DATA (LABS, IMAGING, TESTING) - I reviewed patient records, labs, notes, testing and imaging myself where available.  Lab Results  Component Value Date   WBC 4.4 05/05/2018   HGB 12.2 05/05/2018   HCT 36.1 05/05/2018   MCV 94 05/05/2018   PLT 321 05/05/2018  Component Value Date/Time   NA 143 10/30/2016 0948   K 5.1 10/30/2016 0948   CL 104 10/30/2016 0948   CO2 22 10/30/2016 0948   GLUCOSE 96 10/30/2016 0948   GLUCOSE 86 02/16/2016 0851   BUN 6 10/30/2016 0948   CREATININE 0.67 10/30/2016 0948   CREATININE 0.75 09/02/2012 1057   CALCIUM 9.1 10/30/2016 0948   PROT 6.9 10/30/2016 0948   ALBUMIN 4.5 10/30/2016 0948   AST 20 10/30/2016 0948   ALT 8 10/30/2016 0948   ALKPHOS 82  10/30/2016 0948   BILITOT 0.3 10/30/2016 0948   GFRNONAA 109 10/30/2016 0948   GFRAA 125 10/30/2016 0948   Lab Results  Component Value Date   CHOL 143 10/30/2017   HDL 62 10/30/2017   LDLCALC 68 10/30/2017   TRIG 64 10/30/2017   CHOLHDL 2.3 10/30/2017   No results found for: HGBA1C Lab Results  Component Value Date   [id number] 04/04/2015   Lab Results  Component Value Date   TSH 2.871 08/21/2011       ASSESSMENT AND PLAN  Relapsing remitting multiple sclerosis ([place]) - Plan: CBC with Differential/Platelet, IgG, IgA, IgM  Vitamin D deficiency  Numbness  Ataxic gait  Frequent UTI  Multiple sclerosis ([place]) - Plan: MR BRAIN WO CONTRAST  High risk medication use - Plan: CBC with Differential/Platelet, IgG, IgA, IgM   1.  She will continue ocrelizumab.  The IgM and IgA were low .  We will recheck labs and change to every 8 months for the dosing interval. 2.   Continue vitamin D supplements 3.   Stay active and try to lose weight if possible.   4.   Due to gait issues will need a form for a handicap placard Return in 6 months but call sooner if there are new or worsening neurologic symptoms.   Grae Leathers A. Felecia Shelling, MD, PhD, FAAN Certified in Neurology, Clinical Neurophysiology, Sleep Medicine, Pain Medicine and Neuroimaging Director, [place] at [facility] [address] [place], [place] [address] [phone]

## 2018-11-26 ENCOUNTER — Telehealth: Payer: Self-pay | Admitting: *Deleted

## 2018-11-26 LAB — IGG, IGA, IGM
IgA/Immunoglobulin A, Serum: 55 mg/dL — ABNORMAL LOW (ref 87–352)
IgG (Immunoglobin G), Serum: 801 mg/dL (ref 586–1602)
IgM (Immunoglobulin M), Srm: 14 mg/dL — ABNORMAL LOW (ref 26–217)

## 2018-11-26 LAB — CBC WITH DIFFERENTIAL/PLATELET
Basophils Absolute: 0.1 10*3/uL (ref 0.0–0.2)
Basos: 1 %
EOS (ABSOLUTE): 0.1 10*3/uL (ref 0.0–0.4)
Eos: 1 %
Hematocrit: 37.3 % (ref 34.0–46.6)
Hemoglobin: 12.5 g/dL (ref 11.1–15.9)
Immature Grans (Abs): 0 10*3/uL (ref 0.0–0.1)
Immature Granulocytes: 0 %
Lymphocytes Absolute: 1.8 10*3/uL (ref 0.7–3.1)
Lymphs: 26 %
MCH: 30.6 pg (ref 26.6–33.0)
MCHC: 33.5 g/dL (ref 31.5–35.7)
MCV: 91 fL (ref 79–97)
Monocytes Absolute: 0.7 10*3/uL (ref 0.1–0.9)
Monocytes: 10 %
Neutrophils Absolute: 4.4 10*3/uL (ref 1.4–7.0)
Neutrophils: 62 %
Platelets: 332 10*3/uL (ref 150–450)
RBC: 4.08 x10E6/uL (ref 3.77–5.28)
RDW: 12.3 % (ref 11.7–15.4)
WBC: 7 10*3/uL (ref 3.4–10.8)

## 2018-11-26 NOTE — Telephone Encounter (Signed)
-----   Message from Britt Bottom, MD sent at 11/26/2018 10:11 AM EDT ----- Please let the patient know that the IgG and IgM were still low.  I spoke to [facility]. [facility] about changing her to every 8 months

## 2018-11-27 ENCOUNTER — Telehealth: Payer: Self-pay | Admitting: Neurology

## 2018-11-27 NOTE — Telephone Encounter (Signed)
[employer] medicare order sent to [facility]. No auth they will reach out to the patient to schedule.  

## 2018-12-13 ENCOUNTER — Other Ambulatory Visit: Payer: Self-pay

## 2018-12-13 ENCOUNTER — Ambulatory Visit
Admission: RE | Admit: 2018-12-13 | Discharge: 2018-12-13 | Disposition: A | Discharge status: Home / Self Care | Payer: Medicare Other | Source: Ambulatory Visit | Attending: Neurology | Admitting: Neurology

## 2018-12-13 DIAGNOSIS — G35 Multiple sclerosis: Secondary | ICD-10-CM

## 2018-12-15 ENCOUNTER — Telehealth: Payer: Self-pay | Admitting: *Deleted

## 2018-12-15 NOTE — Telephone Encounter (Signed)
-----   Message from Britt Bottom, MD sent at 12/13/2018  5:53 PM EDT ----- Please let her know that the MRI of the brain was unchanged.

## 2018-12-20 ENCOUNTER — Other Ambulatory Visit: Payer: Self-pay | Admitting: Neurology

## 2019-01-28 ENCOUNTER — Ambulatory Visit: Payer: Medicare Other | Admitting: Orthopaedic Surgery

## 2019-01-28 ENCOUNTER — Encounter: Payer: Self-pay | Admitting: Orthopaedic Surgery

## 2019-01-28 DIAGNOSIS — M722 Plantar fascial fibromatosis: Secondary | ICD-10-CM

## 2019-01-28 MED ORDER — METHYLPREDNISOLONE ACETATE 40 MG/ML IJ SUSP
40.0000 mg | INTRAMUSCULAR | Status: AC | PRN
  Administered 2019-01-28: 40 mg

## 2019-01-28 MED ORDER — LIDOCAINE HCL 1 % IJ SOLN
0.5000 mL | INTRAMUSCULAR | Status: AC | PRN
  Administered 2019-01-28: .5 mL

## 2019-01-28 NOTE — Progress Notes (Signed)
Office Visit Note   Patient: Ann Oconnor           Date of Birth: Jun 08, 1975           MRN: [id number] Visit Date: 01/28/2019              Requested by: Marton Redwood, MD [address] [place],  [place] [address] PCP: Marton Redwood, MD   Assessment & Plan: Visit Diagnoses:  1. Plantar fasciitis of right foot     Plan: Her exam and signs and symptoms are consistent with plantar fasciitis. I will have her work on stretching exercises. I did offer steroid injection over the point of maximum tenderness and she agreed to this and tolerated it well. I explained the risk and benefits of steroid injections. I have also recommended topical Voltaren gel. All questions concerns were answered and addressed. I told her I would certainly repeat injection in 6 weeks if she is still having symptoms.  Follow-Up Instructions: No follow-ups on file.   Orders:  Orders Placed This Encounter  Procedures  . Foot Inj   No orders of the defined types were placed in this encounter.     Procedures: Foot Inj  Date/Time: 01/28/2019 4:53 PM Performed by: Mcarthur Rossetti, MD Authorized by: Mcarthur Rossetti, MD   Condition: Plantar Fasciitis   Location: right plantar fascia muscle   Medications:  0.5 mL lidocaine 1 %; 40 mg methylPREDNISolone acetate 40 MG/ML     Clinical Data: No additional findings.   Subjective: Chief Complaint  Patient presents with  . Right Foot - Pain  The patient comes in today with right foot pain this been going on for about 2 weeks. She points to the heel right at her arch as a source of her pain. She denies any injuries that she is aware of. She says it hurts with walking mainly. Sometimes her step in the morning is painful. She does not wear any specific inserts but she does wear firm shoes. She is not diabetic. She denies any numbness and tingling or any ankle pain.  HPI  Review of Systems She currently denies any headache, chest pain,  shortness of breath, fever, chills, nausea, vomiting  Objective: Vital Signs: There were no vitals taken for this visit.  Physical Exam She is alert and orient x3 and in no acute distress Ortho Exam Examination of her right foot only shows pain to palpation just in the arch and at the plantar fascia area but the remainder of her foot exam is entirely normal. There is no open wounds. There is a normal motor and sensory function. Her ankle and hindfoot are ligamentously stable. There is no pain over the Achilles. Specialty Comments:  No specialty comments available.  Imaging: No results found.   PMFS History: Patient Active Problem List   Diagnosis Date Noted  . Frequent UTI 11/25/2018  . Hyperlipidemia 10/29/2016  . Vitamin D deficiency 10/29/2016  . Numbness 04/19/2016  . Neck pain 10/18/2015  . Occipital neuralgia of left side 10/18/2015  . Polyneuropathy 04/04/2015  . Ataxic gait 12/24/2014  . Left carpal tunnel syndrome 09/27/2014  . Encounter for chemotherapy management 07/06/2014  . High risk medication use 05/14/2014  . Urinary incontinence 05/14/2014  . Depression with anxiety 05/06/2014  . Lower extremity weakness 04/28/2014  . Numbness of both lower extremities 04/28/2014  . Multiple falls 04/28/2014  . Nausea & vomiting 04/28/2014  . Relapsing remitting multiple sclerosis ([place]) 07/04/2012  . Memory loss  12/03/2011  . Multiple sclerosis ([place]) 09/14/2008  . Constipation 09/14/2008  . RUQ PAIN 09/14/2008   Past Medical History:  Diagnosis Date  . Anxiety   . Family history of adverse reaction to anesthesia    Father - difficulty awaken- has sleep apena  . Gait instability   . Head injury, closed, with brief LOC ([place]) 2003   fall  . Incontinence   . 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  Colpo normal with neg ECC  . Movement disorder   . MS (multiple sclerosis) ([place]) 2002  . Seizures ([place]) 2003    none since 2003  . UTI (lower urinary tract infection)   . Vision abnormalities     Family History  Problem Relation Age of Onset  . Thyroid disease Mother   . Heart disease Father   . Mitral valve prolapse Father   . Cancer Father        lung/brain- tumors  . Thyroid disease Sister   . Cancer Paternal Aunt        not sure what kind, maybe cervix  . Thyroid disease Sister   . Breast cancer Neg Hx     Past Surgical History:  Procedure Laterality Date  . CERVICAL CONE BIOPSY  2008   CIN 2  . COLONOSCOPY    . PORT-A-CATH REMOVAL N/A 02/16/2016   Procedure: REMOVAL PORT-A-CATH;  Surgeon: Donnie Mesa, MD;  Location: [facility];  Service: General;  Laterality: N/A;  . PORTACATH PLACEMENT Left 08/18/14  . PORTACATH PLACEMENT Left 08/18/2014   Procedure: LEFT SUBCLAVIAN VEIN PORT PLACEMENT;  Surgeon: Donnie Mesa, MD;  Location: [facility];  Service: General;  Laterality: Left;   Social History   Occupational History  . Not on file  Tobacco Use  . Smoking status: Never Smoker  . Smokeless tobacco: Never Used  Substance and Sexual Activity  . Alcohol use: No  . Drug use: No  . Sexual activity: Not Currently    Birth control/protection: None    Comment: 1st intercourse- 23, partners- 3,

## 2019-02-17 ENCOUNTER — Other Ambulatory Visit: Payer: Self-pay | Admitting: Neurology

## 2019-04-01 ENCOUNTER — Encounter: Payer: Self-pay | Admitting: Orthopaedic Surgery

## 2019-04-01 ENCOUNTER — Other Ambulatory Visit: Payer: Self-pay

## 2019-04-01 ENCOUNTER — Ambulatory Visit: Payer: Medicare Other | Admitting: Orthopaedic Surgery

## 2019-04-01 DIAGNOSIS — M722 Plantar fascial fibromatosis: Secondary | ICD-10-CM

## 2019-04-01 MED ORDER — METHYLPREDNISOLONE ACETATE 40 MG/ML IJ SUSP
40.0000 mg | INTRAMUSCULAR | Status: AC | PRN
  Administered 2019-04-01: 40 mg

## 2019-04-01 MED ORDER — LIDOCAINE HCL 1 % IJ SOLN
1.0000 mL | INTRAMUSCULAR | Status: AC | PRN
  Administered 2019-04-01: 1 mL

## 2019-04-01 NOTE — Progress Notes (Signed)
Office Visit Note   Patient: Ann Oconnor           Date of Birth: 1975/06/14           MRN: [id number] Visit Date: 04/01/2019              Requested by: Marton Redwood, MD [address] [place],  [place] [address] PCP: Marton Redwood, MD   Assessment & Plan: Visit Diagnoses:  1. Plantar fasciitis of right foot     Plan: Per her wishes I did provide a steroid injection again in the plantar fascia of her right foot.  I am also recommending at this point a night splint and better shoe wear.  After she gets back from her trip if she would like [place] to set her up for physical therapy for her foot she will give [place] a call.  All question concerns were answered addressed.  Follow-Up Instructions: Return if symptoms worsen or fail to improve.   Orders:  Orders Placed This Encounter  Procedures  . Foot Inj   No orders of the defined types were placed in this encounter.     Procedures: Foot Inj  Date/Time: 04/01/2019 4:14 PM Performed by: Mcarthur Rossetti, MD Authorized by: Mcarthur Rossetti, MD   Condition: Plantar Fasciitis   Location: right plantar fascia muscle   Medications:  1 mL lidocaine 1 %; 40 mg methylPREDNISolone acetate 40 MG/ML     Clinical Data: No additional findings.   Subjective: Chief Complaint  Patient presents with  . Right Foot - Follow-up, Pain  Patient is well-known to me.  She does have a history of plantar fasciitis of her right foot.  It is flared up on her again.  She has tried stretching exercises and has had a steroid injection in the past.  She is also tried Voltaren gel.  I did look at her shoes and her shoes are very cushioned knee with no firmness to them at all and you can bend them easily.  I did talk to her about different shoe wear.  She would like an injection today since she is going to be gone for 3 weeks to the [facility]  HPI  Review of Systems She currently denies any headache, chest pain, shortness of breath, fever,  chills, nausea, vomiting she denies any numbness and tingling in her feet.  She is not a diabetic.  Objective: Vital Signs: There were no vitals taken for this visit.  Physical Exam She is alert and orient x3 and in no acute distress Ortho Exam Examination of her right foot shows a normal-appearing arch.  She has pain along the plantar fascia and the heel.  Her Achilles is intact and does not feel stiff or tight. Specialty Comments:  No specialty comments available.  Imaging: No results found.   PMFS History: Patient Active Problem List   Diagnosis Date Noted  . Frequent UTI 11/25/2018  . Hyperlipidemia 10/29/2016  . Vitamin D deficiency 10/29/2016  . Numbness 04/19/2016  . Neck pain 10/18/2015  . Occipital neuralgia of left side 10/18/2015  . Polyneuropathy 04/04/2015  . Ataxic gait 12/24/2014  . Left carpal tunnel syndrome 09/27/2014  . Encounter for chemotherapy management 07/06/2014  . High risk medication use 05/14/2014  . Urinary incontinence 05/14/2014  . Depression with anxiety 05/06/2014  . Lower extremity weakness 04/28/2014  . Numbness of both lower extremities 04/28/2014  . Multiple falls 04/28/2014  . Nausea & vomiting 04/28/2014  . Relapsing remitting  multiple sclerosis ([place]) 07/04/2012  . Memory loss 12/03/2011  . Multiple sclerosis ([place]) 09/14/2008  . Constipation 09/14/2008  . RUQ PAIN 09/14/2008   Past Medical History:  Diagnosis Date  . Anxiety   . Family history of adverse reaction to anesthesia    Father - difficulty awaken- has sleep apena  . Gait instability   . Head injury, closed, with brief LOC ([place]) 2003   fall  . Incontinence   . 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  Colpo normal with neg ECC  . Movement disorder   . MS (multiple sclerosis) ([place]) 2002  . Seizures ([place]) 2003   none since 2003  . UTI (lower urinary tract infection)   . Vision abnormalities     Family  History  Problem Relation Age of Onset  . Thyroid disease Mother   . Heart disease Father   . Mitral valve prolapse Father   . Cancer Father        lung/brain- tumors  . Thyroid disease Sister   . Cancer Paternal Aunt        not sure what kind, maybe cervix  . Thyroid disease Sister   . Breast cancer Neg Hx     Past Surgical History:  Procedure Laterality Date  . CERVICAL CONE BIOPSY  2008   CIN 2  . COLONOSCOPY    . PORT-A-CATH REMOVAL N/A 02/16/2016   Procedure: REMOVAL PORT-A-CATH;  Surgeon: Donnie Mesa, MD;  Location: [place];  Service: General;  Laterality: N/A;  . PORTACATH PLACEMENT Left 08/18/14  . PORTACATH PLACEMENT Left 08/18/2014   Procedure: LEFT SUBCLAVIAN VEIN PORT PLACEMENT;  Surgeon: Donnie Mesa, MD;  Location: [place];  Service: General;  Laterality: Left;   Social History   Occupational History  . Not on file  Tobacco Use  . Smoking status: Never Smoker  . Smokeless tobacco: Never Used  Substance and Sexual Activity  . Alcohol use: No  . Drug use: No  . Sexual activity: Not Currently    Birth control/protection: None    Comment: 1st intercourse- 23, partners- 3,

## 2019-05-25 ENCOUNTER — Other Ambulatory Visit: Payer: Self-pay

## 2019-05-25 ENCOUNTER — Encounter: Payer: Self-pay | Admitting: Neurology

## 2019-05-25 ENCOUNTER — Ambulatory Visit: Payer: Medicare Other | Admitting: Neurology

## 2019-05-25 ENCOUNTER — Telehealth: Payer: Self-pay | Admitting: *Deleted

## 2019-05-25 VITALS — BP 100/70 | HR 69 | Temp 97.4°F | Ht 64.0 in | Wt 194.5 lb

## 2019-05-25 DIAGNOSIS — G35 Multiple sclerosis: Secondary | ICD-10-CM

## 2019-05-25 DIAGNOSIS — R260 Ataxic gait: Secondary | ICD-10-CM | POA: Diagnosis not present

## 2019-05-25 DIAGNOSIS — Z79899 Other long term (current) drug therapy: Secondary | ICD-10-CM

## 2019-05-25 DIAGNOSIS — M21372 Foot drop, left foot: Secondary | ICD-10-CM | POA: Diagnosis not present

## 2019-05-25 NOTE — Telephone Encounter (Signed)
Placed JCV lab in [facility] lock box for routine lab pick up. Results pending. 

## 2019-05-25 NOTE — Progress Notes (Addendum)
[facility]  PATIENT: Ann Oconnor DOB: 03-Jul-1975  REFERRING DOCTOR OR PCP:  Delia Chimes SOURCE: Patient, records, images  _________________________________   HISTORICAL  CHIEF COMPLAINT:  Chief Complaint  Patient presents with  . Follow-up    RM 13, with mother (temp: 97.3). Last seen 11/25/2018  . Multiple Sclerosis    On Ocrevus.Last:09/29/2018. Next infusion: 06/03/2019. She wants to discuss other DMT optoins. Receives q35months. Also wants to discuss electronic leg stimulus cuff for left leg.     HISTORY OF PRESENT ILLNESS:  Ann Oconnor is a 44 y.o. woman with aggressive relapsing remitting MS who was diagnosed in 2002.   She had a very severe series of exacerbations in 2016 after stopping Gilenya.     Update 05/25/2019: She has been on Ocrevus --- her last infusion was July 2020.   Her next infusion will be next week.   We stretched to every 8 months due to low IgM and IgA (IgG ws ok).  She asked about ofatumumab we discussed it is very similar to ocrelizumab though is self injectable.  We would still have to be concerned about low antibody levels.  She feels her gait is doing a little worse, especially the last month and she feels her balance is off.  Her last MRi was 12/13/2018.   Her left leg is worse than the right.    She has a foot drop that worsens with longer distances.   She wanted to discuss the Bioness device.   Arm strength is good.  She notes no difficulties with her vision.  She has some fatigue but feels this is stable.  She sleeps well most nights.  Mood and cognition is doing well.  Update 11/25/2018: She has been on Ocrevus x 3 years, last infusion ws July 2020.    Her IgG and IgM have been low and we discussed stretching the Ocrevus out to every 8 months.     No URI but had bad UTI a few months ago.  She had chills with shaking but did not have a fever.   She has had a few bad UTIs but does not note issues with urinary hesitancy or  problems emptying.     Gait is doing well.  She notes some left leg weakness but it is stable and much better than a few years ago.    She has mild left leg numbness.  Vision is fine.  She has some fatigue.   She is sleeping well.   Notes no changes in cognition and mood is doing well.     Update 05/05/2018:  She has been on Ocrevus since 2017 and is tolerating it well.   The last infusion was 03/31/2018.  IgM has been low at 14 (26-217 normal) and IgA is mildly low at 59 (87 up normal) but these are stable.   No exacerbations or new symptoms.   She did not do the flu shot this year and we discussed getting it.    Gait is ok and she does not use a cane.   When tired, she has some gait issues due to left > right leg weakness.   She exercises almost every day.  Her left leg is mildly spastic.       She has mild leg numbness.    Bladder is doing well.   Vision is fine.   She has no fatigue most days.    She sleeps well most nights.   Mood and  cognition are doing well.    Update 10/30/2017: She is doing well.   She has been on Ocrevus for close to  Years and was on Novantrone before that for 4-5 cycles.     She denies any exacerbations.     She has no side effects form Ocrevus and has insomnia from the steroid x 1 night.     Labwork from earlier this year showed IgM was low at 14 (26-217) and IgG and CBC/diff were normal.   Her gait is doing well and she can climb stairs.     She denies numbness in her legs but has mild left sided weakness (old and much better than in 2016).   Bladder is doing well.  She denies much fatigue and is going to the gym daily.    She sleeps well most night.   Mood is doing well.    Cognition is fine.      Update 05/01/2017:  She had a small exacerbation in September 2018 with right leg weakness. She received a couple days of IV steroids and improved. She now feels that she is doing well. Her last (this infusion was just this month. She is tolerating it well. Before that she was on  Novantrone and before that [place].  She is walking better now and could walk a mile without stopping.   She is not using her cane.    Balance is mildly wobbly at times but better than in September.       Vision is doing well.    Bladder is now doing well without any med's.    She is sleeping well at night and her fatigue is mild.      Mood is doing well and she is on Prozac.Marland Kitchen      She takes vitamin D supplements for her low vitamin D.  Update 11/13/2016:   Dedre was last seen 2 weeks ago. She was stable at that time on ocrelizumab.   Over the weekend, she noted more difficulty with gait.   She notes her right leg is mildly weaker and she is throwing the foot put as she walks.  Balance is mildly worse.   She started using her cane again.    She denies any falls. She denies any fevers or infections.   Bladder function is fine.   Vision is unchanged.   She has fatigue and amantadine was tried after the last visit.   She noted no improvement and stopped.    Adderall was not tolerated in the past.    She sleeps well at night.  At last visit we checked vitamin D. It was low and she is taking high-dose supplements and we'll switch to OTC supplements in a few months.  Update 10/29/2016:  She is a ocrelizumab. Her next infusion is later this week. She has not had any exacerbations while on ocrelizumab and she tolerates it well. Before that she was on Novantrone which helped to stabilize her MS but she needed to switch due to the lifetime limited.  She feels gait has improved further this year. Specifically she is walking well without a cane. She is able to do about 1 mile. She does need to look down at the ground or her feet when she walks for better balance and she did have 1 fall.    She continues to have numbness in both legs that has mildly improved over the last year. Bladder function is doing well and she has not  had any incontinence since her last visit. She continues to experience fatigue that is physical more  than cognitive. Adderall made her jittery so she stopped. She does not think she has ever been on amantadine.   __________________________________ From 04/19/2016: MS:    She switched from Novantrone to ocrelizumab in July 2017.     She tolerated the infusion well.  She had a total of 5 Novantrone treatments at 12 mg/M2.     She continues to have some deficits from her severe exacerbations early 2016.                                                                     Gait/strength:  She is able to walk much better and has walked a mie without a cane but that tires her out .   She regularly walks > 100 yards without rest.     She can climb a short flight of stairs.   She is driving now short distances.  She has mild weakness and spasticity in legs but not arms   Gait issues are due more to balance than strength.     Numbness:   She has bilateral fairly symmetric numbness in her arms and legs since the 2016 exaceration.    Numbness is not complete as it was but is still severe.   She had normal NCV.    Numbness is slightly tingling but not painful.   She is on gabapentin 400 mg twice a day with benefit   Bladder/bowel:   She reports no incontinence since last visit.     No significant frequency.    Vision:  She denies MS related visual issues.  Fatigue/sleep:  She has fatigue most days, worse if more active and worse in the afternoons.   Fatigue is more cognitive many days than physical.    She is able to sleep fairly well at night.  Mood/cognition:   Mood is much better than in 2016 and early 2017.  She has mild depression and anxiety, helped by Paxil.  Her cognitive issues have returned close to baseline.  She notes some verbal fluency issues.    MS:    She presented in 2002 with numbness in her legs and poor gait.  The MRI scan showed MS plaques in the spinal cord and the brain and she was diagnosed with multiple sclerosis. She was initially placed on Rebif. At first she did well with only a  couple of small central exacerbations. However, 2003 she had a more severe exacerbation that affected her gait. She was placed on a combination Rebif and Copaxone for a short period of time and then was on Copaxone monotherapy. She switched to [place] in 2011 due to needle fatigue. She had numbness in her feet that would not go away and was switched to [place] from [place] in November 2015.  In January 2016, she lost strength in both legs and urinary incontinence. She had a course of IV steroids without any benefit in late January. Because there was no benefit, she was admitted to [facility] for a course of plasmapheresis starting February 5. She got no benefit from the plasmapheresis. During the next week, she was home but she was doing worse and worse. She then  presented to [facility]. She was admitted for another week of steroids. An MRI done at admission showed very aggressive MS changes.    Brain 04/28/2014 shows multiple infratentorial plaques including left greater than right middle cerebellar peduncles, bilateral pons, and cerebellum. Additionally there are multiple foci in the hemispheres, some with a concentric demyelinating pattern that could be consistent with Balo's concentric sclerosis, an especially fulminant form of multiple sclerosis. She was unable to complete the examination or get contrast due to the need to terminate the study early.   When compared to an MRI dated 02/07/2012, there has been dramatic change with multiple new brain stem and hemispheric foci.  MRI of the cervical spine 10/26/2010. showed multiple plaques:  right at the C2 level, left aspect C2-3 level, right aspect C3 level, the posterior central aspect C3 level, upper C6 level slightly greater to the right and C7 level greater to the left.  Abnormal signal within the left aspect of the cord at the T3- T4 level.   MRI of the brain in 05/19/2014 showed several additional posterior fossa lesions prompting initiation of  Novantrone.  Marland Kitchen     REVIEW OF SYSTEMS: Constitutional: No fevers, chills, sweats, or change in appetite.  Fatigue Eyes: No visual changes, double vision, eye pain Ear, nose and throat: No hearing loss, ear pain, nasal congestion, sore throat Cardiovascular: No chest pain, palpitations Respiratory: No shortness of breath at rest or with exertion.   No wheezes GastrointestinaI: No nausea, vomiting.  Some fecal incontinence Genitourinary: as above. Musculoskeletal: No neck pain, back pain Integumentary: No rash, pruritus, skin lesions Neurological: as above Psychiatric: Notes depression at this time.  Some anxiety Endocrine: No palpitations, diaphoresis, change in appetite, change in weigh or increased thirst Hematologic/Lymphatic: No anemia, purpura, petechiae. Allergic/Immunologic: No itchy/runny eyes, nasal congestion, recent allergic reactions, rashes  ALLERGIES: No Known Allergies  HOME MEDICATIONS:  Current Outpatient Medications:  .  acetaminophen (TYLENOL) 500 MG tablet, Take 1,500 mg by mouth daily as needed for moderate pain or headache., Disp: , Rfl:  .  Cholecalciferol (VITAMIN D PO), Take 5,000 Units by mouth daily. , Disp: , Rfl:  .  desvenlafaxine (PRISTIQ) 100 MG 24 hr tablet, TAKE 1 TABLET(100 MG) BY MOUTH DAILY, Disp: 30 tablet, Rfl: 5 .  gabapentin (NEURONTIN) 400 MG capsule, TAKE 1 CAPSULE(400 MG) BY MOUTH TWICE DAILY, Disp: 180 capsule, Rfl: 3 .  ocrelizumab 600 mg in sodium chloride 0.9 % 500 mL, Inject 600 mg into the vein every 6 (six) months., Disp: , Rfl:  No current facility-administered medications for this visit.  Facility-Administered Medications Ordered in Other Visits:  .  gadopentetate dimeglumine (MAGNEVIST) injection 20 mL, 20 mL, Intravenous, Once PRN, Joannie Medine A, MD .  gadopentetate dimeglumine (MAGNEVIST) injection 20 mL, 20 mL, Intravenous, Once PRN, Shenica Holzheimer, Nanine Means, MD  PAST MEDICAL HISTORY: Past Medical History:  Diagnosis Date  .  Anxiety   . Family history of adverse reaction to anesthesia    Father - difficulty awaken- has sleep apena  . Gait instability   . Head injury, closed, with brief LOC ([place]) 2003   fall  . Incontinence   . 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  Colpo normal with neg ECC  . Movement disorder   . MS (multiple sclerosis) ([place]) 2002  . Seizures ([place]) 2003   none since 2003  . UTI (lower urinary tract infection)   . Vision abnormalities  PAST SURGICAL HISTORY: Past Surgical History:  Procedure Laterality Date  . CERVICAL CONE BIOPSY  2008   CIN 2  . COLONOSCOPY    . PORT-A-CATH REMOVAL N/A 02/16/2016   Procedure: REMOVAL PORT-A-CATH;  Surgeon: Donnie Mesa, MD;  Location: [place];  Service: General;  Laterality: N/A;  . PORTACATH PLACEMENT Left 08/18/14  . PORTACATH PLACEMENT Left 08/18/2014   Procedure: LEFT SUBCLAVIAN VEIN PORT PLACEMENT;  Surgeon: Donnie Mesa, MD;  Location[facility];  Service: General;  Laterality: Left;    FAMILY HISTORY: Family History  Problem Relation Age of Onset  . Thyroid disease Mother   . Heart disease Father   . Mitral valve prolapse Father   . Cancer Father        lung/brain- tumors  . Thyroid disease Sister   . Cancer Paternal Aunt        not sure what kind, maybe cervix  . Thyroid disease Sister   . Breast cancer Neg Hx     SOCIAL HISTORY:  Social History   Socioeconomic History  . Marital status: Single    Spouse name: Not on file  . Number of children: Not on file  . Years of education: Not on file  . Highest education level: Not on file  Occupational History  . Not on file  Tobacco Use  . Smoking status: Never Smoker  . Smokeless tobacco: Never Used  Substance and Sexual Activity  . Alcohol use: No  . Drug use: No  . Sexual activity: Not Currently    Birth control/protection: None    Comment: 1st intercourse- 23, partners- 3,   Other Topics Concern  . Not on  file  Social History Narrative  . Not on file   Social Determinants of Health   Financial Resource Strain:   . Difficulty of Paying Living Expenses:   Food Insecurity:   . Worried About Charity fundraiser in the Last Year:   . Arboriculturist in the Last Year:   Transportation Needs:   . Film/video editor (Medical):   Marland Kitchen Lack of Transportation (Non-Medical):   Physical Activity:   . Days of Exercise per Week:   . Minutes of Exercise per Session:   Stress:   . Feeling of Stress :   Social Connections:   . Frequency of Communication with Friends and Family:   . Frequency of Social Gatherings with Friends and Family:   . Attends Religious Services:   . Active Member of Clubs or Organizations:   . Attends Archivist Meetings:   Marland Kitchen Marital Status:   Intimate Partner Violence:   . Fear of Current or Ex-Partner:   . Emotionally Abused:   Marland Kitchen Physically Abused:   . Sexually Abused:      PHYSICAL EXAM  Vitals:   05/25/19 1050  BP: 100/70  Pulse: 69  Temp: (!) 97.4 F (36.3 C)  Weight: 194 lb 8 oz (88.2 kg)  Height: 5\' 4"  (1.626 m)    Body mass index is 33.39 kg/m.   General: The patient is well-developed and well-nourished and in no acute distress   Neurologic Exam  Mental status: The patient is alert and oriented x 3 at the time of the examination. The patient has apparent normal recent and remote memory,  attention span and concentration ability now seem normal.   Speech is normal.     Cranial nerves: Extraocular movements are full. Facial strength and sensation is normal. No dysarthria is noted.  No obvious hearing deficits are noted.  Motor:  Muscle bulk is normal.   Tone is mildly increased in legs, left greater than right.. Strength is  5/5 in arms and legs now except 4+/5 left EHL.   Sensory: Sensory testing is intact to pinprick, soft touch and vibration sensation in arms and legs now.   Coordination: Cerebellar testing reveals good  finger-nose-finger.   Heels to shin is reduced on the left   Gait and station: She has a normal station.  The gait is slightly wide.  Tandem gait is wide.  Romberg is negative.  Reflexes: Deep tendon reflexes are increased bilaterally in legs.        DIAGNOSTIC DATA (LABS, IMAGING, TESTING) - I reviewed patient records, labs, notes, testing and imaging myself where available.  Lab Results  Component Value Date   WBC 7.0 11/25/2018   HGB 12.5 11/25/2018   HCT 37.3 11/25/2018   MCV 91 11/25/2018   PLT 332 11/25/2018      Component Value Date/Time   NA 143 10/30/2016 0948   K 5.1 10/30/2016 0948   CL 104 10/30/2016 0948   CO2 22 10/30/2016 0948   GLUCOSE 96 10/30/2016 0948   GLUCOSE 86 02/16/2016 0851   BUN 6 10/30/2016 0948   CREATININE 0.67 10/30/2016 0948   CREATININE 0.75 09/02/2012 1057   CALCIUM 9.1 10/30/2016 0948   PROT 6.9 10/30/2016 0948   ALBUMIN 4.5 10/30/2016 0948   AST 20 10/30/2016 0948   ALT 8 10/30/2016 0948   ALKPHOS 82 10/30/2016 0948   BILITOT 0.3 10/30/2016 0948   GFRNONAA 109 10/30/2016 0948   GFRAA 125 10/30/2016 0948   Lab Results  Component Value Date   CHOL 143 10/30/2017   HDL 62 10/30/2017   LDLCALC 68 10/30/2017   TRIG 64 10/30/2017   CHOLHDL 2.3 10/30/2017   No results found for: HGBA1C Lab Results  Component Value Date   VITAMINB12 348 04/04/2015   Lab Results  Component Value Date   TSH 2.871 08/21/2011       ASSESSMENT AND PLAN  Multiple sclerosis ([place]) - Plan: IgG, IgA, IgM, CBC with Differential/Platelet, CD19 and CD20, Flow Cytometry, Stratify JCV Antibody Test ([facility]), Ambulatory referral to Physical Therapy  Ataxic gait  High risk medication use  Left foot drop - Plan: Ambulatory referral to Physical Therapy   1.  She will continue ocrelizumab for now..  The IgM and IgA were low .  We will recheck labs including CD19/CD20.  If the IgM drops further we would need to consider a different disease modifying  therapy.  She was JCV antibody negative in the past so Tysabri might be an option.  I will recheck the JCV antibody today.   2.   Continue vitamin D supplements 3.   Stay active and try to lose weight if possible.   4.   Due to gait issues will need a form for a handicap placard Return in 6 months but call sooner if there are new or worsening neurologic symptoms.   Ambria Mayfield A. Felecia Shelling, MD, PhD, FAAN Certified in Neurology, Clinical Neurophysiology, Sleep Medicine, Pain Medicine and Neuroimaging Director, [facility] [address], [place], [place] [address] [phone]

## 2019-05-28 LAB — CBC WITH DIFFERENTIAL/PLATELET
Basophils Absolute: 0 10*3/uL (ref 0.0–0.2)
Basos: 1 %
EOS (ABSOLUTE): 0.1 10*3/uL (ref 0.0–0.4)
Eos: 2 %
Hematocrit: 37.1 % (ref 34.0–46.6)
Hemoglobin: 12.7 g/dL (ref 11.1–15.9)
Immature Grans (Abs): 0 10*3/uL (ref 0.0–0.1)
Immature Granulocytes: 0 %
Lymphocytes Absolute: 1.1 10*3/uL (ref 0.7–3.1)
Lymphs: 30 %
MCH: 31.7 pg (ref 26.6–33.0)
MCHC: 34.2 g/dL (ref 31.5–35.7)
MCV: 93 fL (ref 79–97)
Monocytes Absolute: 0.4 10*3/uL (ref 0.1–0.9)
Monocytes: 11 %
Neutrophils Absolute: 2 10*3/uL (ref 1.4–7.0)
Neutrophils: 56 %
Platelets: 325 10*3/uL (ref 150–450)
RBC: 4.01 x10E6/uL (ref 3.77–5.28)
RDW: 12.1 % (ref 11.7–15.4)
WBC: 3.7 10*3/uL (ref 3.4–10.8)

## 2019-05-28 LAB — IGG, IGA, IGM
IgA/Immunoglobulin A, Serum: 56 mg/dL — ABNORMAL LOW (ref 87–352)
IgG (Immunoglobin G), Serum: 752 mg/dL (ref 586–1602)
IgM (Immunoglobulin M), Srm: 14 mg/dL — ABNORMAL LOW (ref 26–217)

## 2019-05-28 LAB — CD19 AND CD20, FLOW CYTOMETRY

## 2019-06-02 NOTE — Telephone Encounter (Signed)
JCV ab drawn on 05/25/19 indeterminate, index: 0.24. Inhibition assay: negative

## 2019-06-09 ENCOUNTER — Telehealth: Payer: Self-pay | Admitting: Neurology

## 2019-06-09 NOTE — Telephone Encounter (Signed)
The rest of her lab work has returned.  She is JCV antibody negative.  The IgM was low and the CD19/20 was less than 0.5% (our goal).  I discussed with her that we could continue to do the Ocrevus every 8 months.  She is scheduled for her infusion this week.  I will want to recheck the IgG and IgM a couple weeks before her next infusion.  If it is stable or increases we will continue with every 8 months.  If it does drop further Tysabri would be another option for her and we would make the transition around 8 months from now after we get the next blood work.

## 2019-06-12 ENCOUNTER — Ambulatory Visit: Payer: Medicare Other | Attending: Neurology | Admitting: Rehabilitative and Restorative Service Providers"

## 2019-06-12 ENCOUNTER — Encounter: Payer: Self-pay | Admitting: Rehabilitative and Restorative Service Providers"

## 2019-06-12 ENCOUNTER — Other Ambulatory Visit: Payer: Self-pay

## 2019-06-12 DIAGNOSIS — R2689 Other abnormalities of gait and mobility: Secondary | ICD-10-CM | POA: Diagnosis present

## 2019-06-12 DIAGNOSIS — R2681 Unsteadiness on feet: Secondary | ICD-10-CM | POA: Insufficient documentation

## 2019-06-12 DIAGNOSIS — M21372 Foot drop, left foot: Secondary | ICD-10-CM | POA: Insufficient documentation

## 2019-06-12 DIAGNOSIS — M6281 Muscle weakness (generalized): Secondary | ICD-10-CM | POA: Diagnosis present

## 2019-06-12 DIAGNOSIS — R278 Other lack of coordination: Secondary | ICD-10-CM | POA: Insufficient documentation

## 2019-06-12 NOTE — Therapy (Signed)
[place] [address] [place], [place], [address] Phone: [phone]   Fax:  [phone]  Physical Therapy Evaluation  Patient Details  Name: Ann Oconnor MRN: [id number] Date of Birth: 1976-01-30 Referring Provider (PT): Christell Constant, MD   Encounter Date: 06/12/2019  PT End of Session - 06/12/19 1033    Visit Number  1    Number of Visits  13    Date for PT Re-Evaluation  09/10/19    Authorization Type  [facility] Medicare   10th visit progress note   PT Start Time  0917    PT Stop Time  1002    PT Time Calculation (min)  45 min    Equipment Utilized During Treatment  Gait belt    Activity Tolerance  Patient limited by fatigue    Behavior During Therapy  [facility] for tasks assessed/performed       Past Medical History:  Diagnosis Date  . Anxiety   . Family history of adverse reaction to anesthesia    Father - difficulty awaken- has sleep apena  . Gait instability   . Head injury, closed, with brief LOC ([place]) 2003   fall  . Incontinence   . 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  Colpo normal with neg ECC  . Movement disorder   . MS (multiple sclerosis) ([place]) 2002  . Seizures ([place]) 2003   none since 2003  . UTI (lower urinary tract infection)   . Vision abnormalities     Past Surgical History:  Procedure Laterality Date  . CERVICAL CONE BIOPSY  2008   CIN 2  . COLONOSCOPY    . PORT-A-CATH REMOVAL N/A 02/16/2016   Procedure: REMOVAL PORT-A-CATH;  Surgeon: Donnie Mesa, MD;  Location: [place];  Service: General;  Laterality: N/A;  . PORTACATH PLACEMENT Left 08/18/14  . PORTACATH PLACEMENT Left 08/18/2014   Procedure: LEFT SUBCLAVIAN VEIN PORT PLACEMENT;  Surgeon: Donnie Mesa, MD;  Location: [place];  Service: General;  Laterality: Left;    There were no vitals filed for this visit.   Subjective Assessment - 06/12/19 0921    Subjective  Patient  is presenting to to OPPT neuro for relapsing remitting MS (diagnosed in 2002). Her most recent severe exacerbation was in 2016 (stopped Gilenya and started Ocrevus). Has infusions every 8 months. At recent visit with Dr. Renne Musca (at end of March 2021), reported her balance was feeling more off and struggling with left leg more than right. Most recent MRI from 12/13/18 demonstrated hyperintense foci in the cerebellum, brainstem, thalamus, and hemispheres in a pattern and configuration consistent with chronic demyelinating plaque associated with MS. None of the foci appears to be acute and they do not enhance. Compared to MRI on 11/06/17, there are no new lesions. Patient had her most recent infusion yesterday, so she is doing well today. I feel the most unsteady when I am out grocery shopping, walking to my mom's house (across the field from my house). When I'm tired (after > 1 hour) I feel most unsteady. No falls. No near falls, but feels unsteady during times outlined above.    Pertinent History  dx MS 2002 with greates impairment in left lower extremity (L foot drop) and most recent severe exacerbation in 2016, HLD, hx of seizures (none since 2003)    How long can you walk comfortably?  I become unsteady/fatigued after being active/standing for > 45 min - 1  hour    Patient Stated Goals  Bioness, gain more strength in my left side    Currently in Pain?  No/denies         [facility] PT Assessment - 06/12/19 [id number]      Assessment   Medical Diagnosis  MS    Referring Provider (PT)  Christell Constant, MD    Onset Date/Surgical Date  05/25/19   referral date; MS diagnosis 2002   Prior Therapy  none      Precautions   Precautions  Fall      Balance Screen   Has the patient fallen in the past 6 months  No    Has the patient had a decrease in activity level because of a fear of falling?   No    Is the patient reluctant to leave their home because of a fear of falling?   No      Home Environment   Living  Environment  Private residence    Living Arrangements  Alone    Available Help at Discharge  Other (Comment)   mother lives next door    Type of [place] entrance    Home Layout  Other (Comment)   mobile home - no steps    [place] - single point;Walker - 4 wheels;Shower seat;Grab bars - tub/shower;Other (comment)   elevated toilet seat    Additional Comments  Visits mother's home frequently - has 2 steps to enter home (can hold pole holding overhang); full flight of stairs to get to basement (with R sided railing when ascending) - "short" steps to get to basement.      Prior Function   Level of Independence  Independent    Vocation  Unemployed    Leisure  Enjoys reading, playing games on phone, visiting mother - walks across grass field to get to [employer] home, shopping      Cognition   Overall Cognitive Status  Within Functional Limits for tasks assessed      Sensation   Light Touch  Appears Intact   per gross light touch assessment of BLEs    Hot/Cold  Appears Intact   reports ability to differentiate hot/cold water temps    Proprioception  Appears Intact   per assessment of B hallux      Coordination   Gross Motor Movements are Fluid and Coordinated  No    Finger Nose Finger Test  Mildly dyskinetic on L with trouble finding target     Heel Shin Test  Appears bilaterally within normal limits - more "effortful" when performed with LLE      Posture/Postural Control   Posture/Postural Control  No significant limitations      ROM / Strength   AROM / PROM / Strength  Strength      Strength   Overall Strength  Deficits    Strength Assessment Site  Hip;Knee;Ankle    Right/Left Hip  Right;Left    Right Hip Flexion  4+/5    Right Hip ABduction  4+/5    Left Hip Flexion  4-/5    Left Hip ABduction  4-/5    Right/Left Knee  Right;Left    Right Knee Flexion  5/5    Right Knee Extension  5/5    Left Knee Flexion  3+/5    Left Knee  Extension  3+/5    Right/Left Ankle  Right;Left    Right Ankle Dorsiflexion  5/5    Left Ankle Dorsiflexion  3+/5      Transfers   Transfers  Sit to Stand;Stand to Sit    Sit to Stand  6: Modified independent (Device/Increase time)    Five time sit to stand comments   14.88 seconds with uncontrolled descent on repetitoins 3-5; from standard height chair with BLEs providing stabilization on anterior edge of chair    Stand to Sit  6: Modified independent (Device/Increase time);5: Supervision   S when fatigued    Stand to Sit Details (indicate cue type and reason)  Verbal cues for precautions/safety   when fatigued - to reach for armrests prior to descent   Number of Reps  Other reps (comment)   5 (see 5 time sit to stand comments, above)     Ambulation/Gait   Ambulation/Gait  Yes    Ambulation/Gait Assistance  5: Supervision;4: Min guard;4: Min assist;3: Mod assist    Ambulation/Gait Assistance Details  see FGA detail to see what level of assistance is required during various portions of gait/gait challenges. Overall when reliant on LLE for primary WB, head turn to L, pivot turning to L requires min - mod A    Ambulation Distance (Feet)  100 Feet   127ft x 2; 60 ft x 2    Assistive device  None    Gait Pattern  Decreased arm swing - right;Decreased arm swing - left;Decreased step length - right;Decreased stance time - left;Decreased dorsiflexion - left;Decreased weight shift to left;Trendelenburg;Poor foot clearance - left    Ambulation Surface  Level;Indoor    Gait velocity  2.84 ft/sec   11.55 seconds   Stairs  Yes    Stairs Assistance  5: Supervision;4: Min guard    Stairs Assistance Details (indicate cue type and reason)  cueing for sequencing when descending     Stair Management Technique  Two rails    Number of Stairs  4    Height of Stairs  6    Pre-Gait Activities  performed 5 time sit to stand test and MMT prior to gait assessment and performance of FGA      Standardized  Balance Assessment   Standardized Balance Assessment  --      Functional Gait  Assessment   Gait assessed   Yes    Gait Level Surface  Walks 20 ft in less than 7 sec but greater than 5.5 sec, uses assistive device, slower speed, mild gait deviations, or deviates 6-10 in outside of the 12 in walkway width.    Change in Gait Speed  Able to change speed, demonstrates mild gait deviations, deviates 6-10 in outside of the 12 in walkway width, or no gait deviations, unable to achieve a major change in velocity, or uses a change in velocity, or uses an assistive device.    Gait with Horizontal Head Turns  Performs head turns with moderate changes in gait velocity, slows down, deviates 10-15 in outside 12 in walkway width but recovers, can continue to walk.   requires min - mod A with L head turn   Gait with Vertical Head Turns  Performs task with slight change in gait velocity (eg, minor disruption to smooth gait path), deviates 6 - 10 in outside 12 in walkway width or uses assistive device    Gait and Pivot Turn  Turns slowly, requires verbal cueing, or requires several small steps to catch balance following turn and stop   when turning to L requires min A to  prevent LOB   Step Over Obstacle  Is able to step over one shoe box (4.5 in total height) but must slow down and adjust steps to clear box safely. May require verbal cueing.    Gait with Narrow Base of Support  Ambulates less than 4 steps heel to toe or cannot perform without assistance.    Gait with Eyes Closed  Walks 20 ft, slow speed, abnormal gait pattern, evidence for imbalance, deviates 10-15 in outside 12 in walkway width. Requires more than 9 sec to ambulate 20 ft.    Ambulating Backwards  Walks 20 ft, slow speed, abnormal gait pattern, evidence for imbalance, deviates 10-15 in outside 12 in walkway width.    Steps  Two feet to a stair, must use rail.   reciprocal gait ascending, step to descending; B railings   Total Score  12    FGA  comment:  no AD used; scores <19/30 = high fall risk                Objective measurements completed on examination: See above findings.              PT Education - 06/12/19 1031    Education Details  findings from today's initial evaluation, POC, role of PT    Person(s) Educated  Patient    Methods  Explanation    Comprehension  Verbalized understanding;Need further instruction       PT Short Term Goals - 06/12/19 1049      PT SHORT TERM GOAL #1   Title  Patient will be independent with HEP to decrease fall risk and improve functional mobility. (ALL STGs DUE 07/12/19)    Time  4    Period  Weeks    Status  New    Target Date  07/12/19      PT SHORT TERM GOAL #2   Title  6 Minute Walk Test will be performed and LTG will be set accordingly to demonstrate improvement in endurance.    Time  4    Period  Weeks    Status  New      PT SHORT TERM GOAL #3   Title  Patient will perform 5 Time Sit to Stand Test in </= 12 seconds to demonstrate improvement in functional mobility and decrease in fall risk.    Baseline  06/12/19: 14.88 seconds    Time  4    Period  Weeks    Status  New      PT SHORT TERM GOAL #4   Title  Patient will score >/= 15/30 on the functional gait assessment to demonstrate a decrease in fall risk.    Baseline  06/12/19: FGA =12/30    Time  4    Period  Weeks    Status  New      PT SHORT TERM GOAL #5   Title  Bioness trial will be performed with patient and education provided to patient concerning findings and efficacy of use of this system for patient.    Time  4    Period  Weeks    Status  New        PT Long Term Goals - 06/12/19 1058      PT LONG TERM GOAL #1   Title  Patient will be independent with HEP to decrease fall risk and improve functional mobility. (ALL LTGs due 09/04/19)    Time  12    Period  Weeks    Status  New    Target Date  09/04/19      PT LONG TERM GOAL #2   Title  6 Minute Walk Test goal to be set if indicated  once baseline assessment performed.    Time  12    Period  Weeks    Status  New      PT LONG TERM GOAL #3   Title  Patient will score </= 9 seconds on the 5 time sit to stand test in order to demonstrate improvement in functional strength and decreased fall risk.    Time  12    Period  Weeks    Status  New      PT LONG TERM GOAL #4   Title  Patient will score >/= 21/30 on the Functional Gait Assessment to demonstrate a decrease in fall risk.    Time  12    Period  Weeks    Status  New      PT LONG TERM GOAL #5   Title  Patient will demonstrate ability to ambulate >/= 500 feet mod I with LRAD safely including indoor and outdoor surfaces and inclines to demonstrate safety with community based ambulation and ambulation to/from mother's home.    Time  12    Period  Weeks    Status  New      Additional Long Term Goals   Additional Long Term Goals  Yes      PT LONG TERM GOAL #6   Title  Patient will demonstrate ability to climb and descend 4 steps (3 reps - 12 steps total) with mod I and use of one railing to demonstrate safety when navigating stairs when at mother's home.    Time  12    Period  Weeks    Status  New             Plan - 06/12/19 1044    Clinical Impression Statement  Patient is a 44 year old female referred to Neuro OPPT for evaluation with primary concern of relapsing remitting MS with greatest functional impairment/impact to LLE and L foot drop. Most recent exacerbation was in 2016 when changing medications, and most recent infusion was yesterday.  Pt's PMH is significant for: dx MS 2002 with greates impairment in left lower extremity (L foot drop) and most recent severe exacerbation in 2016, HLD, hx of seizures (none since 2003). The following deficits were present during the exam: abnormality of gait, impaired balance, impaired muscular strength, and impaired coordination. Pt's 5 time sit to stand score of 14.88 seconds and functional gait assessment score of  12/30  indicate pt is at a high risk for falls. Pt would benefit from skilled PT to address these impairments and functional limitations to maximize functional mobility independence and decrease fall risk.    Personal Factors and Comorbidities  Time since onset of injury/illness/exacerbation;Social Background    Examination-Activity Limitations  Lift;Squat;Locomotion Level;Stairs;Stand    Examination-Participation Restrictions  Yard Work;Cleaning;Laundry;Community Activity;Shop    Stability/Clinical Decision Making  Evolving/Moderate complexity    Clinical Decision Making  Moderate    Rehab Potential  Good    PT Frequency  1x / week   PT recommends 2x/wk for 10 weeks, but patient requests 1x/wk due to busy schedule (moving soon) - if able to increase to recommended frequency will resubmit POC   PT Duration  12 weeks    PT Treatment/Interventions  ADLs/Self Care Home Management;Electrical Stimulation;DME Instruction;Gait training;Therapeutic exercise;Therapeutic activities;Functional mobility training;Stair training;Balance training;Neuromuscular re-education;Patient/family  education;Manual techniques;Orthotic Fit/Training;Passive range of motion;Energy conservation;Aquatic Therapy    PT Next Visit Plan  initiate HEP for functional strengthening; trial of Bioness unit for L foot drop; 6 minute walk test to establish goal baseline when able    Consulted and Agree with Plan of Care  Patient       Patient will benefit from skilled therapeutic intervention in order to improve the following deficits and impairments:  Abnormal gait, Decreased balance, Decreased endurance, Decreased mobility, Difficulty walking, Decreased knowledge of precautions, Decreased range of motion, Impaired perceived functional ability, Improper body mechanics, Decreased activity tolerance, Decreased coordination, Decreased knowledge of use of DME, Decreased safety awareness, Decreased strength, Impaired flexibility  Visit  Diagnosis: Muscle weakness (generalized)  Foot drop, left  Unsteadiness on feet  Other abnormalities of gait and mobility  Other lack of coordination   Problem List Patient Active Problem List   Diagnosis Date Noted  . Left foot drop 05/25/2019  . Frequent UTI 11/25/2018  . Hyperlipidemia 10/29/2016  . Vitamin D deficiency 10/29/2016  . Numbness 04/19/2016  . Neck pain 10/18/2015  . Occipital neuralgia of left side 10/18/2015  . Polyneuropathy 04/04/2015  . Ataxic gait 12/24/2014  . Left carpal tunnel syndrome 09/27/2014  . Encounter for chemotherapy management 07/06/2014  . High risk medication use 05/14/2014  . Urinary incontinence 05/14/2014  . Depression with anxiety 05/06/2014  . Lower extremity weakness 04/28/2014  . Numbness of both lower extremities 04/28/2014  . Multiple falls 04/28/2014  . Nausea & vomiting 04/28/2014  . Relapsing remitting multiple sclerosis ([place]) 07/04/2012  . Memory loss 12/03/2011  . Multiple sclerosis ([place]) 09/14/2008  . Constipation 09/14/2008  . RUQ PAIN 09/14/2008   Juliann Pulse, PT, DPT  06/12/2019, 11:58 AM  [facility] [address] [place], [place], [address] Phone: [phone]   Fax:  [phone]  Name: Ann Oconnor MRN: [id number] Date of Birth: [birth year]

## 2019-06-19 ENCOUNTER — Other Ambulatory Visit: Payer: Self-pay

## 2019-06-19 ENCOUNTER — Ambulatory Visit: Payer: Medicare Other | Admitting: Physical Therapy

## 2019-06-19 ENCOUNTER — Encounter: Payer: Self-pay | Admitting: Physical Therapy

## 2019-06-19 DIAGNOSIS — R2689 Other abnormalities of gait and mobility: Secondary | ICD-10-CM

## 2019-06-19 DIAGNOSIS — M21372 Foot drop, left foot: Secondary | ICD-10-CM

## 2019-06-19 DIAGNOSIS — M6281 Muscle weakness (generalized): Secondary | ICD-10-CM | POA: Diagnosis not present

## 2019-06-19 DIAGNOSIS — R2681 Unsteadiness on feet: Secondary | ICD-10-CM

## 2019-06-19 NOTE — Patient Instructions (Signed)
Access Code: [facility] URL: [email or web address] Date: 06/19/2019 Prepared by: Willow Ora  Exercises Long Sitting Ankle Plantar Flexion with Resistance - 1 x daily - 5 x weekly - 1 sets - 10 reps Single Leg Heel Raise with Counter Support - 1 x daily - 5 x weekly - 1 sets - 10 reps Tandem Walking with Counter Support - 1 x daily - 5 x weekly - 1 sets - 3 reps Toe Walking with Counter Support - 1 x daily - 5 x weekly - 1 sets - 3 reps

## 2019-06-19 NOTE — Therapy (Signed)
[facility] [address] [place], [place], [address] Phone: [phone]   Fax:  [phone]  Physical Therapy Treatment  Patient Details  Name: Ann Oconnor MRN: [id number] Date of Birth: [birth year] Referring Provider (PT): Christell Constant, MD   Encounter Date: 06/19/2019  PT End of Session - 06/19/19 1020    Visit Number  2    Number of Visits  13    Date for PT Re-Evaluation  09/10/19    Authorization Type  [employer] Medicare   10th visit progress note   PT Start Time  1017    PT Stop Time  1058    PT Time Calculation (min)  41 min    Equipment Utilized During Treatment  Gait belt    Activity Tolerance  Patient tolerated treatment well    Behavior During Therapy  WFL for tasks assessed/performed       Past Medical History:  Diagnosis Date  . Anxiety   . Family history of adverse reaction to anesthesia    Father - difficulty awaken- has sleep apena  . Gait instability   . Head injury, closed, with brief LOC ([place]) 2003   fall  . Incontinence   . 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  Colpo normal with neg ECC  . Movement disorder   . MS (multiple sclerosis) ([place]) 2002  . Seizures ([place]) 2003   none since 2003  . UTI (lower urinary tract infection)   . Vision abnormalities     Past Surgical History:  Procedure Laterality Date  . CERVICAL CONE BIOPSY  2008   CIN 2  . COLONOSCOPY    . PORT-A-CATH REMOVAL N/A 02/16/2016   Procedure: REMOVAL PORT-A-CATH;  Surgeon: Donnie Mesa, MD;  Location: [facility];  Service: General;  Laterality: N/A;  . PORTACATH PLACEMENT Left 08/18/14  . PORTACATH PLACEMENT Left 08/18/2014   Procedure: LEFT SUBCLAVIAN VEIN PORT PLACEMENT;  Surgeon: Donnie Mesa, MD;  Location: [facility];  Service: General;  Laterality: Left;    There were no vitals filed for this visit.  Subjective Assessment - 06/19/19 1019    Subjective  No  new complaitns. No falls or pain to report.    Pertinent History  dx MS 2002 with greates impairment in left lower extremity (L foot drop) and most recent severe exacerbation in 2016, HLD, hx of seizures (none since 2003)    How long can you walk comfortably?  I become unsteady/fatigued after being active/standing for > 45 min - 1 hour    Patient Stated Goals  Bioness, gain more strength in my left side    Currently in Pain?  No/denies         [facility] PT Assessment - 06/19/19 1023      6 Minute Walk- Baseline   6 Minute Walk- Baseline  yes    BP (mmHg)  108/76    HR (bpm)  85    02 Sat (%RA)  99 %    Modified Borg Scale for Dyspnea  0- Nothing at all    Perceived Rate of Exertion (Borg)  6-      6 Minute walk- Post Test   6 Minute Walk Post Test  yes    BP (mmHg)  119/69    HR (bpm)  86    02 Sat (%RA)  99 %    Modified Borg Scale for Dyspnea  0- Nothing at all  Perceived Rate of Exertion (Borg)  6-      6 minute walk test results    Aerobic Endurance Distance Walked  1221    Endurance additional comments  no AD, no rest breaks with supervision assist only           [facility] Adult PT Treatment/Exercise - 06/19/19 1037      Transfers   Transfers  Sit to Stand;Stand to Sit    Sit to Stand  6: Modified independent (Device/Increase time)    Stand to Sit  6: Modified independent (Device/Increase time);5: Supervision      Ambulation/Gait   Ambulation/Gait  Yes    Ambulation/Gait Assistance  5: Supervision    Ambulation/Gait Assistance Details  around gym with session.     Assistive device  None    Gait Pattern  Decreased arm swing - right;Decreased arm swing - left;Decreased step length - right;Decreased stance time - left;Decreased dorsiflexion - left;Decreased weight shift to left;Trendelenburg;Poor foot clearance - left    Ambulation Surface  Level;Indoor      Exercises   Exercises  Other Exercises    Other Exercises   educated on and issued ex's to address  strengthening and balance. refer to [facility] program for full details. cues on correct ex form/technique needed. min guard assist for balance ex's.         PT Education - 06/19/19 1056    Education Details  results of 6 minute walk test, initial HEP for strengthening/balance, process for setup of Bioness and if it works/she likes it process for working on getting home unit    [employer]) Educated  Patient    Methods  Explanation;Demonstration;Verbal cues;Handout    Comprehension  Verbalized understanding;Returned demonstration;Verbal cues required;Need further instruction       PT Short Term Goals - 06/12/19 1049      PT SHORT TERM GOAL #1   Title  Patient will be independent with HEP to decrease fall risk and improve functional mobility. (ALL STGs DUE 07/12/19)    Time  4    Period  Weeks    Status  New    Target Date  07/12/19      PT SHORT TERM GOAL #2   Title  6 Minute Walk Test will be performed and LTG will be set accordingly to demonstrate improvement in endurance.    Time  4    Period  Weeks    Status  New      PT SHORT TERM GOAL #3   Title  Patient will perform 5 Time Sit to Stand Test in </= 12 seconds to demonstrate improvement in functional mobility and decrease in fall risk.    Baseline  06/12/19: 14.88 seconds    Time  4    Period  Weeks    Status  New      PT SHORT TERM GOAL #4   Title  Patient will score >/= 15/30 on the functional gait assessment to demonstrate a decrease in fall risk.    Baseline  06/12/19: FGA =12/30    Time  4    Period  Weeks    Status  New      PT SHORT TERM GOAL #5   Title  Bioness trial will be performed with patient and education provided to patient concerning findings and efficacy of use of this system for patient.    Time  4    Period  Weeks    Status  New  PT Long Term Goals - 06/12/19 1058      PT LONG TERM GOAL #1   Title  Patient will be independent with HEP to decrease fall risk and improve functional mobility.  (ALL LTGs due 09/04/19)    Time  12    Period  Weeks    Status  New    Target Date  09/04/19      PT LONG TERM GOAL #2   Title  6 Minute Walk Test goal to be set if indicated once baseline assessment performed.    Time  12    Period  Weeks    Status  New      PT LONG TERM GOAL #3   Title  Patient will score </= 9 seconds on the 5 time sit to stand test in order to demonstrate improvement in functional strength and decreased fall risk.    Time  12    Period  Weeks    Status  New      PT LONG TERM GOAL #4   Title  Patient will score >/= 21/30 on the Functional Gait Assessment to demonstrate a decrease in fall risk.    Time  12    Period  Weeks    Status  New      PT LONG TERM GOAL #5   Title  Patient will demonstrate ability to ambulate >/= 500 feet mod I with LRAD safely including indoor and outdoor surfaces and inclines to demonstrate safety with community based ambulation and ambulation to/from mother's home.    Time  12    Period  Weeks    Status  New      Additional Long Term Goals   Additional Long Term Goals  Yes      PT LONG TERM GOAL #6   Title  Patient will demonstrate ability to climb and descend 4 steps (3 reps - 12 steps total) with mod I and use of one railing to demonstrate safety when navigating stairs when at mother's home.    Time  12    Period  Weeks    Status  New            Plan - 06/19/19 1021    Clinical Impression Statement  Today's skilled session focused on establishing baseline for 6 minute walk test initailly. Remainder of session focused on estabishment of an HEP to address LE strengthening/balance. No issues reported with performance in session today. The pt is progressing toward goals and should benefit from continued PT to progress toward unmet goals.    Personal Factors and Comorbidities  Time since onset of injury/illness/exacerbation;Social Background    Examination-Activity Limitations  Lift;Squat;Locomotion Level;Stairs;Stand     Examination-Participation Restrictions  Yard Work;Cleaning;Laundry;Community Activity;Shop    Stability/Clinical Decision Making  Evolving/Moderate complexity    Rehab Potential  Good    PT Frequency  1x / week   PT recommends 2x/wk for 10 weeks, but patient requests 1x/wk due to busy schedule (moving soon) - if able to increase to recommended frequency will resubmit POC   PT Duration  12 weeks    PT Treatment/Interventions  ADLs/Self Care Home Management;Electrical Stimulation;DME Instruction;Gait training;Therapeutic exercise;Therapeutic activities;Functional mobility training;Stair training;Balance training;Neuromuscular re-education;Patient/family education;Manual techniques;Orthotic Fit/Training;Passive range of motion;Energy conservation;Aquatic Therapy    PT Next Visit Plan  trial of Bioness unit for L foot drop, also try foot up brace; continue to work on activity tolerance, LE strengthening and balance    Consulted and Agree with Plan of  Care  Patient       Patient will benefit from skilled therapeutic intervention in order to improve the following deficits and impairments:  Abnormal gait, Decreased balance, Decreased endurance, Decreased mobility, Difficulty walking, Decreased knowledge of precautions, Decreased range of motion, Impaired perceived functional ability, Improper body mechanics, Decreased activity tolerance, Decreased coordination, Decreased knowledge of use of DME, Decreased safety awareness, Decreased strength, Impaired flexibility  Visit Diagnosis: Muscle weakness (generalized)  Foot drop, left  Unsteadiness on feet  Other abnormalities of gait and mobility     Problem List Patient Active Problem List   Diagnosis Date Noted  . Left foot drop 05/25/2019  . Frequent UTI 11/25/2018  . Hyperlipidemia 10/29/2016  . Vitamin D deficiency 10/29/2016  . Numbness 04/19/2016  . Neck pain 10/18/2015  . Occipital neuralgia of left side 10/18/2015  . Polyneuropathy  04/04/2015  . Ataxic gait 12/24/2014  . Left carpal tunnel syndrome 09/27/2014  . Encounter for chemotherapy management 07/06/2014  . High risk medication use 05/14/2014  . Urinary incontinence 05/14/2014  . Depression with anxiety 05/06/2014  . Lower extremity weakness 04/28/2014  . Numbness of both lower extremities 04/28/2014  . Multiple falls 04/28/2014  . Nausea & vomiting 04/28/2014  . Relapsing remitting multiple sclerosis ([place]) 07/04/2012  . Memory loss 12/03/2011  . Multiple sclerosis ([facility]) 09/14/2008  . Constipation 09/14/2008  . RUQ PAIN 09/14/2008    Willow Ora, PTA, [facility] [address], [place], [place] [address] [phone] 06/19/19, 1:00 PM   Name: Ann Oconnor MRN: [id number] Date of Birth: Oct 24, 1975

## 2019-06-25 ENCOUNTER — Ambulatory Visit: Payer: Medicare Other | Admitting: Physical Therapy

## 2019-06-25 ENCOUNTER — Other Ambulatory Visit: Payer: Self-pay

## 2019-06-25 ENCOUNTER — Encounter: Payer: Self-pay | Admitting: Physical Therapy

## 2019-06-25 DIAGNOSIS — M6281 Muscle weakness (generalized): Secondary | ICD-10-CM | POA: Diagnosis not present

## 2019-06-25 DIAGNOSIS — R2689 Other abnormalities of gait and mobility: Secondary | ICD-10-CM

## 2019-06-25 DIAGNOSIS — M21372 Foot drop, left foot: Secondary | ICD-10-CM

## 2019-06-25 DIAGNOSIS — R2681 Unsteadiness on feet: Secondary | ICD-10-CM

## 2019-06-26 NOTE — Therapy (Signed)
[facility] [address] [place], [place], [address] Phone: [phone]   Fax:  [phone]  Physical Therapy Treatment  Patient Details  Name: Ann Oconnor MRN: [id number] Date of Birth: 1975-11-02 Referring Provider (PT): Christell Constant, MD   Encounter Date: 06/25/2019  PT End of Session - 06/25/19 1019    Visit Number  3    Number of Visits  13    Date for PT Re-Evaluation  09/10/19    Authorization Type  [employer]HC Medicare   10th visit progress note   PT Start Time  1017    PT Stop Time  1100    PT Time Calculation (min)  43 min    Equipment Utilized During Treatment  Gait belt;Other (comment)   foot up brace; Bioness   Activity Tolerance  Patient tolerated treatment well    Behavior During Therapy  WFL for tasks assessed/performed       Past Medical History:  Diagnosis Date  . Anxiety   . Family history of adverse reaction to anesthesia    Father - difficulty awaken- has sleep apena  . Gait instability   . Head injury, closed, with brief LOC ([facility]) 2003   fall  . Incontinence   . 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  Colpo normal with neg ECC  . Movement disorder   . MS (multiple sclerosis) ([facility]) 2002  . Seizures ([facility]) 2003   none since 2003  . UTI (lower urinary tract infection)   . Vision abnormalities     Past Surgical History:  Procedure Laterality Date  . CERVICAL CONE BIOPSY  2008   CIN 2  . COLONOSCOPY    . PORT-A-CATH REMOVAL N/A 02/16/2016   Procedure: REMOVAL PORT-A-CATH;  Surgeon: Donnie Mesa, MD;  Location: [facility];  Service: General;  Laterality: N/A;  . PORTACATH PLACEMENT Left 08/18/14  . PORTACATH PLACEMENT Left 08/18/2014   Procedure: LEFT SUBCLAVIAN VEIN PORT PLACEMENT;  Surgeon: Donnie Mesa, MD;  Location: [facility];  Service: General;  Laterality: Left;    There were no vitals filed for this visit.  Subjective  Assessment - 06/25/19 1018    Subjective  No new complaints. No falls or pain to report.    Pertinent History  dx MS 2002 with greates impairment in left lower extremity (L foot drop) and most recent severe exacerbation in 2016, HLD, hx of seizures (none since 2003)    How long can you walk comfortably?  I become unsteady/fatigued after being active/standing for > 45 min - 1 hour    Patient Stated Goals  Bioness, gain more strength in my left side    Currently in Pain?  No/denies            [facility] Adult PT Treatment/Exercise - 06/25/19 1019      Transfers   Transfers  Sit to Stand;Stand to Sit    Sit to Stand  6: Modified independent (Device/Increase time)    Stand to Sit  6: Modified independent (Device/Increase time);5: Supervision      Ambulation/Gait   Ambulation/Gait  Yes    Ambulation/Gait Assistance  5: Supervision    Ambulation/Gait Assistance Details  use of foot up brace to left LE with gait on indoor and outdoor surfaces. Pt reported feeling it made it easier to lift her foot. Last gait rep was with Bioness on left LE with pt reporting not feeling it was helping to lift  her foot.     Ambulation Distance (Feet)  345 Feet   x1, 500 x1, 230 x1   Assistive device  None    Gait Pattern  Decreased arm swing - right;Decreased arm swing - left;Decreased step length - right;Decreased stance time - left;Decreased dorsiflexion - left;Decreased weight shift to left;Trendelenburg;Poor foot clearance - left    Ambulation Surface  Level;Indoor      Modalities   Modalities  Teacher, English as a foreign language Location  left Careers adviser  for increased muscle activation/strengthening    Electrical Stimulation Parameters  refer to tablet 1 for adjusted parameters. started with quick fit electrodes. pt unable to tolerate enough intensity to elicit a response. Attempted to adjust waveforms, PPS without any success for  increased tolerance or muscle activation. With remaining time switched to small round cloth electrodes to see if pt was able to tolerate enough intensity to get a response. This was not successful and returned to use of quickfit. Took intensity as high as pt could tolerate and then worked on gait indoors.     Electrical Stimulation Goals  Strength;Neuromuscular facilitation           PT Short Term Goals - 06/19/19 1423      PT SHORT TERM GOAL #1   Title  Patient will be independent with HEP to decrease fall risk and improve functional mobility. (ALL STGs DUE 07/12/19)    Time  4    Period  Weeks    Status  New    Target Date  07/12/19      PT SHORT TERM GOAL #2   Title  6 Minute Walk Test will be performed and LTG will be set accordingly to demonstrate improvement in endurance.    Baseline  06/19/19: 1221 feet    Time  4    Period  Weeks    Status  Achieved      PT SHORT TERM GOAL #3   Title  Patient will perform 5 Time Sit to Stand Test in </= 12 seconds to demonstrate improvement in functional mobility and decrease in fall risk.    Baseline  06/12/19: 14.88 seconds    Time  4    Period  Weeks    Status  New      PT SHORT TERM GOAL #4   Title  Patient will score >/= 15/30 on the functional gait assessment to demonstrate a decrease in fall risk.    Baseline  06/12/19: FGA =12/30    Time  4    Period  Weeks    Status  New      PT SHORT TERM GOAL #5   Title  Bioness trial will be performed with patient and education provided to patient concerning findings and efficacy of use of this system for patient.    Time  4    Period  Weeks    Status  New        PT Long Term Goals - 06/19/19 1522      PT LONG TERM GOAL #1   Title  Patient will be independent with HEP to decrease fall risk and improve functional mobility. (ALL LTGs due 09/04/19)    Time  12    Period  Weeks    Status  New      PT LONG TERM GOAL #2   Title  Patient will improve 6 Minute Walk Test score  by >/= 250  feet to demonstrate improvement in her endurance.    Time  12    Period  Weeks    Status  New      PT LONG TERM GOAL #3   Title  Patient will score </= 9 seconds on the 5 time sit to stand test in order to demonstrate improvement in functional strength and decreased fall risk.    Time  12    Period  Weeks    Status  New      PT LONG TERM GOAL #4   Title  Patient will score >/= 21/30 on the Functional Gait Assessment to demonstrate a decrease in fall risk.    Time  12    Period  Weeks    Status  New      PT LONG TERM GOAL #5   Title  Patient will demonstrate ability to ambulate >/= 500 feet mod I with LRAD safely including indoor and outdoor surfaces and inclines to demonstrate safety with community based ambulation and ambulation to/from mother's home.    Time  12    Period  Weeks    Status  New      PT LONG TERM GOAL #6   Title  Patient will demonstrate ability to climb and descend 4 steps (3 reps - 12 steps total) with mod I and use of one railing to demonstrate safety when navigating stairs when at mother's home.    Time  12    Period  Weeks    Status  New          Plan - 06/25/19 1019    Clinical Impression Statement  Today's skilled session initially focused on trial of Foot up brace with gait on indoor/outdoor surfaces. Pt reported liking the assitance it gave for foot clearance. Pt provided with ordering information of [employer] and made aware that some medical supply stores carry them as well. Remainder of session focused on set up of Bioness to left anterior tib with no reponse elicited. This could be due to pt was unable to tolerate higher stim levels and was not getting enough to fully stimulate the muscle/nerve. Will plan to try this at one more session if pt is still interested in trying Bioness.    Personal Factors and Comorbidities  Time since onset of injury/illness/exacerbation;Social Background    Examination-Activity Limitations  Lift;Squat;Locomotion  Level;Stairs;Stand    Examination-Participation Restrictions  Yard Work;Cleaning;Laundry;Community Activity;Shop    Stability/Clinical Decision Making  Evolving/Moderate complexity    Rehab Potential  Good    PT Frequency  1x / week   PT recommends 2x/wk for 10 weeks, but patient requests 1x/wk due to busy schedule (moving soon) - if able to increase to recommended frequency will resubmit POC   PT Duration  12 weeks    PT Treatment/Interventions  ADLs/Self Care Home Management;Electrical Stimulation;DME Instruction;Gait training;Therapeutic exercise;Therapeutic activities;Functional mobility training;Stair training;Balance training;Neuromuscular re-education;Patient/family education;Manual techniques;Orthotic Fit/Training;Passive range of motion;Energy conservation;Aquatic Therapy    PT Next Visit Plan  did she order/get a foot up brace; continue to work on activity tolerance, LE strengthening and balance    Consulted and Agree with Plan of Care  Patient       Patient will benefit from skilled therapeutic intervention in order to improve the following deficits and impairments:  Abnormal gait, Decreased balance, Decreased endurance, Decreased mobility, Difficulty walking, Decreased knowledge of precautions, Decreased range of motion, Impaired perceived functional ability, Improper body mechanics, Decreased activity tolerance, Decreased coordination,  Decreased knowledge of use of DME, Decreased safety awareness, Decreased strength, Impaired flexibility  Visit Diagnosis: Muscle weakness (generalized)  Foot drop, left  Unsteadiness on feet  Other abnormalities of gait and mobility     Problem List Patient Active Problem List   Diagnosis Date Noted  . Left foot drop 05/25/2019  . Frequent UTI 11/25/2018  . Hyperlipidemia 10/29/2016  . Vitamin D deficiency 10/29/2016  . Numbness 04/19/2016  . Neck pain 10/18/2015  . Occipital neuralgia of left side 10/18/2015  . Polyneuropathy  04/04/2015  . Ataxic gait 12/24/2014  . Left carpal tunnel syndrome 09/27/2014  . Encounter for chemotherapy management 07/06/2014  . High risk medication use 05/14/2014  . Urinary incontinence 05/14/2014  . Depression with anxiety 05/06/2014  . Lower extremity weakness 04/28/2014  . Numbness of both lower extremities 04/28/2014  . Multiple falls 04/28/2014  . Nausea & vomiting 04/28/2014  . Relapsing remitting multiple sclerosis ([facility]) 07/04/2012  . Memory loss 12/03/2011  . Multiple sclerosis ([place]) 09/14/2008  . Constipation 09/14/2008  . RUQ PAIN 09/14/2008   Willow Ora, PTA, [facility] [address], [place], [place] [address] [phone] 06/26/19, 2:27 PM   Name: CLARIE HEDRICH MRN: [id number] Date of Birth: 1975-04-10

## 2019-07-03 ENCOUNTER — Ambulatory Visit: Payer: Medicare Other | Admitting: Rehabilitative and Restorative Service Providers"

## 2019-07-03 ENCOUNTER — Other Ambulatory Visit: Payer: Self-pay

## 2019-07-03 ENCOUNTER — Encounter: Payer: Self-pay | Admitting: Rehabilitative and Restorative Service Providers"

## 2019-07-03 DIAGNOSIS — M6281 Muscle weakness (generalized): Secondary | ICD-10-CM | POA: Diagnosis not present

## 2019-07-03 DIAGNOSIS — R2689 Other abnormalities of gait and mobility: Secondary | ICD-10-CM

## 2019-07-03 DIAGNOSIS — M21372 Foot drop, left foot: Secondary | ICD-10-CM

## 2019-07-03 DIAGNOSIS — R2681 Unsteadiness on feet: Secondary | ICD-10-CM

## 2019-07-03 NOTE — Patient Instructions (Signed)
Corner Balance  1. Stand in the corner with the back of a chair immediately in front of you.  2. Feet together and keep your eyes open  3. Turn your head side to side for 30 seconds. Take a break. Then repeat.  Perform this exercise for head nodding and diagonals in both directions Perform this exercise 1x/day every day.

## 2019-07-03 NOTE — Therapy (Signed)
[facility] [address] [place], [place], [address] Phone: [phone]   Fax:  [phone]  Physical Therapy Treatment  Patient Details  Name: Ann Oconnor MRN: [id number] Date of Birth: April 03, 1975 Referring Provider (PT): Christell Constant, MD   Encounter Date: 07/03/2019  PT End of Session - 07/03/19 1212    Visit Number  4    Number of Visits  13    Date for PT Re-Evaluation  09/10/19    Authorization Type  [employer] Medicare   10th visit progress note   PT Start Time  1015    PT Stop Time  1058    PT Time Calculation (min)  43 min    Equipment Utilized During Treatment  Gait belt;Other (comment)   foot up brace; Bioness   Activity Tolerance  Patient tolerated treatment well    Behavior During Therapy  WFL for tasks assessed/performed       Past Medical History:  Diagnosis Date  . Anxiety   . Family history of adverse reaction to anesthesia    Father - difficulty awaken- has sleep apena  . Gait instability   . Head injury, closed, with brief LOC ([place]) 2003   fall  . Incontinence   . 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  Colpo normal with neg ECC  . Movement disorder   . MS (multiple sclerosis) ([place]) 2002  . Seizures ([place]) 2003   none since 2003  . UTI (lower urinary tract infection)   . Vision abnormalities     Past Surgical History:  Procedure Laterality Date  . CERVICAL CONE BIOPSY  2008   CIN 2  . COLONOSCOPY    . PORT-A-CATH REMOVAL N/A 02/16/2016   Procedure: REMOVAL PORT-A-CATH;  Surgeon: Donnie Mesa, MD;  Location: [facility];  Service: General;  Laterality: N/A;  . PORTACATH PLACEMENT Left 08/18/14  . PORTACATH PLACEMENT Left 08/18/2014   Procedure: LEFT SUBCLAVIAN VEIN PORT PLACEMENT;  Surgeon: Donnie Mesa, MD;  Location: [facility];  Service: General;  Laterality: Left;    There were no vitals filed for this visit.  Subjective  Assessment - 07/03/19 1015    Subjective  Patient got the foot up brace off [employer]. Has been wearing it for about one week. No falls. Compliant with HEP with foot up brace donned.    Pertinent History  dx MS 2002 with greates impairment in left lower extremity (L foot drop) and most recent severe exacerbation in 2016, HLD, hx of seizures (none since 2003)    How long can you walk comfortably?  I become unsteady/fatigued after being active/standing for > 45 min - 1 hour    Patient Stated Goals  Bioness, gain more strength in my left side    Currently in Pain?  No/denies                       [facility] Adult PT Treatment/Exercise - 07/03/19 1019      Transfers   Transfers  Sit to Stand;Stand to Sit    Sit to Stand  6: Modified independent (Device/Increase time)    Stand to Sit  6: Modified independent (Device/Increase time);5: Supervision      Ambulation/Gait   Ambulation/Gait  Yes    Ambulation/Gait Assistance  5: Supervision    Ambulation/Gait Assistance Details  multiple surfaces (see NMR note, too) with foot up brace with notable improvement in L foot clearance.  Gait on grass, paved, inclined (grassy and paved), and mulch. Requiring close S - min guard throughout with intermittent min A when fatigued     Ambulation Distance (Feet)  500 Feet   500 x 1, 115 x 3, multiple short walks in/around gym   Assistive device  None    Gait Pattern  Decreased arm swing - right;Decreased arm swing - left;Decreased step length - right;Decreased stance time - left;Decreased dorsiflexion - left;Decreased weight shift to left;Trendelenburg;Poor foot clearance - left    Ambulation Surface  Level;Unlevel;Indoor;Outdoor;Paved;Grass;Other (comment)   mulch     Neuro Re-ed    Neuro Re-ed Details   On grass and mulch fwd/back, side stepping each direction, marching fwd/back Requires min A with marching fwd/back with cues for L foot clearance (increase L hip flex) especially when fatigued. Corner  balance exercises: FT, EO, head turn/nod/diagonal requiring frequent hip/hand taps to wall/chair back but able to recover balance rapidly with tap. Single limb stance with close S and intermittent hand taps at counter x 10 seconds, 3 reps bilaterally (more challenging to perform on left side). At counter: partial tandem progressed to tandem stance with L foot posterior x 30 second holds, 3 reps.       Modalities   Modalities  --      Acupuncturist Location  --    Printmaker Goals  --             PT Education - 07/03/19 1212    Education Details  update made to HEP with corner balance (see patient instructions)    Person(s) Educated  Patient    Methods  Explanation;Demonstration;Tactile cues;Verbal cues;Handout    Comprehension  Verbalized understanding;Returned demonstration;Need further instruction       PT Short Term Goals - 06/19/19 1423      PT SHORT TERM GOAL #1   Title  Patient will be independent with HEP to decrease fall risk and improve functional mobility. (ALL STGs DUE 07/12/19)    Time  4    Period  Weeks    Status  New    Target Date  07/12/19      PT SHORT TERM GOAL #2   Title  6 Minute Walk Test will be performed and LTG will be set accordingly to demonstrate improvement in endurance.    Baseline  06/19/19: 1221 feet    Time  4    Period  Weeks    Status  Achieved      PT SHORT TERM GOAL #3   Title  Patient will perform 5 Time Sit to Stand Test in </= 12 seconds to demonstrate improvement in functional mobility and decrease in fall risk.    Baseline  06/12/19: 14.88 seconds    Time  4    Period  Weeks    Status  New      PT SHORT TERM GOAL #4   Title  Patient will score >/= 15/30 on the functional gait assessment to demonstrate a decrease in fall risk.    Baseline  06/12/19: FGA =12/30    Time  4    Period  Weeks    Status  New      PT SHORT TERM GOAL #5   Title  Bioness trial will be performed with patient  and education provided to patient concerning findings and efficacy of use of this system for patient.    Time  4    Period  Weeks  Status  New        PT Long Term Goals - 06/19/19 1522      PT LONG TERM GOAL #1   Title  Patient will be independent with HEP to decrease fall risk and improve functional mobility. (ALL LTGs due 09/04/19)    Time  12    Period  Weeks    Status  New      PT LONG TERM GOAL #2   Title  Patient will improve 6 Minute Walk Test score by >/= 250 feet to demonstrate improvement in her endurance.    Time  12    Period  Weeks    Status  New      PT LONG TERM GOAL #3   Title  Patient will score </= 9 seconds on the 5 time sit to stand test in order to demonstrate improvement in functional strength and decreased fall risk.    Time  12    Period  Weeks    Status  New      PT LONG TERM GOAL #4   Title  Patient will score >/= 21/30 on the Functional Gait Assessment to demonstrate a decrease in fall risk.    Time  12    Period  Weeks    Status  New      PT LONG TERM GOAL #5   Title  Patient will demonstrate ability to ambulate >/= 500 feet mod I with LRAD safely including indoor and outdoor surfaces and inclines to demonstrate safety with community based ambulation and ambulation to/from mother's home.    Time  12    Period  Weeks    Status  New      PT LONG TERM GOAL #6   Title  Patient will demonstrate ability to climb and descend 4 steps (3 reps - 12 steps total) with mod I and use of one railing to demonstrate safety when navigating stairs when at mother's home.    Time  12    Period  Weeks    Status  New            Plan - 07/03/19 1213    Clinical Impression Statement  Today's skilled PT session focused on progression of gait training with foot up brace (patient purchased her own from [employer]) on multiple surfaces including inclines on multiple surfaces and introduced corner balance with higher level balance activities fostering an increase in  LLE WB. She tolerated it well, but was fatigued. When fatigued, educated on attending to L foot clearance, which improved safety. She is making good progress adn will benefit from continued skilled PT to maximize her functional mobility.    Personal Factors and Comorbidities  Time since onset of injury/illness/exacerbation;Social Background    Examination-Activity Limitations  Lift;Squat;Locomotion Level;Stairs;Stand    Examination-Participation Restrictions  Yard Work;Cleaning;Laundry;Community Activity;Shop    Stability/Clinical Decision Making  Evolving/Moderate complexity    Rehab Potential  Good    PT Frequency  1x / week   PT recommends 2x/wk for 10 weeks, but patient requests 1x/wk due to busy schedule (moving soon) - if able to increase to recommended frequency will resubmit POC   PT Duration  12 weeks    PT Treatment/Interventions  ADLs/Self Care Home Management;Electrical Stimulation;DME Instruction;Gait training;Therapeutic exercise;Therapeutic activities;Functional mobility training;Stair training;Balance training;Neuromuscular re-education;Patient/family education;Manual techniques;Orthotic Fit/Training;Passive range of motion;Energy conservation;Aquatic Therapy    PT Next Visit Plan  STGs due 07/12/19, one last trial of Bioness - if not, continue gait training and  endurance activity with foot up brace; continue to work on activity tolerance, LE strengthening and balance    Consulted and Agree with Plan of Care  Patient       Patient will benefit from skilled therapeutic intervention in order to improve the following deficits and impairments:  Abnormal gait, Decreased balance, Decreased endurance, Decreased mobility, Difficulty walking, Decreased knowledge of precautions, Decreased range of motion, Impaired perceived functional ability, Improper body mechanics, Decreased activity tolerance, Decreased coordination, Decreased knowledge of use of DME, Decreased safety awareness, Decreased  strength, Impaired flexibility  Visit Diagnosis: Muscle weakness (generalized)  Foot drop, left  Unsteadiness on feet  Other abnormalities of gait and mobility     Problem List Patient Active Problem List   Diagnosis Date Noted  . Left foot drop 05/25/2019  . Frequent UTI 11/25/2018  . Hyperlipidemia 10/29/2016  . Vitamin D deficiency 10/29/2016  . Numbness 04/19/2016  . Neck pain 10/18/2015  . Occipital neuralgia of left side 10/18/2015  . Polyneuropathy 04/04/2015  . Ataxic gait 12/24/2014  . Left carpal tunnel syndrome 09/27/2014  . Encounter for chemotherapy management 07/06/2014  . High risk medication use 05/14/2014  . Urinary incontinence 05/14/2014  . Depression with anxiety 05/06/2014  . Lower extremity weakness 04/28/2014  . Numbness of both lower extremities 04/28/2014  . Multiple falls 04/28/2014  . Nausea & vomiting 04/28/2014  . Relapsing remitting multiple sclerosis ([place] [facility]) 07/04/2012  . Memory loss 12/03/2011  . Multiple sclerosis ([place]) 09/14/2008  . Constipation 09/14/2008  . RUQ PAIN 09/14/2008    Juliann Pulse, PT, DPT  07/03/2019, 12:18 PM  [facility] [address] [place], [place], [address] Phone: [phone]   Fax:  [phone]  Name: Ann Oconnor MRN: [id number] Date of Birth: 1975-06-18

## 2019-07-10 ENCOUNTER — Other Ambulatory Visit: Payer: Self-pay

## 2019-07-10 ENCOUNTER — Ambulatory Visit: Payer: Medicare Other | Attending: Neurology | Admitting: Physical Therapy

## 2019-07-10 ENCOUNTER — Encounter: Payer: Self-pay | Admitting: Physical Therapy

## 2019-07-10 DIAGNOSIS — R2681 Unsteadiness on feet: Secondary | ICD-10-CM | POA: Diagnosis present

## 2019-07-10 DIAGNOSIS — M21372 Foot drop, left foot: Secondary | ICD-10-CM | POA: Diagnosis present

## 2019-07-10 DIAGNOSIS — R278 Other lack of coordination: Secondary | ICD-10-CM | POA: Diagnosis present

## 2019-07-10 DIAGNOSIS — R2689 Other abnormalities of gait and mobility: Secondary | ICD-10-CM

## 2019-07-10 DIAGNOSIS — M6281 Muscle weakness (generalized): Secondary | ICD-10-CM | POA: Diagnosis not present

## 2019-07-10 NOTE — Therapy (Signed)
[facility] [address] [place], [place], [address] Phone: [phone]   Fax:  [phone]  Physical Therapy Treatment  Patient Details  Name: Ann Oconnor MRN: [id number] Date of Birth: 09-20-1975 Referring Provider (PT): Christell Constant, MD   Encounter Date: 07/10/2019  PT End of Session - 07/10/19 1108    Visit Number  5    Number of Visits  13    Date for PT Re-Evaluation  09/10/19    Authorization Type  [facility] Medicare   10th visit progress note   PT Start Time  1104    PT Stop Time  1143    PT Time Calculation (min)  39 min    Equipment Utilized During Treatment  Gait belt;Other (comment)   foot up brace; Bioness   Activity Tolerance  Patient tolerated treatment well    Behavior During Therapy  WFL for tasks assessed/performed       Past Medical History:  Diagnosis Date  . Anxiety   . Family history of adverse reaction to anesthesia    Father - difficulty awaken- has sleep apena  . Gait instability   . Head injury, closed, with brief LOC ([facility]) 2003   fall  . Incontinence   . 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  Colpo normal with neg ECC  . Movement disorder   . MS (multiple sclerosis) ([facility]) 2002  . Seizures ([facility]) 2003   none since 2003  . UTI (lower urinary tract infection)   . Vision abnormalities     Past Surgical History:  Procedure Laterality Date  . CERVICAL CONE BIOPSY  2008   CIN 2  . COLONOSCOPY    . PORT-A-CATH REMOVAL N/A 02/16/2016   Procedure: REMOVAL PORT-A-CATH;  Surgeon: Donnie Mesa, MD;  Location: [facility];  Service: General;  Laterality: N/A;  . PORTACATH PLACEMENT Left 08/18/14  . PORTACATH PLACEMENT Left 08/18/2014   Procedure: LEFT SUBCLAVIAN VEIN PORT PLACEMENT;  Surgeon: Donnie Mesa, MD;  Location: [facility];  Service: General;  Laterality: Left;    There were no vitals filed for this visit.  Subjective  Assessment - 07/10/19 1106    Subjective  Has been busy with moving her and her mom's house getting ready for estate sale. No falls or pain to report.    Pertinent History  dx MS 2002 with greates impairment in left lower extremity (L foot drop) and most recent severe exacerbation in 2016, HLD, hx of seizures (none since 2003)    How long can you walk comfortably?  I become unsteady/fatigued after being active/standing for > 45 min - 1 hour    Patient Stated Goals  Bioness, gain more strength in my left side    Currently in Pain?  No/denies         [facility] PT Assessment - 07/10/19 1108      Transfers   Transfers  Sit to Stand;Stand to Sit    Sit to Stand  6: Modified independent (Device/Increase time)    Five time sit to stand comments   12.87 sec's no UE support using standard height chair, no retropulsion and controlled movements.     Stand to Sit  6: Modified independent (Device/Increase time);5: Supervision      Ambulation/Gait   Ambulation/Gait  Yes    Ambulation/Gait Assistance  5: Supervision    Ambulation Distance (Feet)  1000 Feet    Assistive device  None;Other (Comment)  foot up brace on left LE   Gait Pattern  Step-through pattern;Decreased stride length;Decreased arm swing - right;Decreased arm swing - left    Ambulation Surface  Level;Unlevel;Indoor;Outdoor;Paved      Functional Gait  Assessment   Gait assessed   Yes    Gait Level Surface  Walks 20 ft in less than 7 sec but greater than 5.5 sec, uses assistive device, slower speed, mild gait deviations, or deviates 6-10 in outside of the 12 in walkway width.   6.59 sec's   Change in Gait Speed  Able to smoothly change walking speed without loss of balance or gait deviation. Deviate no more than 6 in outside of the 12 in walkway width.    Gait with Horizontal Head Turns  Performs head turns smoothly with no change in gait. Deviates no more than 6 in outside 12 in walkway width    Gait with Vertical Head Turns  Performs  task with slight change in gait velocity (eg, minor disruption to smooth gait path), deviates 6 - 10 in outside 12 in walkway width or uses assistive device    Gait and Pivot Turn  Pivot turns safely within 3 sec and stops quickly with no loss of balance.    Step Over Obstacle  Is able to step over 2 stacked shoe boxes taped together (9 in total height) without changing gait speed. No evidence of imbalance.    Gait with Narrow Base of Support  Ambulates less than 4 steps heel to toe or cannot perform without assistance.    Gait with Eyes Closed  Walks 20 ft, uses assistive device, slower speed, mild gait deviations, deviates 6-10 in outside 12 in walkway width. Ambulates 20 ft in less than 9 sec but greater than 7 sec.   >9 sec's needed   Ambulating Backwards  Walks 20 ft, uses assistive device, slower speed, mild gait deviations, deviates 6-10 in outside 12 in walkway width.    Steps  Alternating feet, must use rail.    Total Score  22    FGA comment:  19-24 = medium fall risk           [facility] Adult PT Treatment/Exercise - 07/10/19 1108      High Level Balance   High Level Balance Activities  Side stepping;Marching forwards;Marching backwards   tandem fwd/bwd   High Level Balance Comments  on red mat next to counter:       Self-Care   Self-Care  Other Self-Care Comments    Other Self-Care Comments   reports no issues with current HEP. Still challenging.           Balance Exercises - 07/10/19 1133      Balance Exercises: Standing   Standing Eyes Closed  Narrow base of support (BOS);Wide (BOA);Head turns;Foam/compliant surface;Other reps (comment);30 secs;Limitations    Standing Eyes Closed Limitations  on airex with no UE support: feet close together for EC no head movements, progressing to feet apart for [facility]           PT Short Term Goals - 07/10/19 1149      PT SHORT TERM GOAL #1   Title  Patient will be independent with HEP to decrease fall risk and improve functional  mobility. (ALL STGs DUE 07/12/19)    Baseline  07/10/19: met with current program    Status  Achieved    Target Date  07/12/19      PT SHORT TERM GOAL #2   Title  6 Minute Walk Test will be performed and LTG will be set accordingly to demonstrate improvement in endurance.    Baseline  06/19/19: 1221 feet    Status  Achieved      PT SHORT TERM GOAL #3   Title  Patient will perform 5 Time Sit to Stand Test in </= 12 seconds to demonstrate improvement in functional mobility and decrease in fall risk.    Baseline  07/10/19: 12.87 sec's no UE support from standard height surface    Status  Achieved      PT SHORT TERM GOAL #4   Title  Patient will score >/= 15/30 on the functional gait assessment to demonstrate a decrease in fall risk.    Baseline  07/10/19: 22/30 scored today    Status  Achieved      PT SHORT TERM GOAL #5   Title  Bioness trial will be performed with patient and education provided to patient concerning findings and efficacy of use of this system for patient.    Baseline  07/10/19: has been trialed with pt having decreased tolerance. using Foot up brace with gait with on issues.    Status  Achieved        PT Long Term Goals - 06/19/19 1522      PT LONG TERM GOAL #1   Title  Patient will be independent with HEP to decrease fall risk and improve functional mobility. (ALL LTGs due 09/04/19)    Time  12    Period  Weeks    Status  New      PT LONG TERM GOAL #2   Title  Patient will improve 6 Minute Walk Test score by >/= 250 feet to demonstrate improvement in her endurance.    Time  12    Period  Weeks    Status  New      PT LONG TERM GOAL #3   Title  Patient will score </= 9 seconds on the 5 time sit to stand test in order to demonstrate improvement in functional strength and decreased fall risk.    Time  12    Period  Weeks    Status  New      PT LONG TERM GOAL #4   Title  Patient will score >/= 21/30 on the Functional Gait Assessment to demonstrate a decrease in fall  risk.    Time  12    Period  Weeks    Status  New      PT LONG TERM GOAL #5   Title  Patient will demonstrate ability to ambulate >/= 500 feet mod I with LRAD safely including indoor and outdoor surfaces and inclines to demonstrate safety with community based ambulation and ambulation to/from mother's home.    Time  12    Period  Weeks    Status  New      PT LONG TERM GOAL #6   Title  Patient will demonstrate ability to climb and descend 4 steps (3 reps - 12 steps total) with mod I and use of one railing to demonstrate safety when navigating stairs when at mother's home.    Time  12    Period  Weeks    Status  New            Plan - 07/10/19 1108    Clinical Impression Statement  Today's skilled session focused on progress toward STGs with all goals met. Remainder of session continued to focus on gait on various  surfacs with foot brace with emphasis on increased distance for activity tolerance. Remainder of session continued to focus on balance reactions on compliant surfaces with up to min assist needed. Rest breaks taken due to fatigue. The pt is progressing toward goals and should benefit from continued PT to progress toward unmet goals.    Personal Factors and Comorbidities  Time since onset of injury/illness/exacerbation;Social Background    Examination-Activity Limitations  Lift;Squat;Locomotion Level;Stairs;Stand    Examination-Participation Restrictions  Yard Work;Cleaning;Laundry;Community Activity;Shop    Stability/Clinical Decision Making  Evolving/Moderate complexity    Rehab Potential  Good    PT Frequency  1x / week   PT recommends 2x/wk for 10 weeks, but patient requests 1x/wk due to busy schedule (moving soon) - if able to increase to recommended frequency will resubmit POC   PT Duration  12 weeks    PT Treatment/Interventions  ADLs/Self Care Home Management;Electrical Stimulation;DME Instruction;Gait training;Therapeutic exercise;Therapeutic activities;Functional  mobility training;Stair training;Balance training;Neuromuscular re-education;Patient/family education;Manual techniques;Orthotic Fit/Training;Passive range of motion;Energy conservation;Aquatic Therapy    PT Next Visit Plan  one last trial of Bioness - if not, continue gait training and endurance activity with foot up brace; continue to work on activity tolerance, LE strengthening and balance    Consulted and Agree with Plan of Care  Patient       Patient will benefit from skilled therapeutic intervention in order to improve the following deficits and impairments:  Abnormal gait, Decreased balance, Decreased endurance, Decreased mobility, Difficulty walking, Decreased knowledge of precautions, Decreased range of motion, Impaired perceived functional ability, Improper body mechanics, Decreased activity tolerance, Decreased coordination, Decreased knowledge of use of DME, Decreased safety awareness, Decreased strength, Impaired flexibility  Visit Diagnosis: Muscle weakness (generalized)  Foot drop, left  Unsteadiness on feet  Other abnormalities of gait and mobility  Other lack of coordination     Problem List Patient Active Problem List   Diagnosis Date Noted  . Left foot drop 05/25/2019  . Frequent UTI 11/25/2018  . Hyperlipidemia 10/29/2016  . Vitamin D deficiency 10/29/2016  . Numbness 04/19/2016  . Neck pain 10/18/2015  . Occipital neuralgia of left side 10/18/2015  . Polyneuropathy 04/04/2015  . Ataxic gait 12/24/2014  . Left carpal tunnel syndrome 09/27/2014  . Encounter for chemotherapy management 07/06/2014  . High risk medication use 05/14/2014  . Urinary incontinence 05/14/2014  . Depression with anxiety 05/06/2014  . Lower extremity weakness 04/28/2014  . Numbness of both lower extremities 04/28/2014  . Multiple falls 04/28/2014  . Nausea & vomiting 04/28/2014  . Relapsing remitting multiple sclerosis ([place]) 07/04/2012  . Memory loss 12/03/2011  . Multiple  sclerosis ([place]) 09/14/2008  . Constipation 09/14/2008  . RUQ PAIN 09/14/2008    Willow Ora, PTA, [facility] [address] [place], [place] [address] [phone] 07/10/19, 11:54 AM   Name: JULITA OZBUN MRN: [id number] Date of Birth: October 31, 1975

## 2019-07-17 ENCOUNTER — Encounter: Payer: Self-pay | Admitting: Physical Therapy

## 2019-07-17 ENCOUNTER — Other Ambulatory Visit: Payer: Self-pay

## 2019-07-17 ENCOUNTER — Ambulatory Visit: Payer: Medicare Other | Admitting: Physical Therapy

## 2019-07-17 DIAGNOSIS — M6281 Muscle weakness (generalized): Secondary | ICD-10-CM

## 2019-07-17 DIAGNOSIS — R2681 Unsteadiness on feet: Secondary | ICD-10-CM

## 2019-07-17 DIAGNOSIS — M21372 Foot drop, left foot: Secondary | ICD-10-CM

## 2019-07-17 DIAGNOSIS — R2689 Other abnormalities of gait and mobility: Secondary | ICD-10-CM

## 2019-07-17 NOTE — Therapy (Signed)
[facility] [address] [place], [place], [address] Phone: [phone]   Fax:  [phone]  Physical Therapy Treatment  Patient Details  Name: Ann Oconnor MRN: [id number] Date of Birth: 1976-02-09 Referring Provider (PT): Christell Constant, MD   Encounter Date: 07/17/2019  PT End of Session - 07/17/19 1021    Visit Number  6    Number of Visits  13    Date for PT Re-Evaluation  09/10/19    Authorization Type  [employer] Medicare   10th visit progress note   PT Start Time  1018    PT Stop Time  1100    PT Time Calculation (min)  42 min    Equipment Utilized During Treatment  Gait belt;Other (comment)   foot up brace; Bioness   Activity Tolerance  Patient tolerated treatment well    Behavior During Therapy  WFL for tasks assessed/performed       Past Medical History:  Diagnosis Date  . Anxiety   . Family history of adverse reaction to anesthesia    Father - difficulty awaken- has sleep apena  . Gait instability   . Head injury, closed, with brief LOC ([place]) 2003   fall  . Incontinence   . 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  Colpo normal with neg ECC  . Movement disorder   . MS (multiple sclerosis) ([place]) 2002  . Seizures ([place]) 2003   none since 2003  . UTI (lower urinary tract infection)   . Vision abnormalities     Past Surgical History:  Procedure Laterality Date  . CERVICAL CONE BIOPSY  2008   CIN 2  . COLONOSCOPY    . PORT-A-CATH REMOVAL N/A 02/16/2016   Procedure: REMOVAL PORT-A-CATH;  Surgeon: Donnie Mesa, MD;  Location: [facility];  Service: General;  Laterality: N/A;  . PORTACATH PLACEMENT Left 08/18/14  . PORTACATH PLACEMENT Left 08/18/2014   Procedure: LEFT SUBCLAVIAN VEIN PORT PLACEMENT;  Surgeon: Donnie Mesa, MD;  Location: [facility];  Service: General;  Laterality: Left;    There were no vitals filed for this visit.  Subjective  Assessment - 07/17/19 1020    Subjective  No new complaints. No falls or pain to report, "just tired". Foot up brace is working well.    Pertinent History  dx MS 2002 with greates impairment in left lower extremity (L foot drop) and most recent severe exacerbation in 2016, HLD, hx of seizures (none since 2003)    How long can you walk comfortably?  I become unsteady/fatigued after being active/standing for > 45 min - 1 hour    Patient Stated Goals  Bioness, gain more strength in my left side    Currently in Pain?  No/denies           [facility] Adult PT Treatment/Exercise - 07/17/19 1022      Transfers   Transfers  Sit to Stand;Stand to Sit    Sit to Stand  6: Modified independent (Device/Increase time)    Stand to Sit  6: Modified independent (Device/Increase time);5: Supervision      Ambulation/Gait   Ambulation/Gait  Yes    Ambulation/Gait Assistance  5: Supervision    Ambulation/Gait Assistance Details  occasional toe scuffing noted, no balance issues noted.     Ambulation Distance (Feet)  1000 Feet    Assistive device  None;Other (Comment)   foot up brace   Gait Pattern  Step-through pattern;Decreased  stride length;Decreased arm swing - right;Decreased arm swing - left    Ambulation Surface  Level;Unlevel;Indoor;Outdoor;Paved      High Level Balance   High Level Balance Activities  Side stepping;Marching forwards;Marching backwards;Tandem walking    High Level Balance Comments  red/blue mat next to counter: 3 laps each/each way with occasional touch to counter for balance. cues on posture, weight shifting and ex form/technique needed.           Balance Exercises - 07/17/19 1054      Balance Exercises: Standing   Standing Eyes Closed  Narrow base of support (BOS);Wide (BOA);Head turns;Foam/compliant surface;Other reps (comment);30 secs;Limitations    Standing Eyes Closed Limitations  on airex with no UE support: feet together for EC no head movements, progressing to feet  apart for EC head movements left<>right, up<>down and diagonals both ways for ~10 reps each. min guard assist to min assist with occasional touch to wall or chair for balance.     Partial Tandem Stance  Eyes closed;Foam/compliant surface;Intermittent upper extremity support;3 reps;10 secs;Limitations    Partial Tandem Stance Limitations  on airex- 3 reps each foot forward with light finger tip support on sturdy surface needed along with min guard to min assist for balance.           PT Short Term Goals - 07/10/19 1149      PT SHORT TERM GOAL #1   Title  Patient will be independent with HEP to decrease fall risk and improve functional mobility. (ALL STGs DUE 07/12/19)    Baseline  07/10/19: met with current program    Status  Achieved    Target Date  07/12/19      PT SHORT TERM GOAL #2   Title  6 Minute Walk Test will be performed and LTG will be set accordingly to demonstrate improvement in endurance.    Baseline  06/19/19: 1221 feet    Status  Achieved      PT SHORT TERM GOAL #3   Title  Patient will perform 5 Time Sit to Stand Test in </= 12 seconds to demonstrate improvement in functional mobility and decrease in fall risk.    Baseline  07/10/19: 12.87 sec's no UE support from standard height surface    Status  Achieved      PT SHORT TERM GOAL #4   Title  Patient will score >/= 15/30 on the functional gait assessment to demonstrate a decrease in fall risk.    Baseline  07/10/19: 22/30 scored today    Status  Achieved      PT SHORT TERM GOAL #5   Title  Bioness trial will be performed with patient and education provided to patient concerning findings and efficacy of use of this system for patient.    Baseline  07/10/19: has been trialed with pt having decreased tolerance. using Foot up brace with gait with on issues.    Status  Achieved        PT Long Term Goals - 06/19/19 1522      PT LONG TERM GOAL #1   Title  Patient will be independent with HEP to decrease fall risk and improve  functional mobility. (ALL LTGs due 09/04/19)    Time  12    Period  Weeks    Status  New      PT LONG TERM GOAL #2   Title  Patient will improve 6 Minute Walk Test score by >/= 250 feet to demonstrate improvement in her endurance.  Time  12    Period  Weeks    Status  New      PT LONG TERM GOAL #3   Title  Patient will score </= 9 seconds on the 5 time sit to stand test in order to demonstrate improvement in functional strength and decreased fall risk.    Time  12    Period  Weeks    Status  New      PT LONG TERM GOAL #4   Title  Patient will score >/= 21/30 on the Functional Gait Assessment to demonstrate a decrease in fall risk.    Time  12    Period  Weeks    Status  New      PT LONG TERM GOAL #5   Title  Patient will demonstrate ability to ambulate >/= 500 feet mod I with LRAD safely including indoor and outdoor surfaces and inclines to demonstrate safety with community based ambulation and ambulation to/from mother's home.    Time  12    Period  Weeks    Status  New      PT LONG TERM GOAL #6   Title  Patient will demonstrate ability to climb and descend 4 steps (3 reps - 12 steps total) with mod I and use of one railing to demonstrate safety when navigating stairs when at mother's home.    Time  12    Period  Weeks    Status  New            Plan - 07/17/19 1021    Clinical Impression Statement  Today's skilled session continued to focus on gait with foot up brace on various surfaces with no issues reported or noted. Pt did state she is no longer interested in persuing Bioness as she likes how the foot up brace works. Remainder of session continued to focus on balance reactions with rest breaks taken as needed. No other issues reported with session. The pt is progressing toward goals and should benefit from continued PT to progress toward unmet goals.    Personal Factors and Comorbidities  Time since onset of injury/illness/exacerbation;Social Background     Examination-Activity Limitations  Lift;Squat;Locomotion Level;Stairs;Stand    Examination-Participation Restrictions  Yard Work;Cleaning;Laundry;Community Activity;Shop    Stability/Clinical Decision Making  Evolving/Moderate complexity    Rehab Potential  Good    PT Frequency  1x / week   PT recommends 2x/wk for 10 weeks, but patient requests 1x/wk due to busy schedule (moving soon) - if able to increase to recommended frequency will resubmit POC   PT Duration  12 weeks    PT Treatment/Interventions  ADLs/Self Care Home Management;Electrical Stimulation;DME Instruction;Gait training;Therapeutic exercise;Therapeutic activities;Functional mobility training;Stair training;Balance training;Neuromuscular re-education;Patient/family education;Manual techniques;Orthotic Fit/Training;Passive range of motion;Energy conservation;Aquatic Therapy    PT Next Visit Plan  continue gait training and endurance activity with foot up brace; continue to work on activity tolerance, LE strengthening and balance    Consulted and Agree with Plan of Care  Patient       Patient will benefit from skilled therapeutic intervention in order to improve the following deficits and impairments:  Abnormal gait, Decreased balance, Decreased endurance, Decreased mobility, Difficulty walking, Decreased knowledge of precautions, Decreased range of motion, Impaired perceived functional ability, Improper body mechanics, Decreased activity tolerance, Decreased coordination, Decreased knowledge of use of DME, Decreased safety awareness, Decreased strength, Impaired flexibility  Visit Diagnosis: Muscle weakness (generalized)  Foot drop, left  Unsteadiness on feet  Other abnormalities of gait  and mobility     Problem List Patient Active Problem List   Diagnosis Date Noted  . Left foot drop 05/25/2019  . Frequent UTI 11/25/2018  . Hyperlipidemia 10/29/2016  . Vitamin D deficiency 10/29/2016  . Numbness 04/19/2016  . Neck pain  10/18/2015  . Occipital neuralgia of left side 10/18/2015  . Polyneuropathy 04/04/2015  . Ataxic gait 12/24/2014  . Left carpal tunnel syndrome 09/27/2014  . Encounter for chemotherapy management 07/06/2014  . High risk medication use 05/14/2014  . Urinary incontinence 05/14/2014  . Depression with anxiety 05/06/2014  . Lower extremity weakness 04/28/2014  . Numbness of both lower extremities 04/28/2014  . Multiple falls 04/28/2014  . Nausea & vomiting 04/28/2014  . Relapsing remitting multiple sclerosis ([facility]) 07/04/2012  . Memory loss 12/03/2011  . Multiple sclerosis ([place]) 09/14/2008  . Constipation 09/14/2008  . RUQ PAIN 09/14/2008    Willow Ora, PTA, [facility] [address] [place], [place] [address] [phone] 07/17/19, 4:27 PM   Name: Ann Oconnor MRN: [id number] Date of Birth: Jun 12, 1975

## 2019-07-24 ENCOUNTER — Ambulatory Visit: Payer: Medicare Other | Admitting: Physical Therapy

## 2019-07-24 ENCOUNTER — Other Ambulatory Visit: Payer: Self-pay

## 2019-07-24 ENCOUNTER — Encounter: Payer: Self-pay | Admitting: Physical Therapy

## 2019-07-24 DIAGNOSIS — R2689 Other abnormalities of gait and mobility: Secondary | ICD-10-CM

## 2019-07-24 DIAGNOSIS — M6281 Muscle weakness (generalized): Secondary | ICD-10-CM | POA: Diagnosis not present

## 2019-07-24 DIAGNOSIS — M21372 Foot drop, left foot: Secondary | ICD-10-CM

## 2019-07-24 DIAGNOSIS — R2681 Unsteadiness on feet: Secondary | ICD-10-CM

## 2019-07-24 NOTE — Therapy (Signed)
[facility] [address] [place], [place], [address] Phone: [phone]   Fax:  [phone]  Physical Therapy Treatment  Patient Details  Name: Ann Oconnor MRN: [id number] Date of Birth: 03/14/1975 Referring Provider (PT): Christell Constant, MD   Encounter Date: 07/24/2019  PT End of Session - 07/24/19 1024    Visit Number  7    Number of Visits  13    Date for PT Re-Evaluation  09/10/19    Authorization Type  [facility] Medicare   10th visit progress note   PT Start Time  1018    PT Stop Time  1058    PT Time Calculation (min)  40 min    Equipment Utilized During Treatment  Gait belt;Other (comment)   foot up brace; Bioness   Activity Tolerance  Patient tolerated treatment well    Behavior During Therapy  WFL for tasks assessed/performed       Past Medical History:  Diagnosis Date  . Anxiety   . Family history of adverse reaction to anesthesia    Father - difficulty awaken- has sleep apena  . Gait instability   . Head injury, closed, with brief LOC ([place]) 2003   fall  . Incontinence   . 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  Colpo normal with neg ECC  . Movement disorder   . MS (multiple sclerosis) ([facility] [facility]) 2002  . Seizures ([place]) 2003   none since 2003  . UTI (lower urinary tract infection)   . Vision abnormalities     Past Surgical History:  Procedure Laterality Date  . CERVICAL CONE BIOPSY  2008   CIN 2  . COLONOSCOPY    . PORT-A-CATH REMOVAL N/A 02/16/2016   Procedure: REMOVAL PORT-A-CATH;  Surgeon: Donnie Mesa, MD;  Location: [facility];  Service: General;  Laterality: N/A;  . PORTACATH PLACEMENT Left 08/18/14  . PORTACATH PLACEMENT Left 08/18/2014   Procedure: LEFT SUBCLAVIAN VEIN PORT PLACEMENT;  Surgeon: Donnie Mesa, MD;  Location: [facility];  Service: General;  Laterality: Left;    There were no vitals filed for this visit.  Subjective  Assessment - 07/24/19 1019    Subjective  Has ordered the foot up brace that can be worn with sandles, just can not figure it out. No falls.    Pertinent History  dx MS 2002 with greates impairment in left lower extremity (L foot drop) and most recent severe exacerbation in 2016, HLD, hx of seizures (none since 2003)    How long can you walk comfortably?  I become unsteady/fatigued after being active/standing for > 45 min - 1 hour    Patient Stated Goals  Bioness, gain more strength in my left side    Currently in Pain?  No/denies              [facility] Adult PT Treatment/Exercise - 07/24/19 1024      Transfers   Transfers  Sit to Stand;Stand to Sit    Sit to Stand  6: Modified independent (Device/Increase time)    Stand to Sit  6: Modified independent (Device/Increase time);5: Supervision      Ambulation/Gait   Ambulation/Gait  Yes    Ambulation/Gait Assistance  5: Supervision;6: Modified independent (Device/Increase time)    Ambulation/Gait Assistance Details  supervision on grass hill, otherwise Mod I with foot up brace    Ambulation Distance (Feet)  1500 Feet   x1   Assistive device  None;Other (Comment)   foot up brace   Gait Pattern  Step-through pattern;Decreased stride length;Decreased arm swing - right;Decreased arm swing - left    Ambulation Surface  Level;Unlevel;Indoor;Outdoor;Paved;Grass      Self-Care   Self-Care  Other Self-Care Comments    Other Self-Care Comments   looked at pt's new foot up brace that goes around foot. upon inspection noted that the company sent her the wrong brace, not even sure what this brace was for or how to use it. Pt plans to return it and get the correct one.           Balance Exercises - 07/24/19 1036      Balance Exercises: Standing   SLS with Vectors  Foam/compliant surface;Upper extremity assist 1;Other reps (comment);Limitations    SLS with Vectors Limitations  1 inch foam with 2 tall cones in front: alternating fwd foot taps,  then alternating cross foot taps for 10 reps each, min HHA with cues on stance position and weight shifting for balance assistance.     Rockerboard  Anterior/posterior;Lateral;Head turns;EO;EC;30 seconds;10 reps;Limitations;Intermittent UE support    Rockerboard Limitations  performed both ways on balance board with no UE support to light fingertip support: rocking the board with EO, progressing to EC, then holding the board steady for EC no head movements. min guard to min assist for balance with cues on posture and weight shifting for balance recovery, pt tends to have posterior balance loss.            PT Short Term Goals - 07/10/19 1149      PT SHORT TERM GOAL #1   Title  Patient will be independent with HEP to decrease fall risk and improve functional mobility. (ALL STGs DUE 07/12/19)    Baseline  07/10/19: met with current program    Status  Achieved    Target Date  07/12/19      PT SHORT TERM GOAL #2   Title  6 Minute Walk Test will be performed and LTG will be set accordingly to demonstrate improvement in endurance.    Baseline  06/19/19: 1221 feet    Status  Achieved      PT SHORT TERM GOAL #3   Title  Patient will perform 5 Time Sit to Stand Test in </= 12 seconds to demonstrate improvement in functional mobility and decrease in fall risk.    Baseline  07/10/19: 12.87 sec's no UE support from standard height surface    Status  Achieved      PT SHORT TERM GOAL #4   Title  Patient will score >/= 15/30 on the functional gait assessment to demonstrate a decrease in fall risk.    Baseline  07/10/19: 22/30 scored today    Status  Achieved      PT SHORT TERM GOAL #5   Title  Bioness trial will be performed with patient and education provided to patient concerning findings and efficacy of use of this system for patient.    Baseline  07/10/19: has been trialed with pt having decreased tolerance. using Foot up brace with gait with on issues.    Status  Achieved        PT Long Term Goals  - 06/19/19 1522      PT LONG TERM GOAL #1   Title  Patient will be independent with HEP to decrease fall risk and improve functional mobility. (ALL LTGs due 09/04/19)    Time  12    Period  Weeks  Status  New      PT LONG TERM GOAL #2   Title  Patient will improve 6 Minute Walk Test score by >/= 250 feet to demonstrate improvement in her endurance.    Time  12    Period  Weeks    Status  New      PT LONG TERM GOAL #3   Title  Patient will score </= 9 seconds on the 5 time sit to stand test in order to demonstrate improvement in functional strength and decreased fall risk.    Time  12    Period  Weeks    Status  New      PT LONG TERM GOAL #4   Title  Patient will score >/= 21/30 on the Functional Gait Assessment to demonstrate a decrease in fall risk.    Time  12    Period  Weeks    Status  New      PT LONG TERM GOAL #5   Title  Patient will demonstrate ability to ambulate >/= 500 feet mod I with LRAD safely including indoor and outdoor surfaces and inclines to demonstrate safety with community based ambulation and ambulation to/from mother's home.    Time  12    Period  Weeks    Status  New      PT LONG TERM GOAL #6   Title  Patient will demonstrate ability to climb and descend 4 steps (3 reps - 12 steps total) with mod I and use of one railing to demonstrate safety when navigating stairs when at mother's home.    Time  12    Period  Weeks    Status  New            Plan - 07/24/19 1024    Clinical Impression Statement  Today's skilled session continued to address activity tolerance and balance reactions on compliant surface with/without vision removed. The pt is now Mod I to I with gait on all outdoor surface with foot up brace. Would benefit from continued work on balance reactions progressing toward unmet LTGs.    Personal Factors and Comorbidities  Time since onset of injury/illness/exacerbation;Social Background    Examination-Activity Limitations   Lift;Squat;Locomotion Level;Stairs;Stand    Examination-Participation Restrictions  Yard Work;Cleaning;Laundry;Community Activity;Shop    Stability/Clinical Decision Making  Evolving/Moderate complexity    Rehab Potential  Good    PT Frequency  1x / week   PT recommends 2x/wk for 10 weeks, but patient requests 1x/wk due to busy schedule (moving soon) - if able to increase to recommended frequency will resubmit POC   PT Duration  12 weeks    PT Treatment/Interventions  ADLs/Self Care Home Management;Electrical Stimulation;DME Instruction;Gait training;Therapeutic exercise;Therapeutic activities;Functional mobility training;Stair training;Balance training;Neuromuscular re-education;Patient/family education;Manual techniques;Orthotic Fit/Training;Passive range of motion;Energy conservation;Aquatic Therapy    PT Next Visit Plan  co continue to work on activity tolerance, LE strengthening and balance- specifically on compliant surfaces and single leg stance. pt appears ready to discharge in next 2 visits and is in agreement with this due to rapid progress.    Consulted and Agree with Plan of Care  Patient       Patient will benefit from skilled therapeutic intervention in order to improve the following deficits and impairments:  Abnormal gait, Decreased balance, Decreased endurance, Decreased mobility, Difficulty walking, Decreased knowledge of precautions, Decreased range of motion, Impaired perceived functional ability, Improper body mechanics, Decreased activity tolerance, Decreased coordination, Decreased knowledge of use of DME, Decreased safety  awareness, Decreased strength, Impaired flexibility  Visit Diagnosis: Muscle weakness (generalized)  Foot drop, left  Unsteadiness on feet  Other abnormalities of gait and mobility     Problem List Patient Active Problem List   Diagnosis Date Noted  . Left foot drop 05/25/2019  . Frequent UTI 11/25/2018  . Hyperlipidemia 10/29/2016  . Vitamin  D deficiency 10/29/2016  . Numbness 04/19/2016  . Neck pain 10/18/2015  . Occipital neuralgia of left side 10/18/2015  . Polyneuropathy 04/04/2015  . Ataxic gait 12/24/2014  . Left carpal tunnel syndrome 09/27/2014  . Encounter for chemotherapy management 07/06/2014  . High risk medication use 05/14/2014  . Urinary incontinence 05/14/2014  . Depression with anxiety 05/06/2014  . Lower extremity weakness 04/28/2014  . Numbness of both lower extremities 04/28/2014  . Multiple falls 04/28/2014  . Nausea & vomiting 04/28/2014  . Relapsing remitting multiple sclerosis ([place]) 07/04/2012  . Memory loss 12/03/2011  . Multiple sclerosis ([place]) 09/14/2008  . Constipation 09/14/2008  . RUQ PAIN 09/14/2008    Willow Ora, PTA, [facility] [address] [place], [place] [address] [phone] 07/24/19, 4:33 PM   Name: Ann Oconnor MRN: [id number] Date of Birth: 11/17/1975

## 2019-07-31 ENCOUNTER — Ambulatory Visit: Payer: Medicare Other | Admitting: Rehabilitative and Restorative Service Providers"

## 2019-07-31 ENCOUNTER — Other Ambulatory Visit: Payer: Self-pay

## 2019-07-31 ENCOUNTER — Encounter: Payer: Self-pay | Admitting: Rehabilitative and Restorative Service Providers"

## 2019-07-31 DIAGNOSIS — R2681 Unsteadiness on feet: Secondary | ICD-10-CM

## 2019-07-31 DIAGNOSIS — M6281 Muscle weakness (generalized): Secondary | ICD-10-CM | POA: Diagnosis not present

## 2019-07-31 DIAGNOSIS — M21372 Foot drop, left foot: Secondary | ICD-10-CM

## 2019-07-31 DIAGNOSIS — R2689 Other abnormalities of gait and mobility: Secondary | ICD-10-CM

## 2019-07-31 NOTE — Therapy (Signed)
[facility] [address] [place], [place], [address] Phone: [phone]   Fax:  [phone]  Physical Therapy  Discharge   Patient Details  Name: Ann Oconnor MRN: [id number] Date of Birth: [birth year] Referring Provider (PT): Christell Constant, MD  PHYSICAL THERAPY DISCHARGE SUMMARY  Visits from Start of Care: 8  Current functional level related to goals / functional outcomes: See PT assessment, below, for more details Functional Gait Assessment score at baseline = 12/30; at discharge = 24/30   Remaining deficits: See clinical impression statement, below    Education / Equipment: Use of L foot up brace, extensive ongoing HEP (see patient instructions)   Plan: Patient agrees to discharge.  Patient goals were met. Patient is being discharged due to being pleased with the current functional level.  ?????         Encounter Date: 07/31/2019  PT End of Session - 07/31/19 1450    Visit Number  8    Number of Visits  13    Date for PT Re-Evaluation  09/10/19    Authorization Type  [facility] Medicare   10th visit progress note   PT Start Time  1017    PT Stop Time  1103    PT Time Calculation (min)  46 min    Equipment Utilized During Treatment  Gait belt;Other (comment)   foot up brace   Activity Tolerance  Patient tolerated treatment well    Behavior During Therapy  [facility] for tasks assessed/performed       Past Medical History:  Diagnosis Date  . Anxiety   . Family history of adverse reaction to anesthesia    Father - difficulty awaken- has sleep apena  . Gait instability   . Head injury, closed, with brief LOC ([place]) 2003   fall  . Incontinence   . 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  Colpo normal with neg ECC  . Movement disorder   . MS (multiple sclerosis) ([place]) 2002  . Seizures ([place]) 2003   none since 2003  . UTI (lower urinary tract  infection)   . Vision abnormalities     Past Surgical History:  Procedure Laterality Date  . CERVICAL CONE BIOPSY  2008   CIN 2  . COLONOSCOPY    . PORT-A-CATH REMOVAL N/A 02/16/2016   Procedure: REMOVAL PORT-A-CATH;  Surgeon: Donnie Mesa, MD;  Location: [facility];  Service: General;  Laterality: N/A;  . PORTACATH PLACEMENT Left 08/18/14  . PORTACATH PLACEMENT Left 08/18/2014   Procedure: LEFT SUBCLAVIAN VEIN PORT PLACEMENT;  Surgeon: Donnie Mesa, MD;  Location: [facility];  Service: General;  Laterality: Left;    There were no vitals filed for this visit.  Subjective Assessment - 07/31/19 1017    Subjective  She reports that she feels she is doing well. Denies any falls. Denies any near falls. Reports compliance with HEP daily and is consistently using foot up brace to date.    Pertinent History  dx MS 2002 with greates impairment in left lower extremity (L foot drop) and most recent severe exacerbation in 2016, HLD, hx of seizures (none since 2003)    How long can you walk comfortably?  I become unsteady/fatigued after being active/standing for > 45 min - 1 hour    Patient Stated Goals  Bioness, gain more strength in my left side    Currently in Pain?  No/denies  [facility] PT Assessment - 07/31/19 1020      Transfers   Five time sit to stand comments   12.69 seconds from standard height chair with no use of UEs and controlled descent       6 Minute Walk- Baseline   6 Minute Walk- Baseline  yes    BP (mmHg)  (!) 88/64    HR (bpm)  83    02 Sat (%RA)  98 %    Modified Borg Scale for Dyspnea  0- Nothing at all    Perceived Rate of Exertion (Borg)  6-      6 Minute walk- Post Test   6 Minute Walk Post Test  yes    BP (mmHg)  (!) 74/51   multile error readings when attempting to take BP   HR (bpm)  92    02 Sat (%RA)  98 %    Modified Borg Scale for Dyspnea  3- Moderate shortness of breath or breathing difficulty    Perceived Rate of Exertion (Borg)  7- Very, very light       6 minute walk test results    Aerobic Endurance Distance Walked  1258   initially 1221   Endurance additional comments  no AD, no rest breaks utilized, mod I with ambulation is all that was required (was supervised by PT due to the nature of this standardized test); of note patient's post-test BP reading took several times for BP machine to work, and readings (when not errored out) where atypically low. Patinet reports she typically has low readings, but not as low as the machine read. Patient was not symptomatic at all, which is why PT belives this is a faulty reading.      Functional Gait  Assessment   Gait assessed   Yes    Gait Level Surface  Walks 20 ft in less than 7 sec but greater than 5.5 sec, uses assistive device, slower speed, mild gait deviations, or deviates 6-10 in outside of the 12 in walkway width.    Change in Gait Speed  Able to smoothly change walking speed without loss of balance or gait deviation. Deviate no more than 6 in outside of the 12 in walkway width.    Gait with Horizontal Head Turns  Performs head turns smoothly with no change in gait. Deviates no more than 6 in outside 12 in walkway width    Gait with Vertical Head Turns  Performs head turns with no change in gait. Deviates no more than 6 in outside 12 in walkway width.    Gait and Pivot Turn  Pivot turns safely within 3 sec and stops quickly with no loss of balance.    Step Over Obstacle  Is able to step over 2 stacked shoe boxes taped together (9 in total height) without changing gait speed. No evidence of imbalance.    Gait with Narrow Base of Support  Ambulates less than 4 steps heel to toe or cannot perform without assistance.    Gait with Eyes Closed  Walks 20 ft, uses assistive device, slower speed, mild gait deviations, deviates 6-10 in outside 12 in walkway width. Ambulates 20 ft in less than 9 sec but greater than 7 sec.    Ambulating Backwards  Walks 20 ft, no assistive devices, good speed, no evidence  for imbalance, normal gait    Steps  Alternating feet, must use rail.    Total Score  24  [facility] Adult PT Treatment/Exercise - 07/31/19 1020      Transfers   Transfers  Sit to Stand;Stand to Sit    Sit to Stand  6: Modified independent (Device/Increase time)    Stand to Sit  6: Modified independent (Device/Increase time);5: Supervision      Ambulation/Gait   Ambulation/Gait  Yes    Ambulation/Gait Assistance  6: Modified independent (Device/Increase time)    Ambulation/Gait Assistance Details  With foot up brace donned, mod I on all ambulation surfaces    Ambulation Distance (Feet)  1258 Feet   1258 x 1, 300 x 1, 115 x 3    Assistive device  None;Other (Comment)   foot up brace   Gait Pattern  Step-through pattern;Decreased stride length;Decreased arm swing - right;Decreased arm swing - left    Ambulation Surface  Level;Unlevel;Indoor;Outdoor;Paved;Grass    Stairs  Yes    Stairs Assistance  6: Modified independent (Device/Increase time)    Stairs Assistance Details (indicate cue type and reason)  mod I to ascend/descend with one railing with reciprocal gait; unable to perform reciprocal gait to descend without railing, but able to descend 4 steps with mod I with step to gait pattern    Stair Management Technique  One rail Right;Alternating pattern;Forwards    Number of Stairs  4   x 3 reps = 12 steps total   Height of Stairs  6    Gait Comments  Ambulated on indoor, outdoor, level and unlevel surfaces with foot up brace donned with mod I       Self-Care   Self-Care  Other Self-Care Comments    Other Self-Care Comments   Patient's foot up brace for slip on shoes/goes around foot did come in - no issues reported. Resolution reached from issue from initial issue of [employer] shipping her the wrong brace.             PT Education - 07/31/19 1449    Education Details  reviewed ongoing HEP, use of foot up brace, and plan to discharge today     Person(s) Educated  Patient    Methods  Explanation    Comprehension  Verbalized understanding       PT Short Term Goals - 07/10/19 1149      PT SHORT TERM GOAL #1   Title  Patient will be independent with HEP to decrease fall risk and improve functional mobility. (ALL STGs DUE 07/12/19)    Baseline  07/10/19: met with current program    Status  Achieved    Target Date  07/12/19      PT SHORT TERM GOAL #2   Title  6 Minute Walk Test will be performed and LTG will be set accordingly to demonstrate improvement in endurance.    Baseline  06/19/19: 1221 feet    Status  Achieved      PT SHORT TERM GOAL #3   Title  Patient will perform 5 Time Sit to Stand Test in </= 12 seconds to demonstrate improvement in functional mobility and decrease in fall risk.    Baseline  07/10/19: 12.87 sec's no UE support from standard height surface    Status  Achieved      PT SHORT TERM GOAL #4   Title  Patient will score >/= 15/30 on the functional gait assessment to demonstrate a decrease in fall risk.    Baseline  07/10/19: 22/30 scored today    Status  Achieved      PT SHORT TERM  GOAL #5   Title  Bioness trial will be performed with patient and education provided to patient concerning findings and efficacy of use of this system for patient.    Baseline  07/10/19: has been trialed with pt having decreased tolerance. using Foot up brace with gait with on issues.    Status  Achieved        PT Long Term Goals - 07/31/19 1021      PT LONG TERM GOAL #1   Title  Patient will be independent with HEP to decrease fall risk and improve functional mobility. (ALL LTGs due 09/04/19)    Baseline  MET 07/31/19    Time  12    Period  Weeks    Status  Achieved      PT LONG TERM GOAL #2   Title  Patient will improve 6 Minute Walk Test score by >/= 250 feet to demonstrate improvement in her endurance.    Baseline  NOT MET 07/31/19: 4656 ft today (baseline score distance = 1221 ft). Markedly improved gait mechanics today  relaitve to baseline.    Time  12    Period  Weeks    Status  Not Met      PT LONG TERM GOAL #3   Title  Patient will score </= 9 seconds on the 5 time sit to stand test in order to demonstrate improvement in functional strength and decreased fall risk.    Baseline  NOT MET 07/31/19 12.69 seconds (baseline score = 14.88 seconds)    Time  12    Period  Weeks    Status  Not Met      PT LONG TERM GOAL #4   Title  Patient will score >/= 21/30 on the Functional Gait Assessment to demonstrate a decrease in fall risk.    Baseline  MET 07/31/19: FGA = 24/30 (baseline score = 12/30)    Time  12    Period  Weeks    Status  Achieved      PT LONG TERM GOAL #5   Title  Patient will demonstrate ability to ambulate >/= 500 feet mod I with LRAD safely including indoor and outdoor surfaces and inclines to demonstrate safety with community based ambulation and ambulation to/from mother's home.    Baseline  MET 07/31/19 - see gait comments for details    Time  12    Period  Weeks    Status  Achieved      PT LONG TERM GOAL #6   Title  Patient will demonstrate ability to climb and descend 4 steps (3 reps - 12 steps total) with mod I and use of one railing to demonstrate safety when navigating stairs when at mother's home.    Baseline  MET 07/31/19 - see gait/stairs note for details    Time  12    Period  Weeks    Status  Achieved            Plan - 07/31/19 1451    Clinical Impression Statement  Today's skilled session focused on assessment of LTGs and discharge readiness. Patient has met 4 of 6 LTGs. Her two unmet goals are the 6 minute walk test (scored 1212f today compared to 1221 ft at baseline) and 5 time sit to stand test (12.69 seconds today compared to 14.88 seconds at baseline). Of importance with both of these test results/goals, patient's mechanics are markedly improved demonstrating eccentric control when descending on sit to stand test and improved  gait mechanics with use of foot up  brace and functional strength improvements during 6 minute walk test. Additionally, her functional gait assessment test score improved form 12/30 to 24/30 indicating she is at a medium risk for falls (cut off for "low risk" = scores of >/= 25/30). Her functional mobility and safety is markedly improved on all surfaces including inclines and outdoors. She has an extensive ongoing HEP fostering LE strengthen, balance improvement, and is now utilizing the foot up brace consistently with all types of shoes (patient now owns two types of foot up braces - one for lace up and one for strap/sandal shoes). In light of great progress made and goals met, PT and patient discussed and agree to discharge today.    Personal Factors and Comorbidities  Time since onset of injury/illness/exacerbation;Social Background    Examination-Activity Limitations  Lift;Squat;Locomotion Level;Stairs;Stand    Examination-Participation Restrictions  Yard Work;Cleaning;Laundry;Community Activity;Shop    Stability/Clinical Decision Making  Evolving/Moderate complexity    Rehab Potential  Good    PT Frequency  1x / week   PT recommends 2x/wk for 10 weeks, but patient requests 1x/wk due to busy schedule (moving soon) - if able to increase to recommended frequency will resubmit POC   PT Duration  12 weeks    PT Treatment/Interventions  ADLs/Self Care Home Management;Electrical Stimulation;DME Instruction;Gait training;Therapeutic exercise;Therapeutic activities;Functional mobility training;Stair training;Balance training;Neuromuscular re-education;Patient/family education;Manual techniques;Orthotic Fit/Training;Passive range of motion;Energy conservation;Aquatic Therapy    PT Next Visit Plan  discharge from OP neuro rehab today    Consulted and Agree with Plan of Care  Patient       Patient will benefit from skilled therapeutic intervention in order to improve the following deficits and impairments:  Abnormal gait, Decreased balance,  Decreased endurance, Decreased mobility, Difficulty walking, Decreased knowledge of precautions, Decreased range of motion, Impaired perceived functional ability, Improper body mechanics, Decreased activity tolerance, Decreased coordination, Decreased knowledge of use of DME, Decreased safety awareness, Decreased strength, Impaired flexibility  Visit Diagnosis: Muscle weakness (generalized)  Foot drop, left  Unsteadiness on feet  Other abnormalities of gait and mobility     Problem List Patient Active Problem List   Diagnosis Date Noted  . Left foot drop 05/25/2019  . Frequent UTI 11/25/2018  . Hyperlipidemia 10/29/2016  . Vitamin D deficiency 10/29/2016  . Numbness 04/19/2016  . Neck pain 10/18/2015  . Occipital neuralgia of left side 10/18/2015  . Polyneuropathy 04/04/2015  . Ataxic gait 12/24/2014  . Left carpal tunnel syndrome 09/27/2014  . Encounter for chemotherapy management 07/06/2014  . High risk medication use 05/14/2014  . Urinary incontinence 05/14/2014  . Depression with anxiety 05/06/2014  . Lower extremity weakness 04/28/2014  . Numbness of both lower extremities 04/28/2014  . Multiple falls 04/28/2014  . Nausea & vomiting 04/28/2014  . Relapsing remitting multiple sclerosis ([place]) 07/04/2012  . Memory loss 12/03/2011  . Multiple sclerosis ([place]) 09/14/2008  . Constipation 09/14/2008  . RUQ PAIN 09/14/2008    Juliann Pulse, PT, DPT  07/31/2019, 3:00 PM  [facility] [address] [place], [place], [address] Phone: [phone]   Fax:  [phone]  Name: DONIESHA LANDAU MRN: [id number] Date of Birth: January 16, 1976

## 2019-08-07 ENCOUNTER — Ambulatory Visit: Payer: Medicare Other | Admitting: Physical Therapy

## 2019-08-25 ENCOUNTER — Other Ambulatory Visit: Payer: Self-pay | Admitting: *Deleted

## 2019-08-25 MED ORDER — DESVENLAFAXINE SUCCINATE ER 100 MG PO TB24: ORAL_TABLET | ORAL | 5 refills | Status: DC

## 2019-09-06 ENCOUNTER — Ambulatory Visit (HOSPITAL_COMMUNITY)
Admission: EM | Admit: 2019-09-06 | Discharge: 2019-09-06 | Disposition: A | Discharge status: Home / Self Care | Payer: Medicare Other | Attending: Family Medicine | Admitting: Family Medicine

## 2019-09-06 ENCOUNTER — Other Ambulatory Visit: Payer: Self-pay

## 2019-09-06 ENCOUNTER — Encounter (HOSPITAL_COMMUNITY): Payer: Self-pay

## 2019-09-06 DIAGNOSIS — N390 Urinary tract infection, site not specified: Secondary | ICD-10-CM | POA: Diagnosis not present

## 2019-09-06 DIAGNOSIS — M549 Dorsalgia, unspecified: Secondary | ICD-10-CM

## 2019-09-06 LAB — POCT URINALYSIS DIP (DEVICE)
Glucose, UA: 250 mg/dL — AB
Hgb urine dipstick: NEGATIVE
Ketones, ur: 15 mg/dL — AB
Nitrite: POSITIVE — AB
Protein, ur: 100 mg/dL — AB
Specific Gravity, Urine: 1.015 (ref 1.005–1.030)
Urobilinogen, UA: 4 mg/dL — ABNORMAL HIGH (ref 0.0–1.0)
pH: 5 (ref 5.0–8.0)

## 2019-09-06 MED ORDER — CIPROFLOXACIN HCL 500 MG PO TABS: 500.0000 mg | ORAL_TABLET | Freq: Two times a day (BID) | ORAL | 0 refills | Status: DC

## 2019-09-06 MED ORDER — HYDROCODONE-ACETAMINOPHEN 7.5-325 MG PO TABS: 1.0000 | ORAL_TABLET | Freq: Four times a day (QID) | ORAL | 0 refills | Status: DC | PRN

## 2019-09-06 NOTE — ED Triage Notes (Signed)
Pt presents with lower back pain since yesterday. Denies dysuria, nausea, vomiting, fever. States having lower back pain every time she had an UTI.

## 2019-09-06 NOTE — Discharge Instructions (Addendum)
Take Cipro 2 times a day Plenty of fluid Take pain medication if needed Do not drive on the pain medication, it can cause drowsiness Call your doctor if not better in a couple of days I have sent a culture of your urine to the laboratory.  This test will have been available in 2 to 3 days.  You can check your culture report by my chart.  You will be called if there are any problems

## 2019-09-06 NOTE — ED Provider Notes (Signed)
[facility]    CSN: [id number] Arrival date & time: 09/06/19  1520      History   Chief Complaint Chief Complaint  Patient presents with  . Back Pain    HPI Ann Oconnor is a 44 y.o. female.   HPI  Patient states she has recurring UTIs She states that she usually does not know she has a UTI until she gets back pain She states that she has MS and it has altered her sensation in her pelvis She has not had any frequency, dysuria, hematuria She has low back pain that she states is "severe" No nausea No diarrhea No fever chills No vaginal discharge or bleeding  Past Medical History:  Diagnosis Date  . Anxiety   . Family history of adverse reaction to anesthesia    Father - difficulty awaken- has sleep apena  . Gait instability   . Head injury, closed, with brief LOC ([place]) 2003   fall  . Incontinence   . 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  Colpo normal with neg ECC  . Movement disorder   . MS (multiple sclerosis) ([place]) 2002  . Seizures ([place]) 2003   none since 2003  . UTI (lower urinary tract infection)   . Vision abnormalities     Patient Active Problem List   Diagnosis Date Noted  . Left foot drop 05/25/2019  . Frequent UTI 11/25/2018  . Hyperlipidemia 10/29/2016  . Vitamin D deficiency 10/29/2016  . Numbness 04/19/2016  . Neck pain 10/18/2015  . Occipital neuralgia of left side 10/18/2015  . Polyneuropathy 04/04/2015  . Ataxic gait 12/24/2014  . Left carpal tunnel syndrome 09/27/2014  . Encounter for chemotherapy management 07/06/2014  . High risk medication use 05/14/2014  . Urinary incontinence 05/14/2014  . Depression with anxiety 05/06/2014  . Lower extremity weakness 04/28/2014  . Numbness of both lower extremities 04/28/2014  . Multiple falls 04/28/2014  . Nausea & vomiting 04/28/2014  . Relapsing remitting multiple sclerosis ([place]) 07/04/2012  . Memory loss 12/03/2011    . Multiple sclerosis ([place]) 09/14/2008  . Constipation 09/14/2008  . RUQ PAIN 09/14/2008    Past Surgical History:  Procedure Laterality Date  . CERVICAL CONE BIOPSY  2008   CIN 2  . COLONOSCOPY    . PORT-A-CATH REMOVAL N/A 02/16/2016   Procedure: REMOVAL PORT-A-CATH;  Surgeon: Donnie Mesa, MD;  Location: [facility];  Service: General;  Laterality: N/A;  . PORTACATH PLACEMENT Left 08/18/14  . PORTACATH PLACEMENT Left 08/18/2014   Procedure: LEFT SUBCLAVIAN VEIN PORT PLACEMENT;  Surgeon: Donnie Mesa, MD;  Location: [facility];  Service: General;  Laterality: Left;    OB History    Gravida  0   Para      Term      Preterm      AB      Living        SAB      TAB      Ectopic      Multiple      Live Births               Home Medications    Prior to Admission medications   Medication Sig Start Date End Date Taking? Authorizing Provider  phenazopyridine (PYRIDIUM) 95 MG tablet Take 95 mg by mouth 3 (three) times daily as needed for pain.   Yes [provider]  acetaminophen (TYLENOL) 500 MG tablet Take 1,500  mg by mouth daily as needed for moderate pain or headache.    [provider]  Cholecalciferol (VITAMIN D PO) Take 5,000 Units by mouth daily.     [provider]  ciprofloxacin (CIPRO) 500 MG tablet Take 1 tablet (500 mg total) by mouth 2 (two) times daily. 09/06/19   Raylene Everts, MD  desvenlafaxine (PRISTIQ) 100 MG 24 hr tablet TAKE 1 TABLET(100 MG) BY MOUTH DAILY 08/25/19   Sater, Nanine Means, MD  gabapentin (NEURONTIN) 400 MG capsule TAKE 1 CAPSULE(400 MG) BY MOUTH TWICE DAILY 12/22/18   Sater, Nanine Means, MD  HYDROcodone-acetaminophen (NORCO) 7.5-325 MG tablet Take 1 tablet by mouth every 6 (six) hours as needed for moderate pain. 09/06/19   Raylene Everts, MD  ocrelizumab 600 mg in sodium chloride 0.9 % 500 mL Inject 600 mg into the vein every 6 (six) months.    [provider]    Family History Family History  Problem  Relation Age of Onset  . Thyroid disease Mother   . Heart disease Father   . Mitral valve prolapse Father   . Cancer Father        lung/brain- tumors  . Thyroid disease Sister   . Cancer Paternal Aunt        not sure what kind, maybe cervix  . Thyroid disease Sister   . Breast cancer Neg Hx     Social History Social History   Tobacco Use  . Smoking status: Never Smoker  . Smokeless tobacco: Never Used  Vaping Use  . Vaping Use: Never used  Substance Use Topics  . Alcohol use: No  . Drug use: No     Allergies   Patient has no known allergies.   Review of Systems Review of Systems see HPI Physical Exam Triage Vital Signs ED Triage Vitals  Enc Vitals Group     BP 09/06/19 1554 98/62     Pulse Rate 09/06/19 1554 80     Resp 09/06/19 1554 16     Temp 09/06/19 1554 98.3 F (36.8 C)     Temp Source 09/06/19 1554 Oral     SpO2 09/06/19 1554 98 %     Weight --      Height --      Head Circumference --      Peak Flow --      Pain Score 09/06/19 1618 4     Pain Loc --      Pain Edu? --      Excl. in [place]? --    No data found.  Updated Vital Signs BP 98/62 (BP Location: Left Arm)   Pulse 80   Temp 98.3 F (36.8 C) (Oral)   Resp 16   LMP 07/18/2017   SpO2 98%     Physical Exam Constitutional:      General: She is not in acute distress.    Appearance: She is well-developed. She is not toxic-appearing.     Comments: Pleasant.  Overweight  HENT:     Head: Normocephalic and atraumatic.     Nose:     Comments: Mask in place Eyes:     Conjunctiva/sclera: Conjunctivae normal.     Pupils: Pupils are equal, round, and reactive to light.  Cardiovascular:     Rate and Rhythm: Normal rate.  Pulmonary:     Effort: Pulmonary effort is normal. No respiratory distress.  Abdominal:     General: There is no distension.     Palpations:  Abdomen is soft.     Tenderness: There is no abdominal tenderness. There is no right CVA tenderness or left CVA tenderness.    Musculoskeletal:        General: Normal range of motion.     Cervical back: Normal range of motion.     Comments: Back nontender  Skin:    General: Skin is warm and dry.  Neurological:     Mental Status: She is alert.  Psychiatric:        Mood and Affect: Mood normal.        Behavior: Behavior normal.      UC Treatments / Results  Labs (all labs ordered are listed, but only abnormal results are displayed) Labs Reviewed  POCT URINALYSIS DIP (DEVICE) - Abnormal; Notable for the following components:      Result Value   Glucose, UA 250 (*)    Bilirubin Urine SMALL (*)    Ketones, ur 15 (*)    Protein, ur 100 (*)    Urobilinogen, UA 4.0 (*)    Nitrite POSITIVE (*)    Leukocytes,Ua LARGE (*)    All other components within normal limits  URINE CULTURE    EKG   Radiology No results found.  Procedures Procedures (including critical care time)  Medications Ordered in UC Medications - No data to display  Initial Impression / Assessment and Plan / UC Course  I have reviewed the triage vital signs and the nursing notes.  Pertinent labs & imaging results that were available during my care of the patient were reviewed by me and considered in my medical decision making (see chart for details).      Final Clinical Impressions(s) / UC Diagnoses   Final diagnoses:  Lower urinary tract infectious disease     Discharge Instructions     Take Cipro 2 times a day Plenty of fluid Take pain medication if needed Do not drive on the pain medication, it can cause drowsiness Call your doctor if not better in a couple of days I have sent a culture of your urine to the laboratory.  This test will have been available in 2 to 3 days.  You can check your culture report by my chart.  You will be called if there are any problems    ED Prescriptions    Medication Sig Dispense Auth. Provider   ciprofloxacin (CIPRO) 500 MG tablet Take 1 tablet (500 mg total) by mouth 2 (two) times  daily. 14 tablet Raylene Everts, MD   HYDROcodone-acetaminophen [facility]) 7.5-325 MG tablet Take 1 tablet by mouth every 6 (six) hours as needed for moderate pain. 15 tablet Raylene Everts, MD     I have reviewed the PDMP during this encounter.   Raylene Everts, MD 09/06/19 [phone]

## 2019-09-07 LAB — URINE CULTURE

## 2019-09-23 ENCOUNTER — Ambulatory Visit (INDEPENDENT_AMBULATORY_CARE_PROVIDER_SITE_OTHER): Payer: Medicare Other | Admitting: Nurse Practitioner

## 2019-09-23 ENCOUNTER — Encounter: Payer: Self-pay | Admitting: Nurse Practitioner

## 2019-09-23 ENCOUNTER — Other Ambulatory Visit: Payer: Self-pay

## 2019-09-23 VITALS — BP 122/76 | Ht 64.0 in | Wt 192.0 lb

## 2019-09-23 DIAGNOSIS — Z1151 Encounter for screening for human papillomavirus (HPV): Secondary | ICD-10-CM | POA: Diagnosis not present

## 2019-09-23 DIAGNOSIS — R87612 Low grade squamous intraepithelial lesion on cytologic smear of cervix (LGSIL): Secondary | ICD-10-CM | POA: Diagnosis not present

## 2019-09-23 DIAGNOSIS — Z01419 Encounter for gynecological examination (general) (routine) without abnormal findings: Secondary | ICD-10-CM | POA: Diagnosis not present

## 2019-09-23 DIAGNOSIS — Z780 Asymptomatic menopausal state: Secondary | ICD-10-CM

## 2019-09-23 LAB — COMPREHENSIVE METABOLIC PANEL
AG Ratio: 2 (calc) (ref 1.0–2.5)
ALT: 7 U/L (ref 6–29)
AST: 14 U/L (ref 10–35)
Albumin: 4.3 g/dL (ref 3.6–5.1)
Alkaline phosphatase (APISO): 57 U/L (ref 31–125)
BUN: 15 mg/dL (ref 7–25)
CO2: 28 mmol/L (ref 20–32)
Calcium: 9.2 mg/dL (ref 8.6–10.2)
Chloride: 104 mmol/L (ref 98–110)
Creat: 0.82 mg/dL (ref 0.50–1.10)
Globulin: 2.2 g/dL (calc) (ref 1.9–3.7)
Glucose, Bld: 84 mg/dL (ref 65–99)
Potassium: 4.3 mmol/L (ref 3.5–5.3)
Sodium: 140 mmol/L (ref 135–146)
Total Bilirubin: 0.4 mg/dL (ref 0.2–1.2)
Total Protein: 6.5 g/dL (ref 6.1–8.1)

## 2019-09-23 NOTE — Progress Notes (Signed)
   Medina 29-Nov-1975 [id number]   History:  44 y.o. G0 presents for breast and pelvic exam without GYN complaints. 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 2010 endometrial biopsy removed. History of vitamin D deficiency, HLD, MS. Early menopause at age 52 due to MS medication she was on and cycles did not return after discontinuing. Elevated FSH at that time. Denies menopausal symptoms. Not sexually active.   Gynecologic History Patient's last menstrual period was 07/19/2014.   Contraception: post menopausal status Last Pap: 09/22/2018. Results were: LGSIL negative high risk HPV Last mammogram: 11/14/2018. Results were: normal Last colonoscopy: 09/15/2008. Results were: normal   Past medical history, past surgical history, family history and social history were all reviewed and documented in the EPIC chart.  ROS:  A ROS was performed and pertinent positives and negatives are included.  Exam:  Vitals:   09/23/19 1057  BP: 122/76  Weight: 192 lb (87.1 kg)  Height: 5\' 4"  (1.626 m)   Body mass index is 32.96 kg/m.  General appearance:  Normal Thyroid:  Symmetrical, normal in size, without palpable masses or nodularity. Respiratory  Auscultation:  Clear without wheezing or rhonchi Cardiovascular  Auscultation:  Regular rate, without rubs, murmurs or gallops  Edema/varicosities:  Not grossly evident Abdominal  Soft,nontender, without masses, guarding or rebound.  Liver/spleen:  No organomegaly noted  Hernia:  None appreciated  Skin  Inspection:  Grossly normal   Breasts: Examined lying and sitting.   Right: Without masses, retractions, discharge or axillary adenopathy.   Left: Without masses, retractions, discharge or axillary adenopathy. Gentitourinary   Inguinal/mons:  Normal without inguinal adenopathy  External genitalia:  Normal  BUS/Urethra/Skene's glands:  Normal  Vagina:   Normal  Cervix:  Normal  Uterus:  Anteverted, normal in size, shape and contour.  Midline and mobile  Adnexa/parametria:     Rt: Without masses or tenderness.   Lt: Without masses or tenderness.  Anus and perineum: Normal   Assessment/Plan:  44 y.o. G for breast and pelvic exam.  Well female exam with routine gynecological exam - Plan: Comprehensive metabolic panel. Education provided on SBEs, importance of preventative screenings, current guidelines, high calcium diet, regular exercise, and multivitamin daily. Pap with high risk HPV today.  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 Repeat pap today.   Postmenopausal - medicated-induced. No menopausal symptoms  Follow up in 1 year for annual       Palmer, 11:18 AM 09/23/2019

## 2019-09-23 NOTE — Patient Instructions (Signed)
Health Maintenance, Female Adopting a healthy lifestyle and getting preventive care are important in promoting health and wellness. Ask your health care provider about:  The right schedule for you to have regular tests and exams.  Things you can do on your own to prevent diseases and keep yourself healthy. What should I know about diet, weight, and exercise? Eat a healthy diet   Eat a diet that includes plenty of vegetables, fruits, low-fat dairy products, and lean protein.  Do not eat a lot of foods that are high in solid fats, added sugars, or sodium. Maintain a healthy weight Body mass index (BMI) is used to identify weight problems. It estimates body fat based on height and weight. Your health care provider can help determine your BMI and help you achieve or maintain a healthy weight. Get regular exercise Get regular exercise. This is one of the most important things you can do for your health. Most adults should:  Exercise for at least 150 minutes each week. The exercise should increase your heart rate and make you sweat (moderate-intensity exercise).  Do strengthening exercises at least twice a week. This is in addition to the moderate-intensity exercise.  Spend less time sitting. Even light physical activity can be beneficial. Watch cholesterol and blood lipids Have your blood tested for lipids and cholesterol at 45 years of age, then have this test every 5 years. Have your cholesterol levels checked more often if:  Your lipid or cholesterol levels are high.  You are older than 45 years of age.  You are at high risk for heart disease. What should I know about cancer screening? Depending on your health history and family history, you may need to have cancer screening at various ages. This may include screening for:  Breast cancer.  Cervical cancer.  Colorectal cancer.  Skin cancer.  Lung cancer. What should I know about heart disease, diabetes, and high blood  pressure? Blood pressure and heart disease  High blood pressure causes heart disease and increases the risk of stroke. This is more likely to develop in people who have high blood pressure readings, are of African descent, or are overweight.  Have your blood pressure checked: ? Every 3-5 years if you are 18-39 years of age. ? Every year if you are 40 years old or older. Diabetes Have regular diabetes screenings. This checks your fasting blood sugar level. Have the screening done:  Once every three years after age 40 if you are at a normal weight and have a low risk for diabetes.  More often and at a younger age if you are overweight or have a high risk for diabetes. What should I know about preventing infection? Hepatitis B If you have a higher risk for hepatitis B, you should be screened for this virus. Talk with your health care provider to find out if you are at risk for hepatitis B infection. Hepatitis C Testing is recommended for:  Everyone born from 1945 through 1965.  Anyone with known risk factors for hepatitis C. Sexually transmitted infections (STIs)  Get screened for STIs, including gonorrhea and chlamydia, if: ? You are sexually active and are younger than 45 years of age. ? You are older than 45 years of age and your health care provider tells you that you are at risk for this type of infection. ? Your sexual activity has changed since you were last screened, and you are at increased risk for chlamydia or gonorrhea. Ask your health care provider if   you are at risk.  Ask your health care provider about whether you are at high risk for HIV. Your health care provider may recommend a prescription medicine to help prevent HIV infection. If you choose to take medicine to prevent HIV, you should first get tested for HIV. You should then be tested every 3 months for as long as you are taking the medicine. Pregnancy  If you are about to stop having your period (premenopausal) and  you may become pregnant, seek counseling before you get pregnant.  Take 400 to 800 micrograms (mcg) of folic acid every day if you become pregnant.  Ask for birth control (contraception) if you want to prevent pregnancy. Osteoporosis and menopause Osteoporosis is a disease in which the bones lose minerals and strength with aging. This can result in bone fractures. If you are 65 years old or older, or if you are at risk for osteoporosis and fractures, ask your health care provider if you should:  Be screened for bone loss.  Take a calcium or vitamin D supplement to lower your risk of fractures.  Be given hormone replacement therapy (HRT) to treat symptoms of menopause. Follow these instructions at home: Lifestyle  Do not use any products that contain nicotine or tobacco, such as cigarettes, e-cigarettes, and chewing tobacco. If you need help quitting, ask your health care provider.  Do not use street drugs.  Do not share needles.  Ask your health care provider for help if you need support or information about quitting drugs. Alcohol use  Do not drink alcohol if: ? Your health care provider tells you not to drink. ? You are pregnant, may be pregnant, or are planning to become pregnant.  If you drink alcohol: ? Limit how much you use to 0-1 drink a day. ? Limit intake if you are breastfeeding.  Be aware of how much alcohol is in your drink. In the [place] one drink equals one 12 oz bottle of beer (355 mL), one 5 oz glass of wine (148 mL), or one 1 oz glass of hard liquor (44 mL). General instructions  Schedule regular health, dental, and eye exams.  Stay current with your vaccines.  Tell your health care provider if: ? You often feel depressed. ? You have ever been abused or do not feel safe at home. Summary  Adopting a healthy lifestyle and getting preventive care are important in promoting health and wellness.  Follow your health care provider's instructions about healthy  diet, exercising, and getting tested or screened for diseases.  Follow your health care provider's instructions on monitoring your cholesterol and blood pressure. This information is not intended to replace advice given to you by your health care provider. Make sure you discuss any questions you have with your health care provider. Document Revised: 02/12/2018 Document Reviewed: 02/12/2018 Elsevier Patient Education  2020 [employer]  

## 2019-09-23 NOTE — Addendum Note (Signed)
Addended by: Nelva Nay on: 09/23/2019 11:27 AM   Modules accepted: Orders

## 2019-09-24 LAB — PAP, TP IMAGING W/ HPV RNA, RFLX HPV TYPE 16,18/45: HPV DNA High Risk: NOT DETECTED

## 2019-09-29 NOTE — Telephone Encounter (Signed)
Patient received result note from [place], NP.   "Ann Oconnor, Your pap came back showing low grade cellular changes. The recommendation is to perform another colposcopy for confirmation that this is the correct diagnosis and that is not not more progressive than what the pap tells [place]. I know you have had these low grade changes since 2017 and 2 colposcopies/biopsies for confirmation. If LGSIL continues to be persistent we can consider a LEEP procedure like the one you had in the past. This is not typically done for low grade changes but we understand if you are tired of the follow ups. This is something you can discuss with Dr. Delilah Shan at your colposcopy appointment. If not we will continue with annual screenings and colposcopies as needed. Reach out if you have any questions.  Tiffany"

## 2019-10-01 ENCOUNTER — Other Ambulatory Visit: Payer: Self-pay | Admitting: Nurse Practitioner

## 2019-10-01 ENCOUNTER — Other Ambulatory Visit: Payer: Self-pay

## 2019-10-01 DIAGNOSIS — Z1231 Encounter for screening mammogram for malignant neoplasm of breast: Secondary | ICD-10-CM

## 2019-11-16 ENCOUNTER — Ambulatory Visit
Admission: RE | Admit: 2019-11-16 | Discharge: 2019-11-16 | Disposition: A | Discharge status: Home / Self Care | Payer: Medicare Other | Source: Ambulatory Visit | Attending: Nurse Practitioner | Admitting: Nurse Practitioner

## 2019-11-16 ENCOUNTER — Other Ambulatory Visit: Payer: Self-pay

## 2019-11-16 DIAGNOSIS — Z1231 Encounter for screening mammogram for malignant neoplasm of breast: Secondary | ICD-10-CM

## 2019-12-16 ENCOUNTER — Other Ambulatory Visit: Payer: Self-pay | Admitting: Neurology

## 2020-01-18 ENCOUNTER — Other Ambulatory Visit: Payer: Self-pay | Admitting: Neurology

## 2020-02-02 ENCOUNTER — Ambulatory Visit: Payer: Medicare Other | Admitting: Family Medicine

## 2020-02-02 ENCOUNTER — Encounter: Payer: Self-pay | Admitting: Family Medicine

## 2020-02-02 VITALS — BP 100/69 | HR 76 | Ht 64.0 in

## 2020-02-02 DIAGNOSIS — N390 Urinary tract infection, site not specified: Secondary | ICD-10-CM

## 2020-02-02 DIAGNOSIS — M21372 Foot drop, left foot: Secondary | ICD-10-CM

## 2020-02-02 DIAGNOSIS — G35 Multiple sclerosis: Secondary | ICD-10-CM | POA: Diagnosis not present

## 2020-02-02 DIAGNOSIS — R260 Ataxic gait: Secondary | ICD-10-CM | POA: Diagnosis not present

## 2020-02-02 DIAGNOSIS — R200 Anesthesia of skin: Secondary | ICD-10-CM

## 2020-02-02 DIAGNOSIS — Z79899 Other long term (current) drug therapy: Secondary | ICD-10-CM

## 2020-02-02 DIAGNOSIS — G35A Relapsing-remitting multiple sclerosis: Secondary | ICD-10-CM

## 2020-02-02 DIAGNOSIS — E559 Vitamin D deficiency, unspecified: Secondary | ICD-10-CM

## 2020-02-02 MED ORDER — GABAPENTIN 400 MG PO CAPS: ORAL_CAPSULE | ORAL | 3 refills | Status: DC

## 2020-02-02 MED ORDER — DESVENLAFAXINE SUCCINATE ER 100 MG PO TB24: ORAL_TABLET | ORAL | 3 refills | Status: DC

## 2020-02-02 NOTE — Patient Instructions (Signed)
Below is our plan:  We will continue current treatment plan. I will update labs today. Next infusion on 12/16.   Please make sure you are staying well hydrated. I recommend 50-60 ounces daily. Well balanced diet and regular exercise encouraged.    Please continue follow up with care team as directed.   Follow up with [place] in 6 months   You may receive a survey regarding today's visit. I encourage you to leave honest feed back as I do use this information to improve patient care. Thank you for seeing me today!      Multiple Sclerosis Multiple sclerosis (MS) is a disease of the brain, spinal cord, and optic nerves (central nervous system). It causes the body's disease-fighting (immune) system to destroy the protective covering (myelin sheath) around nerves in the brain. When this happens, signals (nerve impulses) going to and from the brain and spinal cord do not get sent properly or may not get sent at all. There are several types of MS:  Relapsing-remitting MS. This is the most common type. This causes sudden attacks of symptoms. After an attack, you may recover completely until the next attack, or some symptoms may remain permanently.  Secondary progressive MS. This usually develops after the onset of relapsing-remitting MS. Similar to relapsing-remitting MS, this type also causes sudden attacks of symptoms. Attacks may be less frequent, but symptoms slowly get worse (progress) over time.  Primary progressive MS. This causes symptoms that steadily progress over time. This type of MS does not cause sudden attacks of symptoms. The age of onset of MS varies, but it often develops between 27-69 years of age. MS is a lifelong (chronic) condition. There is no cure, but treatment can help slow down the progression of the disease. What are the causes? The cause of this condition is not known. What increases the risk? You are more likely to develop this condition if:  You are a  woman.  You have a relative with MS. However, the condition is not passed from parent to child (inherited).  You have a lack (deficiency) of vitamin D.  You smoke. MS is more common in the [place] than in the [place]. What are the signs or symptoms? Relapsing-remitting and secondary progressive MS cause symptoms to occur in episodes or attacks that may last weeks to months. There may be long periods between attacks in which there are almost no symptoms. Primary progressive MS causes symptoms to steadily progress after they develop. Symptoms of MS vary because of the many different ways it affects the central nervous system. The main symptoms include:  Vision problems and eye pain.  Numbness.  Weakness.  Inability to move your arms, hands, feet, or legs (paralysis).  Balance problems.  Shaking that you cannot control (tremors).  Muscle spasms.  Problems with thinking (cognitive changes). MS can also cause symptoms that are associated with the disease, but are not always the direct result of an MS attack. They may include:  Inability to control urination or bowel movements (incontinence).  Headaches.  Fatigue.  Inability to tolerate heat.  Emotional changes.  Depression.  Pain. How is this diagnosed? This condition is diagnosed based on:  Your symptoms.  A neurological exam. This involves checking central nervous system function, such as nerve function, reflexes, and coordination.  MRIs of the brain and spinal cord.  Lab tests, including a lumbar puncture that tests the fluid that surrounds the brain and spinal cord (cerebrospinal fluid).  Tests  to measure the electrical activity of the brain in response to stimulation (evoked potentials). How is this treated? There is no cure for MS, but medicines can help decrease the number and frequency of attacks and help relieve nuisance symptoms. Treatment options may include:  Medicines that  reduce the frequency of attacks. These medicines may be given by injection, by mouth (orally), or through an IV.  Medicines that reduce inflammation (steroids). These may provide short-term relief of symptoms.  Medicines to help control pain, depression, fatigue, or incontinence.  Vitamin D, if you have a deficiency.  Using devices to help you move around (assistive devices), such as braces, a cane, or a walker.  Physical therapy to strengthen and stretch your muscles.  Occupational therapy to help you with everyday tasks.  Alternative or complementary treatments such as exercise, massage, or acupuncture. Follow these instructions at home:  Take over-the-counter and prescription medicines only as told by your health care provider.  Do not drive or use heavy machinery while taking prescription pain medicine.  Use assistive devices as recommended by your physical therapist or your health care provider.  Exercise as directed by your health care provider.  Return to your normal activities as told by your health care provider. Ask your health care provider what activities are safe for you.  Reach out for support. Share your feelings with friends, family, or a support group.  Keep all follow-up visits as told by your health care provider and therapists. This is important. Where to find more information  National Multiple Sclerosis Society: https://www.nationalmssociety.org Contact a health care provider if:  You feel depressed.  You develop new pain or numbness.  You have tremors.  You have problems with sexual function. Get help right away if:  You develop paralysis.  You develop numbness.  You have problems with your bladder or bowel function.  You develop double vision.  You lose vision in one or both eyes.  You develop suicidal thoughts.  You develop severe confusion. If you ever feel like you may hurt yourself or others, or have thoughts about taking your own  life, get help right away. You can go to your nearest emergency department or call:  Your local emergency services (911 in the [place]  A suicide crisis helpline, such as the [place] at (802) 114-7166. This is open 24 hours a day. Summary  Multiple sclerosis (MS) is a disease of the central nervous system that causes the body's immune system to destroy the protective covering (myelin sheath) around nerves in the brain.  There are 3 types of MS: relapsing-remitting, secondary progressive, and primary progressive. Relapsing-remitting and secondary progressive MS cause symptoms to occur in episodes or attacks that may last weeks to months. Primary progressive MS causes symptoms to steadily progress after they develop.  There is no cure for MS, but medicines can help decrease the number and frequency of attacks and help relieve nuisance symptoms. Treatment may also include physical or occupational therapy.  If you develop numbness, paralysis, vision problems, or other neurological symptoms, get help right away. This information is not intended to replace advice given to you by your health care provider. Make sure you discuss any questions you have with your health care provider. Document Revised: 02/01/2017 Document Reviewed: 04/30/2016 Elsevier Patient Education  2020 [employer].

## 2020-02-02 NOTE — Progress Notes (Signed)
I have read the note, and I agree with the clinical assessment and plan.  Sheyann Sulton A. Clementine Soulliere, MD, PhD, FAAN Certified in Neurology, Clinical Neurophysiology, Sleep Medicine, Pain Medicine and Neuroimaging  [facility] [address], [address] , [place] [address] [phone]  

## 2020-02-02 NOTE — Progress Notes (Signed)
Chief Complaint  Patient presents with  . Follow-up    rm 16  . Multiple Sclerosis     HISTORY OF PRESENT ILLNESS: Today 02/02/20  Ann Oconnor is a 44 y.o. female here today for follow up for aggressive RRMS. She continues Ocrevus every 8 months due to low antibodies. IgM 14, IgA 56. MRI stable in 12/2018. Last infusion 06/2019. Next infusion 12/16.   She feels that symptoms are stable. No new or exacerbating symptoms. She has moved since last being seen and denies any worsening symptoms with increased stress. She is settling in now.   She completed 8 weeks of physical therapy over the summer. She feels that this helped with her balance. She does continued to drag left foot when she is tired or walking for longer periods. No assistive devices. No falls.   She continues gabapentin for generalized pain and numbness. She is tolerating it well. She is sleeping well. Mood is stbale on Pristiq.   She continues vitamin D 5000iu daily.    HISTORY (copied from previous note)  Ann Oconnor is a 44 y.o. woman with aggressive relapsing remitting MS who was diagnosed in 2002.   She had a very severe series of exacerbations in 2016 after stopping Gilenya.     Update 05/25/2019: She has been on Ocrevus --- her last infusion was July 2020.   Her next infusion will be next week.   We stretched to every 8 months due to low IgM and IgA (IgG ws ok).  She asked about ofatumumab we discussed it is very similar to ocrelizumab though is self injectable.  We would still have to be concerned about low antibody levels.  She feels her gait is doing a little worse, especially the last month and she feels her balance is off.  Her last MRi was 12/13/2018.   Her left leg is worse than the right.    She has a foot drop that worsens with longer distances.   She wanted to discuss the Bioness device.   Arm strength is good.  She notes no difficulties with her vision.  She has some fatigue but feels this is  stable.  She sleeps well most nights.  Mood and cognition is doing well.  Update 11/25/2018: She has been on Ocrevus x 3 years, last infusion ws July 2020.    Her IgG and IgM have been low and we discussed stretching the Ocrevus out to every 8 months.     No URI but had bad UTI a few months ago.  She had chills with shaking but did not have a fever.   She has had a few bad UTIs but does not note issues with urinary hesitancy or problems emptying.     Gait is doing well.  She notes some left leg weakness but it is stable and much better than a few years ago.    She has mild left leg numbness.  Vision is fine.  She has some fatigue.   She is sleeping well.   Notes no changes in cognition and mood is doing well.     Update 05/05/2018:  She has been on Ocrevus since 2017 and is tolerating it well.   The last infusion was 03/31/2018.  IgM has been low at 14 (26-217 normal) and IgA is mildly low at 59 (87 up normal) but these are stable.   No exacerbations or new symptoms.   She did not do the flu shot this  year and we discussed getting it.    Gait is ok and she does not use a cane.   When tired, she has some gait issues due to left > right leg weakness.   She exercises almost every day.  Her left leg is mildly spastic.       She has mild leg numbness.    Bladder is doing well.   Vision is fine.   She has no fatigue most days.    She sleeps well most nights.   Mood and cognition are doing well.    Update 10/30/2017: She is doing well.   She has been on Ocrevus for close to  Years and was on Novantrone before that for 4-5 cycles.     She denies any exacerbations.     She has no side effects form Ocrevus and has insomnia from the steroid x 1 night.     Labwork from earlier this year showed IgM was low at 14 (26-217) and IgG and CBC/diff were normal.   Her gait is doing well and she can climb stairs.     She denies numbness in her legs but has mild left sided weakness (old and much better than in 2016).    Bladder is doing well.  She denies much fatigue and is going to the gym daily.    She sleeps well most night.   Mood is doing well.    Cognition is fine.      Update 05/01/2017:  She had a small exacerbation in September 2018 with right leg weakness. She received a couple days of IV steroids and improved. She now feels that she is doing well. Her last (this infusion was just this month. She is tolerating it well. Before that she was on Novantrone and before that [place].  She is walking better now and could walk a mile without stopping.   She is not using her cane.    Balance is mildly wobbly at times but better than in September.       Vision is doing well.    Bladder is now doing well without any med's.    She is sleeping well at night and her fatigue is mild.      Mood is doing well and she is on Prozac.Marland Kitchen      She takes vitamin D supplements for her low vitamin D.  Update 11/13/2016:   Ann Oconnor was last seen 2 weeks ago. She was stable at that time on ocrelizumab.   Over the weekend, she noted more difficulty with gait.   She notes her right leg is mildly weaker and she is throwing the foot put as she walks.  Balance is mildly worse.   She started using her cane again.    She denies any falls. She denies any fevers or infections.   Bladder function is fine.   Vision is unchanged.   She has fatigue and amantadine was tried after the last visit.   She noted no improvement and stopped.    Adderall was not tolerated in the past.    She sleeps well at night.  At last visit we checked vitamin D. It was low and she is taking high-dose supplements and we'll switch to OTC supplements in a few months.  Update 10/29/2016:  She is a ocrelizumab. Her next infusion is later this week. She has not had any exacerbations while on ocrelizumab and she tolerates it well. Before that she was on Novantrone which  helped to stabilize her MS but she needed to switch due to the lifetime limited.  She feels gait has improved  further this year. Specifically she is walking well without a cane. She is able to do about 1 mile. She does need to look down at the ground or her feet when she walks for better balance and she did have 1 fall.    She continues to have numbness in both legs that has mildly improved over the last year. Bladder function is doing well and she has not had any incontinence since her last visit. She continues to experience fatigue that is physical more than cognitive. Adderall made her jittery so she stopped. She does not think she has ever been on amantadine.   __________________________________ From 04/19/2016: MS:    She switched from Novantrone to ocrelizumab in July 2017.     She tolerated the infusion well.  She had a total of 5 Novantrone treatments at 12 mg/M2.     She continues to have some deficits from her severe exacerbations early 2016.                                                                     Gait/strength:  She is able to walk much better and has walked a mie without a cane but that tires her out .   She regularly walks > 100 yards without rest.     She can climb a short flight of stairs.   She is driving now short distances.  She has mild weakness and spasticity in legs but not arms   Gait issues are due more to balance than strength.     Numbness:   She has bilateral fairly symmetric numbness in her arms and legs since the 2016 exaceration.    Numbness is not complete as it was but is still severe.   She had normal NCV.    Numbness is slightly tingling but not painful.   She is on gabapentin 400 mg twice a day with benefit   Bladder/bowel:   She reports no incontinence since last visit.     No significant frequency.    Vision:  She denies MS related visual issues.  Fatigue/sleep:  She has fatigue most days, worse if more active and worse in the afternoons.   Fatigue is more cognitive many days than physical.    She is able to sleep fairly well at night.  Mood/cognition:    Mood is much better than in 2016 and early 2017.  She has mild depression and anxiety, helped by Paxil.  Her cognitive issues have returned close to baseline.  She notes some verbal fluency issues.    MS:    She presented in 2002 with numbness in her legs and poor gait.  The MRI scan showed MS plaques in the spinal cord and the brain and she was diagnosed with multiple sclerosis. She was initially placed on Rebif. At first she did well with only a couple of small central exacerbations. However, 2003 she had a more severe exacerbation that affected her gait. She was placed on a combination Rebif and Copaxone for a short period of time and then was on Copaxone monotherapy. She switched to [place] in 2011  due to needle fatigue. She had numbness in her feet that would not go away and was switched to Tecfidera from [place] in November 2015.  In January 2016, she lost strength in both legs and urinary incontinence. She had a course of IV steroids without any benefit in late January. Because there was no benefit, she was admitted to [facility] for a course of plasmapheresis starting February 5. She got no benefit from the plasmapheresis. During the next week, she was home but she was doing worse and worse. She then presented to [facility]. She was admitted for another week of steroids. An MRI done at admission showed very aggressive MS changes.    Brain 04/28/2014 shows multiple infratentorial plaques including left greater than right middle cerebellar peduncles, bilateral pons, and cerebellum. Additionally there are multiple foci in the hemispheres, some with a concentric demyelinating pattern that could be consistent with Balo's concentric sclerosis, an especially fulminant form of multiple sclerosis. She was unable to complete the examination or get contrast due to the need to terminate the study early.   When compared to an MRI dated 02/07/2012, there has been dramatic change with multiple new brain stem and  hemispheric foci.  MRI of the cervical spine 10/26/2010. showed multiple plaques:  right at the C2 level, left aspect C2-3 level, right aspect C3 level, the posterior central aspect C3 level, upper C6 level slightly greater to the right and C7 level greater to the left. Abnormal signal within the left aspect of the cord at the T3- T4 level.   MRI of the brain in 05/19/2014 showed several additional posterior fossa lesions prompting initiation of Novantrone.      REVIEW OF SYSTEMS: Out of a complete 14 system review of symptoms, the patient complains only of the following symptoms, imbalance, anxiety/depression, numbness, left sided weakness and all other reviewed systems are negative.   ALLERGIES: No Known Allergies   HOME MEDICATIONS: Outpatient Medications Prior to Visit  Medication Sig Dispense Refill  . acetaminophen (TYLENOL) 500 MG tablet Take 1,500 mg by mouth daily as needed for moderate pain or headache.    . Cholecalciferol (VITAMIN D PO) Take 5,000 Units by mouth daily.     Marland Kitchen desvenlafaxine (PRISTIQ) 100 MG 24 hr tablet TAKE 1 TABLET(100 MG) BY MOUTH DAILY 30 tablet 5  . gabapentin (NEURONTIN) 400 MG capsule TAKE 1 CAPSULE(400 MG) BY MOUTH TWICE DAILY 60 capsule 0  . ocrelizumab 600 mg in sodium chloride 0.9 % 500 mL Inject 600 mg into the vein every 6 (six) months.     Facility-Administered Medications Prior to Visit  Medication Dose Route Frequency Provider Last Rate Last Admin  . gadopentetate dimeglumine (MAGNEVIST) injection 20 mL  20 mL Intravenous Once PRN Sater, Richard A, MD      . gadopentetate dimeglumine (MAGNEVIST) injection 20 mL  20 mL Intravenous Once PRN Sater, Nanine Means, MD         PAST MEDICAL HISTORY: Past Medical History:  Diagnosis Date  . Anxiety   . Family history of adverse reaction to anesthesia    Father - difficulty awaken- has sleep apena  . Gait instability   . Head injury, closed, with brief LOC ([place]) 2003   fall  . Incontinence   . LGSIL  (low grade squamous intraepithelial dysplasia) 03/2015   positive high risk HPV subtype 18/45.  Colpo normal with neg ECC  . Movement disorder   . MS (multiple sclerosis) ([place]) 2002  . Seizures ([place]) 2003  none since 2003  . UTI (lower urinary tract infection)   . Vision abnormalities      PAST SURGICAL HISTORY: Past Surgical History:  Procedure Laterality Date  . CERVICAL CONE BIOPSY  2008   CIN 2  . COLONOSCOPY    . PORT-A-CATH REMOVAL N/A 02/16/2016   Procedure: REMOVAL PORT-A-CATH;  Surgeon: Donnie Mesa, MD;  Location: [place];  Service: General;  Laterality: N/A;  . PORTACATH PLACEMENT Left 08/18/14  . PORTACATH PLACEMENT Left 08/18/2014   Procedure: LEFT SUBCLAVIAN VEIN PORT PLACEMENT;  Surgeon: Donnie Mesa, MD;  Location: [facility] OR;  Service: General;  Laterality: Left;     FAMILY HISTORY: Family History  Problem Relation Age of Onset  . Thyroid disease Mother   . Heart disease Father   . Mitral valve prolapse Father   . Cancer Father        lung/brain- tumors  . Thyroid disease Sister   . Cancer Paternal Aunt        not sure what kind, maybe cervix  . Thyroid disease Sister   . Breast cancer Neg Hx      SOCIAL HISTORY: Social History   Socioeconomic History  . Marital status: Single    Spouse name: Not on file  . Number of children: Not on file  . Years of education: Not on file  . Highest education level: Not on file  Occupational History  . Not on file  Tobacco Use  . Smoking status: Never Smoker  . Smokeless tobacco: Never Used  Vaping Use  . Vaping Use: Never used  Substance and Sexual Activity  . Alcohol use: No  . Drug use: No  . Sexual activity: Not Currently    Comment: 1st intercourse- 23, partners- 3,   Other Topics Concern  . Not on file  Social History Narrative  . Not on file   Social Determinants of Health   Financial Resource Strain:   . Difficulty of Paying Living Expenses: Not on file  Food Insecurity:   . Worried About  Charity fundraiser in the Last Year: Not on file  . Ran Out of Food in the Last Year: Not on file  Transportation Needs:   . Lack of Transportation (Medical): Not on file  . Lack of Transportation (Non-Medical): Not on file  Physical Activity:   . Days of Exercise per Week: Not on file  . Minutes of Exercise per Session: Not on file  Stress:   . Feeling of Stress : Not on file  Social Connections:   . Frequency of Communication with Friends and Family: Not on file  . Frequency of Social Gatherings with Friends and Family: Not on file  . Attends Religious Services: Not on file  . Active Member of Clubs or Organizations: Not on file  . Attends Archivist Meetings: Not on file  . Marital Status: Not on file  Intimate Partner Violence:   . Fear of Current or Ex-Partner: Not on file  . Emotionally Abused: Not on file  . Physically Abused: Not on file  . Sexually Abused: Not on file      PHYSICAL EXAM  Vitals:   02/02/20 1018  BP: 100/69  Pulse: 76  Height: 5\' 4"  (1.626 m)   Body mass index is 32.96 kg/m.   Generalized: Well developed, in no acute distress  Cardiology: normal rate and rhythm, no murmur auscultated  Respiratory: clear to auscultation bilaterally    Neurological examination  Mentation: Alert oriented  to time, place, history taking. Follows all commands speech and language fluent Cranial nerve II-XII: Pupils were equal round reactive to light. Extraocular movements were full, visual field were full on confrontational test. Facial sensation and strength were normal. Head turning and shoulder shrug  were normal and symmetric. Motor: The motor testing reveals 5 over 5 strength of all 4 extremities with exception of left hipflexion.  Sensory: Sensory testing is intact to soft touch on all 4 extremities. No evidence of extinction is noted.  Coordination: Cerebellar testing reveals good finger-nose-finger and heel-to-shin bilaterally.  Gait and station:  Gait is stable with no assistive device, no obvious footdrop noted today, tandem is unsteady.  Reflexes diminished in bilateral patellar but symmetric     DIAGNOSTIC DATA (LABS, IMAGING, TESTING) - I reviewed patient records, labs, notes, testing and imaging myself where available.  Lab Results  Component Value Date   WBC 3.7 05/25/2019   HGB 12.7 05/25/2019   HCT 37.1 05/25/2019   MCV 93 05/25/2019   PLT 325 05/25/2019      Component Value Date/Time   NA 140 09/23/2019 1118   NA 143 10/30/2016 0948   K 4.3 09/23/2019 1118   CL 104 09/23/2019 1118   CO2 28 09/23/2019 1118   GLUCOSE 84 09/23/2019 1118   BUN 15 09/23/2019 1118   BUN 6 10/30/2016 0948   CREATININE 0.82 09/23/2019 1118   CALCIUM 9.2 09/23/2019 1118   PROT 6.5 09/23/2019 1118   PROT 6.9 10/30/2016 0948   ALBUMIN 4.5 10/30/2016 0948   AST 14 09/23/2019 1118   ALT 7 09/23/2019 1118   ALKPHOS 82 10/30/2016 0948   BILITOT 0.4 09/23/2019 1118   BILITOT 0.3 10/30/2016 0948   GFRNONAA 109 10/30/2016 0948   GFRAA 125 10/30/2016 0948   Lab Results  Component Value Date   CHOL 143 10/30/2017   HDL 62 10/30/2017   LDLCALC 68 10/30/2017   TRIG 64 10/30/2017   CHOLHDL 2.3 10/30/2017   No results found for: HGBA1C Lab Results  Component Value Date   VITAMINB12 348 04/04/2015   Lab Results  Component Value Date   TSH 2.871 08/21/2011      ASSESSMENT AND PLAN  44 y.o. year old female  has a past medical history of Anxiety, Family history of adverse reaction to anesthesia, Gait instability, Head injury, closed, with brief LOC ([place]) (2003), Incontinence, LGSIL (low grade squamous intraepithelial dysplasia) (03/2015), Movement disorder, MS (multiple sclerosis) ([place]) (2002), Seizures ([place]) (2003), UTI (lower urinary tract infection), and Vision abnormalities. here with   Relapsing remitting multiple sclerosis ([place]) - Plan: IgG, IgA, IgM, CD19 and CD20, Flow Cytometry, CMP, CBC with Differential/Platelets, Vitamin  D, 25-hydroxy  High risk medication use - Plan: IgG, IgA, IgM, CD19 and CD20, Flow Cytometry, CMP, CBC with Differential/Platelets  Ataxic gait  Left foot drop  Vitamin D deficiency - Plan: Vitamin D, 25-hydroxy  Frequent UTI   Rochanda is doing well, today. We will continue current treatment plan. She has scheduled Ocrevus infusion on 12/16. I will update antibodies and CD19/CD20 today. She will continue healthy lifestyle habits. Fall precautions reviewed. She will follow up with Dr Felecia Shelling in 6-8 months, sooner if needed pending labs. She verbalizes understanding and agreement with this plan.    I spent 30 minutes of face-to-face and non-face-to-face time with patient.  This included previsit chart review, lab review, study review, order entry, electronic health record documentation, patient education.    Debbora Presto, MSN, FNP-C 02/02/2020, 10:40  AM  [facility] [address] [place], [place] [address] [phone]

## 2020-02-03 ENCOUNTER — Telehealth: Payer: Self-pay

## 2020-02-03 NOTE — Telephone Encounter (Signed)
Pt was contacted and notified of the msg below. Pt verbalized understanding and had no additional questions.

## 2020-02-03 NOTE — Telephone Encounter (Signed)
-----   Message from Debbora Presto, NP sent at 02/03/2020 10:03 AM EST ----- Labs look stable. IgA and IgM are still low but holding steady. Will continue current treatment plan.

## 2020-02-05 LAB — CBC WITH DIFFERENTIAL/PLATELET
Basophils Absolute: 0.1 10*3/uL (ref 0.0–0.2)
Basos: 1 %
EOS (ABSOLUTE): 0.2 10*3/uL (ref 0.0–0.4)
Eos: 4 %
Hematocrit: 37.9 % (ref 34.0–46.6)
Hemoglobin: 12.4 g/dL (ref 11.1–15.9)
Immature Grans (Abs): 0 10*3/uL (ref 0.0–0.1)
Immature Granulocytes: 0 %
Lymphocytes Absolute: 1.1 10*3/uL (ref 0.7–3.1)
Lymphs: 26 %
MCH: 31 pg (ref 26.6–33.0)
MCHC: 32.7 g/dL (ref 31.5–35.7)
MCV: 95 fL (ref 79–97)
Monocytes Absolute: 0.4 10*3/uL (ref 0.1–0.9)
Monocytes: 10 %
Neutrophils Absolute: 2.5 10*3/uL (ref 1.4–7.0)
Neutrophils: 59 %
Platelets: 363 10*3/uL (ref 150–450)
RBC: 4 x10E6/uL (ref 3.77–5.28)
RDW: 12.3 % (ref 11.7–15.4)
WBC: 4.2 10*3/uL (ref 3.4–10.8)

## 2020-02-05 LAB — COMPREHENSIVE METABOLIC PANEL
ALT: 9 IU/L (ref 0–32)
AST: 14 IU/L (ref 0–40)
Albumin/Globulin Ratio: 2 (ref 1.2–2.2)
Albumin: 4.3 g/dL (ref 3.8–4.8)
Alkaline Phosphatase: 84 IU/L (ref 44–121)
BUN/Creatinine Ratio: 13 (ref 9–23)
BUN: 10 mg/dL (ref 6–24)
Bilirubin Total: <0.2 mg/dL (ref 0.0–1.2)
CO2: 26 mmol/L (ref 20–29)
Calcium: 9.2 mg/dL (ref 8.7–10.2)
Chloride: 104 mmol/L (ref 96–106)
Creatinine, Ser: 0.77 mg/dL (ref 0.57–1.00)
GFR calc Af Amer: 108 mL/min/{1.73_m2} (ref >59)
GFR calc non Af Amer: 94 mL/min/{1.73_m2} (ref >59)
Globulin, Total: 2.2 g/dL (ref 1.5–4.5)
Glucose: 78 mg/dL (ref 65–99)
Potassium: 5.1 mmol/L (ref 3.5–5.2)
Sodium: 145 mmol/L — ABNORMAL HIGH (ref 134–144)
Total Protein: 6.5 g/dL (ref 6.0–8.5)

## 2020-02-05 LAB — IGG, IGA, IGM
IgA/Immunoglobulin A, Serum: 56 mg/dL — ABNORMAL LOW (ref 87–352)
IgG (Immunoglobin G), Serum: 771 mg/dL (ref 586–1602)
IgM (Immunoglobulin M), Srm: 14 mg/dL — ABNORMAL LOW (ref 26–217)

## 2020-02-05 LAB — VITAMIN D 25 HYDROXY (VIT D DEFICIENCY, FRACTURES): Vit D, 25-Hydroxy: 52.7 ng/mL (ref 30.0–100.0)

## 2020-02-05 LAB — CD19 AND CD20, FLOW CYTOMETRY

## 2020-02-11 ENCOUNTER — Encounter: Payer: Self-pay | Admitting: Family Medicine

## 2020-02-15 ENCOUNTER — Other Ambulatory Visit: Payer: Self-pay | Admitting: Neurology

## 2020-03-07 ENCOUNTER — Ambulatory Visit: Payer: Medicare Other | Admitting: Nurse Practitioner

## 2020-03-08 ENCOUNTER — Other Ambulatory Visit: Payer: Self-pay

## 2020-03-08 ENCOUNTER — Ambulatory Visit: Payer: Medicare Other | Admitting: Nurse Practitioner

## 2020-03-08 ENCOUNTER — Encounter: Payer: Self-pay | Admitting: Nurse Practitioner

## 2020-03-08 VITALS — BP 120/78

## 2020-03-08 DIAGNOSIS — R87612 Low grade squamous intraepithelial lesion on cytologic smear of cervix (LGSIL): Secondary | ICD-10-CM | POA: Diagnosis not present

## 2020-03-08 NOTE — Progress Notes (Signed)
   Acute Office Visit  Subjective:    Patient ID: Ann Oconnor, female    DOB: 1975-06-13, 45 y.o.   MRN: [id number]   HPI 45 y.o. presents today for 6 month pap repeat. 2008 LEEP LGSIL, 2017/2019 LGSIL negative biopsy, 2020/2021 LGSIL negative HPV.    Review of Systems  Constitutional: Negative.   Genitourinary: Negative.        Objective:    Physical Exam Constitutional:      Appearance: Normal appearance.  Genitourinary:    General: Normal vulva.     Vagina: Normal.     Cervix: Normal.     BP 120/78 (BP Location: Right Arm, Patient Position: Sitting, Cuff Size: Normal)   LMP 07/18/2017  Wt Readings from Last 3 Encounters:  09/23/19 192 lb (87.1 kg)  05/25/19 194 lb 8 oz (88.2 kg)  11/25/18 186 lb (84.4 kg)        Assessment & Plan:   Problem List Items Addressed This Visit   None   Visit Diagnoses    Low grade squamous intraepithelial lesion on cytologic smear of cervix (LGSIL)    -  Primary      Plan: Pap with HR HPV today. If screening remains LGSIL negative HR HPV we will return to annual paps per guidelines, otherwise we will triage based on results. She is agreeable to pain.     Olivia Mackie [facility], 11:49 AM 03/08/2020

## 2020-03-08 NOTE — Addendum Note (Signed)
Addended by: Aura Camps on: 03/08/2020 12:00 PM   Modules accepted: Orders

## 2020-03-09 LAB — PAP, TP IMAGING W/ HPV RNA, RFLX HPV TYPE 16,18/45: HPV DNA High Risk: NOT DETECTED

## 2020-07-04 ENCOUNTER — Encounter: Payer: Self-pay | Admitting: Family Medicine

## 2020-07-05 ENCOUNTER — Encounter: Payer: Self-pay | Admitting: Internal Medicine

## 2020-07-27 ENCOUNTER — Ambulatory Visit: Payer: Medicare Other | Admitting: Family Medicine

## 2020-08-02 NOTE — Patient Instructions (Signed)
Below is our plan:  We will continue current treatment plan. I will update labs today.   Please make sure you are staying well hydrated. I recommend 50-60 ounces daily. Well balanced diet and regular exercise encouraged. Consistent sleep schedule with 6-8 hours recommended.   Please continue follow up with care team as directed.   Follow up with Dr Felecia Shelling in 6 months   You may receive a survey regarding today's visit. I encourage you to leave honest feed back as I do use this information to improve patient care. Thank you for seeing me today!      Multiple Sclerosis Multiple sclerosis (MS) is a disease of the brain, spinal cord, and optic nerves (central nervous system). It causes the body's disease-fighting (immune) system to destroy the protective covering (myelin sheath) around nerves in the brain. When this happens, signals (nerve impulses) going to and from the brain and spinal cord do not get sent properly or may not get sent at all. There are several types of MS:  Relapsing-remitting MS. This is the most common type. This causes sudden attacks of symptoms. After an attack, you may recover completely until the next attack, or some symptoms may remain permanently.  Secondary progressive MS. This usually develops after the onset of relapsing-remitting MS. Similar to relapsing-remitting MS, this type also causes sudden attacks of symptoms. Attacks may be less frequent, but symptoms slowly get worse (progress) over time.  Primary progressive MS. This causes symptoms that steadily progress over time. This type of MS does not cause sudden attacks of symptoms. The age of onset of MS varies, but it often develops between 57-23 years of age. MS is a lifelong (chronic) condition. There is no cure, but treatment can help slow down the progression of the disease. What are the causes? The cause of this condition is not known. What increases the risk? You are more likely to develop this  condition if:  You are a woman.  You have a relative with MS. However, the condition is not passed from parent to child (inherited).  You have a lack (deficiency) of vitamin D.  You smoke. MS is more common in the [place] than in the [place]. What are the signs or symptoms? Relapsing-remitting and secondary progressive MS cause symptoms to occur in episodes or attacks that may last weeks to months. There may be long periods between attacks in which there are almost no symptoms. Primary progressive MS causes symptoms to steadily progress after they develop. Symptoms of MS vary because of the many different ways it affects the central nervous system. The main symptoms include:  Vision problems and eye pain.  Numbness and weakness.  Inability to move your arms, hands, feet, or legs (paralysis).  Balance problems.  Shaking that you cannot control (tremors).  Muscle spasms.  Problems with thinking (cognitive changes). MS can also cause symptoms that are associated with the disease, but are not always the direct result of an MS attack. They may include:  Inability to control urination or bowel movements (incontinence).  Headaches.  Fatigue.  Inability to tolerate heat.  Emotional changes.  Depression.  Pain. How is this diagnosed? This condition is diagnosed based on:  Your symptoms.  A neurological exam. This involves checking central nervous system function, such as nerve function, reflexes, and coordination.  MRIs of the brain and spinal cord.  Lab tests, including a lumbar puncture that tests the fluid that surrounds the brain and spinal cord (cerebrospinal  fluid).  Tests to measure the electrical activity of the brain in response to stimulation (evoked potentials). How is this treated? There is no cure for MS, but medicines can help decrease the number and frequency of attacks and help relieve nuisance symptoms. Treatment options may  include:  Medicines that reduce the frequency of attacks. These medicines may be given by injection, by mouth (orally), or through an IV.  Medicines that reduce inflammation (steroids). These may provide short-term relief of symptoms.  Medicines to help control pain, depression, fatigue, or incontinence.  Nutritional counseling. Vitamin D supplements, if you have a deficiency.  Using devices to help you move around (assistive devices), such as braces, a cane, or a walker.  Physical therapy to strengthen and stretch your muscles.  Occupational therapy to help you with everyday tasks.  Alternative or complementary treatments such as exercise, massage, or acupuncture.   Follow these instructions at home:  Take over-the-counter and prescription medicines only as told by your health care provider.  Do not drive or use heavy machinery while taking prescription pain medicine.  Use assistive devices as recommended by your physical therapist or your health care provider.  Exercise as directed by your health care provider.  Eating healthy can help manage MS symptoms.  Return to your normal activities as told by your health care provider. Ask your health care provider what activities are safe for you.  Reach out for support. Share your feelings with friends, family, or a support group.  Keep all follow-up visits as told by your health care provider and therapists. This is important. Where to find more information  National Multiple Sclerosis Society: https://www.nationalmssociety.[place] [facility] [employer] [facility] [employer]: http://hendricks-barton.net/  [employer]: [employer] Contact a health care provider if:  You feel depressed.  You develop new pain or numbness.  You have tremors.  You have problems with sexual function. Get help right away if:  You develop paralysis.  You develop  numbness.  You have problems with your bladder or bowel function.  You develop double vision.  You lose vision in one or both eyes.  You develop suicidal thoughts.  You develop severe confusion. If you ever feel like you may hurt yourself or others, or have thoughts about taking your own life, get help right away. You can go to your nearest emergency department or call:  Your local emergency services (911 in the [place]  A suicide crisis helpline, such as the [facility] at 872-280-5301. This is open 24 hours a day. Summary  Multiple sclerosis (MS) is a disease of the central nervous system that causes the body's immune system to destroy the protective covering (myelin sheath) around nerves in the brain.  There are 3 types of MS: relapsing-remitting, secondary progressive, and primary progressive. Relapsing-remitting and secondary progressive MS cause symptoms to occur in episodes or attacks that may last weeks to months. Primary progressive MS causes symptoms to steadily progress after they develop.  There is no cure for MS, but medicines can help decrease the number and frequency of attacks and help relieve nuisance symptoms. Treatment may also include physical or occupational therapy.  If you develop numbness, paralysis, vision problems, or other neurological symptoms, get help right away. This information is not intended to replace advice given to you by your health care provider. Make sure you discuss any questions you have with your health care provider. Document Revised: 12/01/2019 Document Reviewed: 12/01/2019 Elsevier Patient Education  [place].

## 2020-08-02 NOTE — Progress Notes (Signed)
Chief Complaint  Patient presents with  . Follow-up    RM 1, alone. MS f/u, no new or worsening sx.      HISTORY OF PRESENT ILLNESS: 08/04/2020 ALL: Ann Oconnor returns for follow up for aggressive RRMS. She continues Ocrevus infusions every 8 months. Last IgM 14, IgA 56. lAst infusion 02/18/2020, next scheduled in August. Last MRI brain stable 12/2018.   She continues to do well. She denies new or exacerbating symptoms.   Gabapentin continues to help with generalized pain and left leg numbness. She feels that gait is improved. She denies falls. Occasional increased weakness with increased stress or fatigue.   Mood is stable on Pristiq. She is sleeping well. No changes in bowel/bladder habits. She continues vitamin D 5,000iu daily.   02/02/2020 ALL:  Ann Oconnor is a 45 y.o. female here today for follow up for aggressive RRMS. She continues Ocrevus every 8 months due to low antibodies. IgM 14, IgA 56. MRI stable in 12/2018. Last infusion 06/2019. Next infusion 12/16.   She feels that symptoms are stable. No new or exacerbating symptoms. She has moved since last being seen and denies any worsening symptoms with increased stress. She is settling in now.   She completed 8 weeks of physical therapy over the summer. She feels that this helped with her balance. She does continued to drag left foot when she is tired or walking for longer periods. No assistive devices. No falls.   She continues gabapentin for generalized pain and numbness. She is tolerating it well. She is sleeping well. Mood is stbale on Pristiq.   She continues vitamin D 5000iu daily.    HISTORY (copied from previous note)  Ann Oconnor is a 45 y.o. woman with aggressive relapsing remitting MS who was diagnosed in 2002.   She had a very severe series of exacerbations in 2016 after stopping Gilenya.     Update 05/25/2019: She has been on Ocrevus --- her last infusion was July 2020.   Her next infusion will be next week.    We stretched to every 8 months due to low IgM and IgA (IgG ws ok).  She asked about ofatumumab we discussed it is very similar to ocrelizumab though is self injectable.  We would still have to be concerned about low antibody levels.  She feels her gait is doing a little worse, especially the last month and she feels her balance is off.  Her last MRi was 12/13/2018.   Her left leg is worse than the right.    She has a foot drop that worsens with longer distances.   She wanted to discuss the Bioness device.   Arm strength is good.  She notes no difficulties with her vision.  She has some fatigue but feels this is stable.  She sleeps well most nights.  Mood and cognition is doing well.  Update 11/25/2018: She has been on Ocrevus x 3 years, last infusion ws July 2020.    Her IgG and IgM have been low and we discussed stretching the Ocrevus out to every 8 months.     No URI but had bad UTI a few months ago.  She had chills with shaking but did not have a fever.   She has had a few bad UTIs but does not note issues with urinary hesitancy or problems emptying.     Gait is doing well.  She notes some left leg weakness but it is stable and much better than  a few years ago.    She has mild left leg numbness.  Vision is fine.  She has some fatigue.   She is sleeping well.   Notes no changes in cognition and mood is doing well.     Update 05/05/2018:  She has been on Ocrevus since 2017 and is tolerating it well.   The last infusion was 03/31/2018.  IgM has been low at 14 (26-217 normal) and IgA is mildly low at 59 (87 up normal) but these are stable.   No exacerbations or new symptoms.   She did not do the flu shot this year and we discussed getting it.    Gait is ok and she does not use a cane.   When tired, she has some gait issues due to left > right leg weakness.   She exercises almost every day.  Her left leg is mildly spastic.       She has mild leg numbness.    Bladder is doing well.   Vision is fine.   She  has no fatigue most days.    She sleeps well most nights.   Mood and cognition are doing well.    Update 10/30/2017: She is doing well.   She has been on Ocrevus for close to  Years and was on Novantrone before that for 4-5 cycles.     She denies any exacerbations.     She has no side effects form Ocrevus and has insomnia from the steroid x 1 night.     Labwork from earlier this year showed IgM was low at 14 (26-217) and IgG and CBC/diff were normal.   Her gait is doing well and she can climb stairs.     She denies numbness in her legs but has mild left sided weakness (old and much better than in 2016).   Bladder is doing well.  She denies much fatigue and is going to the gym daily.    She sleeps well most night.   Mood is doing well.    Cognition is fine.      Update 05/01/2017:  She had a small exacerbation in September 2018 with right leg weakness. She received a couple days of IV steroids and improved. She now feels that she is doing well. Her last (this infusion was just this month. She is tolerating it well. Before that she was on Novantrone and before that [place].  She is walking better now and could walk a mile without stopping.   She is not using her cane.    Balance is mildly wobbly at times but better than in September.       Vision is doing well.    Bladder is now doing well without any med's.    She is sleeping well at night and her fatigue is mild.      Mood is doing well and she is on Prozac.Marland Kitchen      She takes vitamin D supplements for her low vitamin D.  Update 11/13/2016:   Ann Oconnor was last seen 2 weeks ago. She was stable at that time on ocrelizumab.   Over the weekend, she noted more difficulty with gait.   She notes her right leg is mildly weaker and she is throwing the foot put as she walks.  Balance is mildly worse.   She started using her cane again.    She denies any falls. She denies any fevers or infections.   Bladder function is fine.  Vision is unchanged.   She has fatigue  and amantadine was tried after the last visit.   She noted no improvement and stopped.    Adderall was not tolerated in the past.    She sleeps well at night.  At last visit we checked vitamin D. It was low and she is taking high-dose supplements and we'll switch to OTC supplements in a few months.  Update 10/29/2016:  She is a ocrelizumab. Her next infusion is later this week. She has not had any exacerbations while on ocrelizumab and she tolerates it well. Before that she was on Novantrone which helped to stabilize her MS but she needed to switch due to the lifetime limited.  She feels gait has improved further this year. Specifically she is walking well without a cane. She is able to do about 1 mile. She does need to look down at the ground or her feet when she walks for better balance and she did have 1 fall.    She continues to have numbness in both legs that has mildly improved over the last year. Bladder function is doing well and she has not had any incontinence since her last visit. She continues to experience fatigue that is physical more than cognitive. Adderall made her jittery so she stopped. She does not think she has ever been on amantadine.   __________________________________ From 04/19/2016: MS:    She switched from Novantrone to ocrelizumab in July 2017.     She tolerated the infusion well.  She had a total of 5 Novantrone treatments at 12 mg/M2.     She continues to have some deficits from her severe exacerbations early 2016.                                                                     Gait/strength:  She is able to walk much better and has walked a mie without a cane but that tires her out .   She regularly walks > 100 yards without rest.     She can climb a short flight of stairs.   She is driving now short distances.  She has mild weakness and spasticity in legs but not arms   Gait issues are due more to balance than strength.     Numbness:   She has bilateral fairly  symmetric numbness in her arms and legs since the 2016 exaceration.    Numbness is not complete as it was but is still severe.   She had normal NCV.    Numbness is slightly tingling but not painful.   She is on gabapentin 400 mg twice a day with benefit   Bladder/bowel:   She reports no incontinence since last visit.     No significant frequency.    Vision:  She denies MS related visual issues.  Fatigue/sleep:  She has fatigue most days, worse if more active and worse in the afternoons.   Fatigue is more cognitive many days than physical.    She is able to sleep fairly well at night.  Mood/cognition:   Mood is much better than in 2016 and early 2017.  She has mild depression and anxiety, helped by Paxil.  Her cognitive issues have returned close to baseline.  She notes some verbal fluency issues.    MS:    She presented in 2002 with numbness in her legs and poor gait.  The MRI scan showed MS plaques in the spinal cord and the brain and she was diagnosed with multiple sclerosis. She was initially placed on Rebif. At first she did well with only a couple of small central exacerbations. However, 2003 she had a more severe exacerbation that affected her gait. She was placed on a combination Rebif and Copaxone for a short period of time and then was on Copaxone monotherapy. She switched to Albin in 2011 due to needle fatigue. She had numbness in her feet that would not go away and was switched to Tecfidera from [place] in November 2015.  In January 2016, she lost strength in both legs and urinary incontinence. She had a course of IV steroids without any benefit in late January. Because there was no benefit, she was admitted to [facility] for a course of plasmapheresis starting February 5. She got no benefit from the plasmapheresis. During the next week, she was home but she was doing worse and worse. She then presented to [facility]. She was admitted for another week of steroids. An MRI done at  admission showed very aggressive MS changes.    Brain 04/28/2014 shows multiple infratentorial plaques including left greater than right middle cerebellar peduncles, bilateral pons, and cerebellum. Additionally there are multiple foci in the hemispheres, some with a concentric demyelinating pattern that could be consistent with Balo's concentric sclerosis, an especially fulminant form of multiple sclerosis. She was unable to complete the examination or get contrast due to the need to terminate the study early.   When compared to an MRI dated 02/07/2012, there has been dramatic change with multiple new brain stem and hemispheric foci.  MRI of the cervical spine 10/26/2010. showed multiple plaques:  right at the C2 level, left aspect C2-3 level, right aspect C3 level, the posterior central aspect C3 level, upper C6 level slightly greater to the right and C7 level greater to the left. Abnormal signal within the left aspect of the cord at the T3- T4 level.   MRI of the brain in 05/19/2014 showed several additional posterior fossa lesions prompting initiation of Novantrone.      REVIEW OF SYSTEMS: Out of a complete 14 system review of symptoms, the patient complains only of the following symptoms, imbalance, anxiety/depression, numbness, left sided weakness and all other reviewed systems are negative.   ALLERGIES: No Known Allergies   HOME MEDICATIONS: Outpatient Medications Prior to Visit  Medication Sig Dispense Refill  . acetaminophen (TYLENOL) 500 MG tablet Take 1,500 mg by mouth daily as needed for moderate pain or headache.    . Cholecalciferol (VITAMIN D PO) Take 5,000 Units by mouth daily.     Marland Kitchen desvenlafaxine (PRISTIQ) 100 MG 24 hr tablet TAKE 1 TABLET(100 MG) BY MOUTH DAILY 90 tablet 3  . gabapentin (NEURONTIN) 400 MG capsule TAKE 1 CAPSULE(400 MG) BY MOUTH TWICE DAILY 180 capsule 3   No facility-administered medications prior to visit.     PAST MEDICAL HISTORY: Past Medical History:   Diagnosis Date  . Anxiety   . Family history of adverse reaction to anesthesia    Father - difficulty awaken- has sleep apena  . Gait instability   . Head injury, closed, with brief LOC ([place]) 2003   fall  . Incontinence   . LGSIL (low grade squamous intraepithelial dysplasia) 03/2015   positive high  risk HPV subtype 18/45.  Colpo normal with neg ECC  . Movement disorder   . MS (multiple sclerosis) ([place]) 2002  . Seizures ([place]) 2003   none since 2003  . UTI (lower urinary tract infection)   . Vision abnormalities      PAST SURGICAL HISTORY: Past Surgical History:  Procedure Laterality Date  . CERVICAL CONE BIOPSY  2008   CIN 2  . COLONOSCOPY    . PORT-A-CATH REMOVAL N/A 02/16/2016   Procedure: REMOVAL PORT-A-CATH;  Surgeon: Donnie Mesa, MD;  Location: [place];  Service: General;  Laterality: N/A;  . PORTACATH PLACEMENT Left 08/18/14  . PORTACATH PLACEMENT Left 08/18/2014   Procedure: LEFT SUBCLAVIAN VEIN PORT PLACEMENT;  Surgeon: Donnie Mesa, MD;  Location: [facility];  Service: General;  Laterality: Left;     FAMILY HISTORY: Family History  Problem Relation Age of Onset  . Thyroid disease Mother   . Heart disease Father   . Mitral valve prolapse Father   . Cancer Father        lung/brain- tumors  . Thyroid disease Sister   . Cancer Paternal Aunt        not sure what kind, maybe cervix  . Thyroid disease Sister   . Breast cancer Neg Hx      SOCIAL HISTORY: Social History   Socioeconomic History  . Marital status: Single    Spouse name: Not on file  . Number of children: Not on file  . Years of education: Not on file  . Highest education level: Not on file  Occupational History  . Not on file  Tobacco Use  . Smoking status: Never Smoker  . Smokeless tobacco: Never Used  Vaping Use  . Vaping Use: Never used  Substance and Sexual Activity  . Alcohol use: No  . Drug use: No  . Sexual activity: Not Currently    Comment: 1st intercourse- 23, partners- 3,    Other Topics Concern  . Not on file  Social History Narrative  . Not on file   Social Determinants of Health   Financial Resource Strain: Not on file  Food Insecurity: Not on file  Transportation Needs: Not on file  Physical Activity: Not on file  Stress: Not on file  Social Connections: Not on file  Intimate Partner Violence: Not on file      PHYSICAL EXAM  Vitals:   08/04/20 1009  BP: 104/78  Pulse: 75  Weight: 206 lb (93.4 kg)  Height: 5\' 3"  (1.6 m)   Body mass index is 36.49 kg/m.   Generalized: Well developed, in no acute distress  Cardiology: normal rate and rhythm, no murmur auscultated  Respiratory: clear to auscultation bilaterally    Neurological examination  Mentation: Alert oriented to time, place, history taking. Follows all commands speech and language fluent Cranial nerve II-XII: Pupils were equal round reactive to light. Extraocular movements were full, visual field were full on confrontational test. Facial sensation and strength were normal. Head turning and shoulder shrug  were normal and symmetric. Motor: The motor testing reveals 5 over 5 strength of all 4 extremities with exception of 4+/5 left hip flexion.  Sensory: Sensory testing is intact to soft touch on all 4 extremities. No evidence of extinction is noted.  Coordination: Cerebellar testing reveals good finger-nose-finger and heel-to-shin bilaterally.  Gait and station: Gait is stable with no assistive device, no obvious footdrop noted today, tandem is unsteady.  Reflexes diminished in bilateral patellar but symmetric  DIAGNOSTIC DATA (LABS, IMAGING, TESTING) - I reviewed patient records, labs, notes, testing and imaging myself where available.  Lab Results  Component Value Date   WBC 4.2 02/02/2020   HGB 12.4 02/02/2020   HCT 37.9 02/02/2020   MCV 95 02/02/2020   PLT 363 02/02/2020      Component Value Date/Time   NA 145 (H) 02/02/2020 1101   K 5.1 02/02/2020 1101   CL 104  02/02/2020 1101   CO2 26 02/02/2020 1101   GLUCOSE 78 02/02/2020 1101   GLUCOSE 84 09/23/2019 1118   BUN 10 02/02/2020 1101   CREATININE 0.77 02/02/2020 1101   CREATININE 0.82 09/23/2019 1118   CALCIUM 9.2 02/02/2020 1101   PROT 6.5 02/02/2020 1101   ALBUMIN 4.3 02/02/2020 1101   AST 14 02/02/2020 1101   ALT 9 02/02/2020 1101   ALKPHOS 84 02/02/2020 1101   BILITOT <0.2 02/02/2020 1101   GFRNONAA 94 02/02/2020 1101   GFRAA 108 02/02/2020 1101   Lab Results  Component Value Date   CHOL 143 10/30/2017   HDL 62 10/30/2017   LDLCALC 68 10/30/2017   TRIG 64 10/30/2017   CHOLHDL 2.3 10/30/2017   No results found for: HGBA1C Lab Results  Component Value Date   EYCXKGYJ85 631 04/04/2015   Lab Results  Component Value Date   TSH 2.871 08/21/2011      ASSESSMENT AND PLAN  45 y.o. year old female  has a past medical history of Anxiety, Family history of adverse reaction to anesthesia, Gait instability, Head injury, closed, with brief LOC ([place]) (2003), Incontinence, LGSIL (low grade squamous intraepithelial dysplasia) (03/2015), Movement disorder, MS (multiple sclerosis) ([place]) (2002), Seizures ([place]) (2003), UTI (lower urinary tract infection), and Vision abnormalities. here with   Relapsing remitting multiple sclerosis ([place]) - Plan: CBC with Differential/Platelets, IgG, IgA, IgM, CD19 and CD20, Flow Cytometry  High risk medication use - Plan: CBC with Differential/Platelets, IgG, IgA, IgM, CD19 and CD20, Flow Cytometry  Ataxic gait  Vitamin D deficiency  Depression with anxiety   Andre is doing well, today. We will continue current treatment plan. She has scheduled Ocrevus infusion in August. MRI stable 12/2018, no new or worsening symptoms. Will consider repeating imaging at next follow up. I will update labs, today. She will continue gabapentin, Pristiq and vitamin D supplements. She will continue healthy lifestyle habits. Fall precautions reviewed. She will follow up with Dr  Felecia Shelling in 6 months, sooner if needed pending labs. She verbalizes understanding and agreement with this plan.    Debbora Presto, MSN, FNP-C 08/04/2020, 11:09 AM  [facility] [address] [place], [place] [address] [phone]

## 2020-08-04 ENCOUNTER — Ambulatory Visit: Payer: Medicare Other | Admitting: Family Medicine

## 2020-08-04 ENCOUNTER — Encounter: Payer: Self-pay | Admitting: Family Medicine

## 2020-08-04 VITALS — BP 104/78 | HR 75 | Ht 63.0 in | Wt 206.0 lb

## 2020-08-04 DIAGNOSIS — Z79899 Other long term (current) drug therapy: Secondary | ICD-10-CM | POA: Diagnosis not present

## 2020-08-04 DIAGNOSIS — R260 Ataxic gait: Secondary | ICD-10-CM

## 2020-08-04 DIAGNOSIS — G35 Multiple sclerosis: Secondary | ICD-10-CM

## 2020-08-04 DIAGNOSIS — F418 Other specified anxiety disorders: Secondary | ICD-10-CM

## 2020-08-04 DIAGNOSIS — E559 Vitamin D deficiency, unspecified: Secondary | ICD-10-CM

## 2020-08-04 NOTE — Progress Notes (Signed)
I have read the note, and I agree with the clinical assessment and plan.  Lashay Osborne A. Harlow Basley, MD, PhD, FAAN Certified in Neurology, Clinical Neurophysiology, Sleep Medicine, Pain Medicine and Neuroimaging  [facility] [address], [address] , [place] [address] [phone]  

## 2020-08-05 LAB — CBC WITH DIFFERENTIAL/PLATELET
Basophils Absolute: 0.1 10*3/uL (ref 0.0–0.2)
Basos: 1 %
EOS (ABSOLUTE): 0.1 10*3/uL (ref 0.0–0.4)
Eos: 2 %
Hematocrit: 36.8 % (ref 34.0–46.6)
Hemoglobin: 12.1 g/dL (ref 11.1–15.9)
Immature Grans (Abs): 0 10*3/uL (ref 0.0–0.1)
Immature Granulocytes: 0 %
Lymphocytes Absolute: 1.1 10*3/uL (ref 0.7–3.1)
Lymphs: 21 %
MCH: 30.9 pg (ref 26.6–33.0)
MCHC: 32.9 g/dL (ref 31.5–35.7)
MCV: 94 fL (ref 79–97)
Monocytes Absolute: 0.5 10*3/uL (ref 0.1–0.9)
Monocytes: 9 %
Neutrophils Absolute: 3.6 10*3/uL (ref 1.4–7.0)
Neutrophils: 67 %
Platelets: 338 10*3/uL (ref 150–450)
RBC: 3.92 x10E6/uL (ref 3.77–5.28)
RDW: 12.2 % (ref 11.7–15.4)
WBC: 5.3 10*3/uL (ref 3.4–10.8)

## 2020-08-05 LAB — IGG, IGA, IGM
IgA/Immunoglobulin A, Serum: 55 mg/dL — ABNORMAL LOW (ref 87–352)
IgG (Immunoglobin G), Serum: 680 mg/dL (ref 586–1602)
IgM (Immunoglobulin M), Srm: 9 mg/dL — ABNORMAL LOW (ref 26–217)

## 2020-08-12 LAB — STATUS REPORT

## 2020-08-16 LAB — COMP PANEL: LEUKEMIA/LYMPHOMA

## 2020-08-16 LAB — SPECIMEN STATUS REPORT

## 2020-08-16 NOTE — Progress Notes (Signed)
I called pt and relayed lab results to her from [facility] and Dr. Felecia Shelling .  "Everything looks ok but her IgM is a little lower than it has been. Her IgG is still in a normal range so we are going to continue Ocrevus at the 8 month intervals. Dr Felecia Shelling will discuss this with her in detail at next visit but no need for anychange in treatment plan at this time."  Pt verbalized understanding.  She is scheduled in 10/2020 with [facility].  She wanted me to let Liane know her new #.  Will let her know.  She verbalized understanding.

## 2020-08-23 ENCOUNTER — Other Ambulatory Visit: Payer: Self-pay

## 2020-08-23 ENCOUNTER — Ambulatory Visit (AMBULATORY_SURGERY_CENTER): Payer: Medicare Other

## 2020-08-23 VITALS — Ht 63.0 in | Wt 206.0 lb

## 2020-08-23 DIAGNOSIS — Z1211 Encounter for screening for malignant neoplasm of colon: Secondary | ICD-10-CM

## 2020-08-23 NOTE — Progress Notes (Signed)
No allergies to soy or egg Pt is not on blood thinners or diet pills Denies issues with sedation/intubation Denies atrial flutter/fib Denies constipation -no issues at this time  Emmi instructions given to pt  Pt is aware of Covid safety and care partner requirements.

## 2020-08-31 ENCOUNTER — Encounter: Payer: Self-pay | Admitting: Internal Medicine

## 2020-09-06 ENCOUNTER — Other Ambulatory Visit: Payer: Self-pay

## 2020-09-06 ENCOUNTER — Ambulatory Visit (AMBULATORY_SURGERY_CENTER): Payer: Medicare Other | Admitting: Internal Medicine

## 2020-09-06 ENCOUNTER — Encounter: Payer: Self-pay | Admitting: Internal Medicine

## 2020-09-06 VITALS — BP 105/73 | HR 61 | Temp 98.4°F | Resp 13 | Ht 63.0 in | Wt 206.0 lb

## 2020-09-06 DIAGNOSIS — K621 Rectal polyp: Secondary | ICD-10-CM

## 2020-09-06 DIAGNOSIS — K5140 Inflammatory polyps of colon without complications: Secondary | ICD-10-CM | POA: Diagnosis not present

## 2020-09-06 DIAGNOSIS — Z1211 Encounter for screening for malignant neoplasm of colon: Secondary | ICD-10-CM | POA: Diagnosis not present

## 2020-09-06 DIAGNOSIS — D128 Benign neoplasm of rectum: Secondary | ICD-10-CM

## 2020-09-06 MED ORDER — SODIUM CHLORIDE 0.9 % IV SOLN: 500.0000 mL | Freq: Once | INTRAVENOUS | Status: DC

## 2020-09-06 NOTE — Progress Notes (Signed)
Pt. Reports no change in her medical and surgical history since her pre-visit 6//21/2022.

## 2020-09-06 NOTE — Op Note (Signed)
[place] Patient Name: Ann Oconnor Procedure Date: 09/06/2020 1:38 PM MRN: [id number] Endoscopist: Gatha Mayer , MD Age: 45 Referring MD:  Date of Birth: Jul 13, 1975 Gender: Female Account #: [id number] Procedure:                Colonoscopy Indications:              Screening for colorectal malignant neoplasm, This                            is the patient's first colonoscopy Medicines:                Propofol per Anesthesia, Monitored Anesthesia Care Procedure:                Pre-Anesthesia Assessment:                           - Prior to the procedure, a History and Physical                            was performed, and patient medications and                            allergies were reviewed. The patient's tolerance of                            previous anesthesia was also reviewed. The risks                            and benefits of the procedure and the sedation                            options and risks were discussed with the patient.                            All questions were answered, and informed consent                            was obtained. Prior Anticoagulants: The patient has                            taken no previous anticoagulant or antiplatelet                            agents. ASA Grade Assessment: II - A patient with                            mild systemic disease. After reviewing the risks                            and benefits, the patient was deemed in                            satisfactory condition to undergo the procedure.  After obtaining informed consent, the colonoscope                            was passed under direct vision. Throughout the                            procedure, the patient's blood pressure, pulse, and                            oxygen saturations were monitored continuously. The                            CF [id number] #[id number] was introduced through the anus                             and advanced to the the cecum, identified by                            appendiceal orifice and ileocecal valve. The                            patient tolerated the procedure well. The quality                            of the bowel preparation was adequate. The bowel                            preparation used was Miralax via split dose                            instruction. The ileocecal valve, appendiceal                            orifice, and rectum were photographed. The                            colonoscopy was somewhat difficult due to                            significant looping. Successful completion of the                            procedure was aided by applying abdominal pressure. Scope In: 2:02:12 PM Scope Out: 2:22:36 PM Scope Withdrawal Time: 0 hours 12 minutes 7 seconds  Total Procedure Duration: 0 hours 20 minutes 24 seconds  Findings:                 The perianal and digital rectal examinations were                            normal.                           A 1 mm polyp was found in the rectum. The polyp was  sessile. The polyp was removed with a cold biopsy                            forceps. Resection and retrieval were complete.                            Verification of patient identification for the                            specimen was done. Estimated blood loss was minimal.                           The exam was otherwise without abnormality on                            direct and retroflexion views. Complications:            No immediate complications. Estimated Blood Loss:     Estimated blood loss was minimal. Impression:               - One 1 mm polyp in the rectum, removed with a cold                            biopsy forceps. Resected and retrieved.                           - The examination was otherwise normal on direct                            and retroflexion views. Recommendation:           - Patient has a  contact number available for                            emergencies. The signs and symptoms of potential                            delayed complications were discussed with the                            patient. Return to normal activities tomorrow.                            Written discharge instructions were provided to the                            patient.                           - Resume previous diet.                           - Continue present medications.                           - Await pathology results.                           -  Repeat colonoscopy is recommended. The                            colonoscopy date will be determined after pathology                            results from today's exam become available for                            review. Gatha Mayer, MD 09/06/2020 2:32:12 PM This report has been signed electronically.

## 2020-09-06 NOTE — Progress Notes (Signed)
PT taken to PACU. Monitors in place. VSS. Report given to RN. 

## 2020-09-06 NOTE — Progress Notes (Signed)
Called to room to assist during endoscopic procedure.  Patient ID and intended procedure confirmed with present staff. Received instructions for my participation in the procedure from the performing physician.  

## 2020-09-06 NOTE — Patient Instructions (Addendum)
I found and removed one very tiny polyp.  I will let you know pathology results and when to have another routine colonoscopy by mail and/or My Chart.  I appreciate the opportunity to care for you. Gatha Mayer, MD, [facility]  Handout given on polyps  YOU HAD AN ENDOSCOPIC PROCEDURE TODAY AT [place]:   Refer to the procedure report that was given to you for any specific questions about what was found during the examination.  If the procedure report does not answer your questions, please call your gastroenterologist to clarify.  If you requested that your care partner not be given the details of your procedure findings, then the procedure report has been included in a sealed envelope for you to review at your convenience later.  YOU SHOULD EXPECT: Some feelings of bloating in the abdomen. Passage of more gas than usual.  Walking can help get rid of the air that was put into your GI tract during the procedure and reduce the bloating. If you had a lower endoscopy (such as a colonoscopy or flexible sigmoidoscopy) you may notice spotting of blood in your stool or on the toilet paper. If you underwent a bowel prep for your procedure, you may not have a normal bowel movement for a few days.  Please Note:  You might notice some irritation and congestion in your nose or some drainage.  This is from the oxygen used during your procedure.  There is no need for concern and it should clear up in a day or so.  SYMPTOMS TO REPORT IMMEDIATELY:  Following lower endoscopy (colonoscopy or flexible sigmoidoscopy):  Excessive amounts of blood in the stool  Significant tenderness or worsening of abdominal pains  Swelling of the abdomen that is new, acute  Fever of 100F or higher   For urgent or emergent issues, a gastroenterologist can be reached at any hour by calling [phone]. Do not use MyChart messaging for urgent concerns.    DIET:  We do recommend a small meal at first, but then  you may proceed to your regular diet.  Drink plenty of fluids but you should avoid alcoholic beverages for 24 hours.  ACTIVITY:  You should plan to take it easy for the rest of today and you should NOT DRIVE or use heavy machinery until tomorrow (because of the sedation medicines used during the test).    FOLLOW UP: Our staff will call the number listed on your records 48-72 hours following your procedure to check on you and address any questions or concerns that you may have regarding the information given to you following your procedure. If we do not reach you, we will leave a message.  We will attempt to reach you two times.  During this call, we will ask if you have developed any symptoms of COVID 19. If you develop any symptoms (ie: fever, flu-like symptoms, shortness of breath, cough etc.) before then, please call [phone].  If you test positive for Covid 19 in the 2 weeks post procedure, please call and report this information to [place].    If any biopsies were taken you will be contacted by phone or by letter within the next 1-3 weeks.  Please call us at [phone] if you have not heard about the biopsies in 3 weeks.    SIGNATURES/CONFIDENTIALITY: You and/or your care partner have signed paperwork which will be entered into your electronic medical record.  These signatures attest to the fact that that the  information above on your After Visit Summary has been reviewed and is understood.  Full responsibility of the confidentiality of this discharge information lies with you and/or your care-partner.

## 2020-09-08 ENCOUNTER — Telehealth: Payer: Self-pay

## 2020-09-08 NOTE — Telephone Encounter (Signed)
  Follow up Call-  Call back number 09/06/2020  Post procedure Call Back phone  # [phone]  Permission to leave phone message Yes  Some recent data might be hidden     Patient questions:  Do you have a fever, pain , or abdominal swelling? No. Pain Score  0 *  Have you tolerated food without any problems? Yes.    Have you been able to return to your normal activities? Yes.    Do you have any questions about your discharge instructions: Diet   No. Medications  No. Follow up visit  No.  Do you have questions or concerns about your Care? No.  Actions: * If pain score is 4 or above: No action needed, pain <4.

## 2020-09-22 ENCOUNTER — Encounter: Payer: Self-pay | Admitting: Internal Medicine

## 2020-09-26 ENCOUNTER — Ambulatory Visit: Payer: Medicare Other | Admitting: Nurse Practitioner

## 2020-10-10 NOTE — Telephone Encounter (Signed)
Attempted f/u call back.

## 2020-11-15 ENCOUNTER — Ambulatory Visit (INDEPENDENT_AMBULATORY_CARE_PROVIDER_SITE_OTHER): Payer: Medicare Other | Admitting: Nurse Practitioner

## 2020-11-15 ENCOUNTER — Encounter: Payer: Self-pay | Admitting: Nurse Practitioner

## 2020-11-15 ENCOUNTER — Other Ambulatory Visit: Payer: Self-pay

## 2020-11-15 ENCOUNTER — Other Ambulatory Visit (HOSPITAL_COMMUNITY)
Admission: RE | Admit: 2020-11-15 | Discharge: 2020-11-15 | Disposition: A | Discharge status: Home / Self Care | Payer: Medicare Other | Source: Ambulatory Visit | Attending: Nurse Practitioner | Admitting: Nurse Practitioner

## 2020-11-15 VITALS — BP 116/70 | Ht 63.0 in | Wt 206.0 lb

## 2020-11-15 DIAGNOSIS — Z1151 Encounter for screening for human papillomavirus (HPV): Secondary | ICD-10-CM | POA: Insufficient documentation

## 2020-11-15 DIAGNOSIS — N870 Mild cervical dysplasia: Secondary | ICD-10-CM | POA: Insufficient documentation

## 2020-11-15 DIAGNOSIS — Z01419 Encounter for gynecological examination (general) (routine) without abnormal findings: Secondary | ICD-10-CM | POA: Diagnosis present

## 2020-11-15 DIAGNOSIS — Z780 Asymptomatic menopausal state: Secondary | ICD-10-CM

## 2020-11-15 NOTE — Progress Notes (Signed)
   Anza January 10, 1976 [id number]   History:  45 y.o. G0 presents for breast and pelvic exam without GYN complaints. 2008 LEEP for HGSIL, Persistent LGSIL negative HPV 2017-2022 LGSIL. Negative biopsy 2018, 2019 CIN-1. Last year she declined repeating colposcopy and wants to monitor with paps only at this time. History of vitamin D deficiency, HLD, MS. Early menopause at age 78 due to MS medication, elevated FSH at that time. Denies menopausal symptoms. Not sexually active.   Gynecologic History Patient's last menstrual period was 07/19/2014.   Contraception: post menopausal status  Health maintenance Last Pap: 03/08/2020. Results were: LGSIL negative high risk HPV Last mammogram: 11/16/2019. Results were: normal Last colonoscopy: 09/06/2020. Results were: benign polyp, 10-year recall Last Dexa: Not indicated  Past medical history, past surgical history, family history and social history were all reviewed and documented in the EPIC chart.  ROS:  A ROS was performed and pertinent positives and negatives are included.  Exam:  Vitals:   11/15/20 1346  BP: 116/70  Weight: 206 lb (93.4 kg)  Height: '5\' 3"'$  (1.6 m)    Body mass index is 36.49 kg/m.  General appearance:  Normal Thyroid:  Symmetrical, normal in size, without palpable masses or nodularity. Respiratory  Auscultation:  Clear without wheezing or rhonchi Cardiovascular  Auscultation:  Regular rate, without rubs, murmurs or gallops  Edema/varicosities:  Not grossly evident Abdominal  Soft,nontender, without masses, guarding or rebound.  Liver/spleen:  No organomegaly noted  Hernia:  None appreciated  Skin  Inspection:  Grossly normal   Breasts: Examined lying and sitting.   Right: Without masses, retractions, discharge or axillary adenopathy.   Left: Without masses, retractions, discharge or axillary adenopathy. Gentitourinary   Inguinal/mons:  Normal without inguinal adenopathy  External genitalia:   Normal  BUS/Urethra/Skene's glands:  Normal  Vagina:  Normal  Cervix:  Normal  Uterus:  Difficult to palpate due to body habitus but no gross masses or tenderness  Adnexa/parametria:     Rt: Without masses or tenderness.   Lt: Without masses or tenderness.  Anus and perineum: Normal  Digital rectal exam: Normal sphincter tone without palpated masses or tenderness  Patient informed chaperone available to be present for breast and pelvic exam. Patient has requested no chaperone to be present. Patient has been advised what will be completed during breast and pelvic exam.    Assessment/Plan:  45 y.o. G for breast and pelvic exam.  Well female exam with routine gynecological exam - Plan: Comprehensive metabolic panel. Education provided on SBEs, importance of preventative screenings, current guidelines, high calcium diet, regular exercise, and multivitamin daily. Labs with PCP.   LGSIL on Pap smear of cervix - Persistent LGSIL negative HPV 2017-2022 LGSIL. Negative biopsy 2018, 2019 CIN-1. Last year she declined repeating colposcopy and wants to monitor with paps only at this time. Pap with HR HPV today.   Postmenopausal - medicated-induced. No HRT, denies symptoms.   Screening for breast cancer - Normal mammogram history.  Continue annual screenings.  Normal breast exam today.  Screening for colon cancer - 09/2020 colonoscopy. Will repeat at GI's recommended interval.   Follow up in 1 year for annual.      Tamela Gammon [facility], 2:54 PM 11/15/2020

## 2020-11-21 LAB — CYTOLOGY - PAP
Adequacy: ABSENT
Comment: NEGATIVE
Diagnosis: UNDETERMINED — AB
High risk HPV: NEGATIVE

## 2020-11-22 ENCOUNTER — Other Ambulatory Visit: Payer: Self-pay | Admitting: Nurse Practitioner

## 2020-11-22 DIAGNOSIS — B9689 Other specified bacterial agents as the cause of diseases classified elsewhere: Secondary | ICD-10-CM

## 2020-11-22 MED ORDER — METRONIDAZOLE 500 MG PO TABS: 500.0000 mg | ORAL_TABLET | Freq: Two times a day (BID) | ORAL | 0 refills | Status: DC

## 2020-12-14 ENCOUNTER — Other Ambulatory Visit: Payer: Self-pay | Admitting: Internal Medicine

## 2020-12-14 DIAGNOSIS — Z1231 Encounter for screening mammogram for malignant neoplasm of breast: Secondary | ICD-10-CM

## 2021-01-12 ENCOUNTER — Ambulatory Visit
Admission: RE | Admit: 2021-01-12 | Discharge: 2021-01-12 | Disposition: A | Discharge status: Home / Self Care | Payer: Medicare Other | Source: Ambulatory Visit

## 2021-01-12 DIAGNOSIS — Z1231 Encounter for screening mammogram for malignant neoplasm of breast: Secondary | ICD-10-CM

## 2021-01-13 ENCOUNTER — Other Ambulatory Visit: Payer: Self-pay | Admitting: Internal Medicine

## 2021-01-13 DIAGNOSIS — R928 Other abnormal and inconclusive findings on diagnostic imaging of breast: Secondary | ICD-10-CM

## 2021-01-25 ENCOUNTER — Other Ambulatory Visit: Payer: Self-pay | Admitting: *Deleted

## 2021-01-25 ENCOUNTER — Encounter: Payer: Self-pay | Admitting: Neurology

## 2021-01-25 DIAGNOSIS — R260 Ataxic gait: Secondary | ICD-10-CM

## 2021-01-25 DIAGNOSIS — G35 Multiple sclerosis: Secondary | ICD-10-CM

## 2021-01-25 DIAGNOSIS — M21372 Foot drop, left foot: Secondary | ICD-10-CM

## 2021-02-09 ENCOUNTER — Ambulatory Visit: Payer: Medicare Other | Admitting: Neurology

## 2021-02-09 ENCOUNTER — Encounter: Payer: Self-pay | Admitting: Neurology

## 2021-02-09 VITALS — BP 92/55 | HR 89 | Ht 63.0 in | Wt 211.0 lb

## 2021-02-09 DIAGNOSIS — M542 Cervicalgia: Secondary | ICD-10-CM

## 2021-02-09 DIAGNOSIS — Z79899 Other long term (current) drug therapy: Secondary | ICD-10-CM

## 2021-02-09 DIAGNOSIS — M21372 Foot drop, left foot: Secondary | ICD-10-CM

## 2021-02-09 DIAGNOSIS — E559 Vitamin D deficiency, unspecified: Secondary | ICD-10-CM

## 2021-02-09 DIAGNOSIS — G35 Multiple sclerosis: Secondary | ICD-10-CM | POA: Diagnosis not present

## 2021-02-09 MED ORDER — DESVENLAFAXINE SUCCINATE ER 100 MG PO TB24: ORAL_TABLET | ORAL | 3 refills | Status: DC

## 2021-02-09 MED ORDER — GABAPENTIN 400 MG PO CAPS
ORAL_CAPSULE | ORAL | 3 refills | Status: DC
Start: 2021-02-09 — End: 2022-01-15

## 2021-02-09 NOTE — Progress Notes (Signed)
[facility]  PATIENT: Ann Oconnor DOB: 04/30/1975  REFERRING DOCTOR OR PCP:  Delia Chimes SOURCE: Patient, records, images  _________________________________   HISTORICAL  CHIEF COMPLAINT:  Chief Complaint  Patient presents with   Follow-up    Rm 1, w mother. Here for 6 month MS f/u, on Ocrevus. Last infusion date: 11/03/2020 Next infusion date: 07/06/2021. Pt reports being stable, no new or worsening in sx. Pt had a mammogram in Oct. Abnormal finding in R breast, will be going back for further testing.     HISTORY OF PRESENT ILLNESS:  Ann Oconnor is a 45 y.o. woman with aggressive relapsing remitting MS who was diagnosed in 2002.   She had a very severe series of exacerbations in 2016 after stopping Gilenya.     Update 02/09/2021: She has been on Ocrevus --- her last infusion was September 2022 and next infusion is May, 2023.  Marland Kitchen   Her next infusion will be next week.   We stretched to every 8 months due to low IgM and IgA (IgG ws ok).  She asked about ofatumumab we discussed it is very similar to ocrelizumab though is self injectable.  We would still have to be concerned about low antibody levels.  Gait is doing well with one fall on stairs when she tripped.    She has a mild left  foot drop and some spasticity  She no longer has numbness or tingling.       Bladder function is good.      Arm strength is good.  She notes no difficulties with her vision.    She has some fatigue but feels this is stable.  She sleeps well most nights.  Mood and cognition is doing well.   Pristiq  She has a breast U/S scheduled tomorrow --- had some asymmetry on mammogram with concern in right breast.      MS:    She presented in 2002 with numbness in her legs and poor gait.  The MRI scan showed MS plaques in the spinal cord and the brain and she was diagnosed with multiple sclerosis. She was initially placed on Rebif. At first she did well with only a couple of small central  exacerbations. However, 2003 she had a more severe exacerbation that affected her gait. She was placed on a combination Rebif and Copaxone for a short period of time and then was on Copaxone monotherapy. She switched to Alcona in 2011 due to needle fatigue. She had numbness in her feet that would not go away and was switched to Tecfidera from [place] in November 2015.  In January 2016, she lost strength in both legs and urinary incontinence. She had a course of IV steroids without any benefit in late January. Because there was no benefit, she was admitted to [facility] for a course of plasmapheresis starting February 5. She got no benefit from the plasmapheresis. During the next week, she was home but she was doing worse and worse. She then presented to [facility]. She was admitted for another week of steroids. An MRI done at admission showed very aggressive MS changes.    Brain 04/28/2014 shows multiple infratentorial plaques including left greater than right middle cerebellar peduncles, bilateral pons, and cerebellum. Additionally there are multiple foci in the hemispheres, some with a concentric demyelinating pattern that could be consistent with Balo's concentric sclerosis, an especially fulminant form of multiple sclerosis. She was unable to complete the examination or get contrast due  to the need to terminate the study early.   When compared to an MRI dated 02/07/2012, there has been dramatic change with multiple new brain stem and hemispheric foci.  MRI of the cervical spine 10/26/2010. showed multiple plaques:  right at the C2 level, left aspect C2-3 level, right aspect C3 level, the posterior central aspect C3 level, upper C6 level slightly greater to the right and C7 level greater to the left.  Abnormal signal within the left aspect of the cord at the T3- T4 level.   MRI of the brain in 05/19/2014 showed several additional posterior fossa lesions prompting initiation of Novantrone.    After 5 cycles of  Novantrone , she started Ocrevus in 2017. Marland Kitchen      MRI 12/13/2018 showed Multiple T2/flair hyperintense foci in the cerebellum, brainstem, thalamus and hemispheres in a pattern and configuration consistent with chronic demyelinating plaque associated with multiple sclerosis.  None of the foci appears to be acute and they do not enhance.  Compared to the MRI dated 11/06/2017, there are no new lesions.  REVIEW OF SYSTEMS: Constitutional: No fevers, chills, sweats, or change in appetite.  Fatigue Eyes: No visual changes, double vision, eye pain Ear, nose and throat: No hearing loss, ear pain, nasal congestion, sore throat Cardiovascular: No chest pain, palpitations Respiratory:  No shortness of breath at rest or with exertion.   No wheezes GastrointestinaI: No nausea, vomiting.  Some fecal incontinence Genitourinary:  as above. Musculoskeletal:  No neck pain, back pain Integumentary: No rash, pruritus, skin lesions Neurological: as above Psychiatric: Notes depression at this time.  Some anxiety Endocrine: No palpitations, diaphoresis, change in appetite, change in weigh or increased thirst Hematologic/Lymphatic:  No anemia, purpura, petechiae. Allergic/Immunologic: No itchy/runny eyes, nasal congestion, recent allergic reactions, rashes  ALLERGIES: No Known Allergies  HOME MEDICATIONS:  Current Outpatient Medications:    acetaminophen (TYLENOL) 500 MG tablet, Take 1,500 mg by mouth daily as needed for moderate pain or headache., Disp: , Rfl:    Ascorbic Acid (VITAMIN C) 250 MG CHEW, 1 tablet, Disp: , Rfl:    Cholecalciferol (VITAMIN D PO), Take 5,000 Units by mouth daily. , Disp: , Rfl:    desvenlafaxine (PRISTIQ) 100 MG 24 hr tablet, TAKE 1 TABLET(100 MG) BY MOUTH DAILY, Disp: 90 tablet, Rfl: 3   gabapentin (NEURONTIN) 400 MG capsule, TAKE 1 CAPSULE(400 MG) BY MOUTH TWICE DAILY, Disp: 180 capsule, Rfl: 3   Multiple Vitamins-Minerals (ZINC PO), Take by mouth., Disp: , Rfl:    ocrelizumab  (OCREVUS) 300 MG/10ML injection, See admin instructions., Disp: , Rfl:   PAST MEDICAL HISTORY: Past Medical History:  Diagnosis Date   Anxiety    Gait instability    Head injury, closed, with brief LOC ([place]) 2003   fall   Hyperlipidemia    Incontinence    LGSIL (low grade squamous intraepithelial dysplasia) 03/2015   positive high risk HPV subtype 18/45.  Colpo normal with neg ECC   Movement disorder    MS (multiple sclerosis) ([place]) 2002   Neuromuscular disorder ([place])    MS   Seizures ([place]) 2003   none since 2003   UTI (lower urinary tract infection)    Vision abnormalities     PAST SURGICAL HISTORY: Past Surgical History:  Procedure Laterality Date   CERVICAL CONE BIOPSY  2008   CIN 2   COLONOSCOPY     2010   PORT-A-CATH REMOVAL N/A 02/16/2016   Procedure: REMOVAL PORT-A-CATH;  Surgeon: Donnie Mesa, MD;  Location:  [facility];  Service: General;  Laterality: N/A;   PORTACATH PLACEMENT Left 08/18/2014   PORTACATH PLACEMENT Left 08/18/2014   Procedure: LEFT SUBCLAVIAN VEIN PORT PLACEMENT;  Surgeon: Donnie Mesa, MD;  Location: [facility];  Service: General;  Laterality: Left;    FAMILY HISTORY: Family History  Problem Relation Age of Onset   Thyroid disease Mother    Heart disease Father    Mitral valve prolapse Father    Cancer Father        lung/brain- tumors   Thyroid disease Sister    Thyroid disease Sister    Colon cancer Paternal Aunt    Cancer Paternal Aunt        not sure what kind, maybe cervix   Breast cancer Neg Hx    Colon polyps Neg Hx    Esophageal cancer Neg Hx    Rectal cancer Neg Hx    Stomach cancer Neg Hx     SOCIAL HISTORY:  Social History   Socioeconomic History   Marital status: Single    Spouse name: Not on file   Number of children: Not on file   Years of education: Not on file   Highest education level: Not on file  Occupational History   Not on file  Tobacco Use   Smoking status: Never   Smokeless tobacco: Never  Vaping Use    Vaping Use: Never used  Substance and Sexual Activity   Alcohol use: No   Drug use: No   Sexual activity: Not Currently    Comment: 1st intercourse- 23, partners- 3,   Other Topics Concern   Not on file  Social History Narrative   Not on file   Social Determinants of Health   Financial Resource Strain: Not on file  Food Insecurity: Not on file  Transportation Needs: Not on file  Physical Activity: Not on file  Stress: Not on file  Social Connections: Not on file  Intimate Partner Violence: Not on file     PHYSICAL EXAM  Vitals:   02/09/21 0947  BP: (!) 92/55  Pulse: 89  Weight: 211 lb (95.7 kg)  Height: 5\' 3"  (1.6 m)    Body mass index is 37.38 kg/m.   General: The patient is well-developed and well-nourished and in no acute distress   Neurologic Exam  Mental status: The patient is alert and oriented x 3 at the time of the examination. The patient has apparent normal recent and remote memory,  attention span and concentration ability now seem normal.   Speech is normal.     Cranial nerves: Extraocular movements are full. Facial strength and sensation is normal. No dysarthria is noted.  No obvious hearing deficits are noted.  Motor:  Muscle bulk is normal.   Tone is mildly increased in legs, left greater than right.. Strength is  5/5 in arms and legs now except 4+/5 left EHL.   Sensory: Sensory testing is intact to pinprick, soft touch and vibration sensation in arms and legs now.   Coordination: Cerebellar testing reveals good finger-nose-finger.   Heels to shin is reduced on the left   Gait and station: She has a normal station.  The gait is slightly wide though she has a mild left foot drop.  Tandem gait is wide.  Romberg is negative.  Reflexes: Deep tendon reflexes are increased bilaterally in legs.        DIAGNOSTIC DATA (LABS, IMAGING, TESTING) - I reviewed patient records, labs, notes, testing and imaging myself where  available.  Lab Results   Component Value Date   WBC 5.3 08/04/2020   HGB 12.1 08/04/2020   HCT 36.8 08/04/2020   MCV 94 08/04/2020   PLT 338 08/04/2020      Component Value Date/Time   NA 145 (H) 02/02/2020 1101   K 5.1 02/02/2020 1101   CL 104 02/02/2020 1101   CO2 26 02/02/2020 1101   GLUCOSE 78 02/02/2020 1101   GLUCOSE 84 09/23/2019 1118   BUN 10 02/02/2020 1101   CREATININE 0.77 02/02/2020 1101   CREATININE 0.82 09/23/2019 1118   CALCIUM 9.2 02/02/2020 1101   PROT 6.5 02/02/2020 1101   ALBUMIN 4.3 02/02/2020 1101   AST 14 02/02/2020 1101   ALT 9 02/02/2020 1101   ALKPHOS 84 02/02/2020 1101   BILITOT <0.2 02/02/2020 1101   GFRNONAA 94 02/02/2020 1101   GFRAA 108 02/02/2020 1101   Lab Results  Component Value Date   CHOL 143 10/30/2017   HDL 62 10/30/2017   LDLCALC 68 10/30/2017   TRIG 64 10/30/2017   CHOLHDL 2.3 10/30/2017   No results found for: HGBA1C Lab Results  Component Value Date   DEYCXKGY18 563 04/04/2015   Lab Results  Component Value Date   TSH 2.871 08/21/2011       ASSESSMENT AND PLAN  No diagnosis found.   1.  She will continue ocrelizumab for now..  The IgM and IgA were low .  We will recheck these if IgM drops more than half below the lower limit of normal we might need to consider a different DMT. 2.   Continue vitamin D supplements 3.   Stay active and try to lose weight if possible.   4.   Renew meds:  5.   Return in 6 months but call sooner if there are new or worsening neurologic symptoms.   Jackelyne Sayer A. Felecia Shelling, MD, PhD, FAAN Certified in Neurology, Clinical Neurophysiology, Sleep Medicine, Pain Medicine and Neuroimaging Director, [place] at [facility] [address] [place], [place] [address] [phone]

## 2021-02-10 ENCOUNTER — Ambulatory Visit: Payer: Medicare Other

## 2021-02-10 ENCOUNTER — Ambulatory Visit
Admission: RE | Admit: 2021-02-10 | Discharge: 2021-02-10 | Disposition: A | Discharge status: Home / Self Care | Payer: Medicare Other | Source: Ambulatory Visit | Attending: Internal Medicine | Admitting: Internal Medicine

## 2021-02-10 DIAGNOSIS — R928 Other abnormal and inconclusive findings on diagnostic imaging of breast: Secondary | ICD-10-CM

## 2021-02-10 LAB — CBC WITH DIFFERENTIAL/PLATELET
Basophils Absolute: 0.1 10*3/uL (ref 0.0–0.2)
Basos: 1 %
EOS (ABSOLUTE): 0.1 10*3/uL (ref 0.0–0.4)
Eos: 2 %
Hematocrit: 36.9 % (ref 34.0–46.6)
Hemoglobin: 12.1 g/dL (ref 11.1–15.9)
Immature Grans (Abs): 0 10*3/uL (ref 0.0–0.1)
Immature Granulocytes: 0 %
Lymphocytes Absolute: 1.1 10*3/uL (ref 0.7–3.1)
Lymphs: 22 %
MCH: 31.7 pg (ref 26.6–33.0)
MCHC: 32.8 g/dL (ref 31.5–35.7)
MCV: 97 fL (ref 79–97)
Monocytes Absolute: 0.4 10*3/uL (ref 0.1–0.9)
Monocytes: 9 %
Neutrophils Absolute: 3.1 10*3/uL (ref 1.4–7.0)
Neutrophils: 66 %
Platelets: 320 10*3/uL (ref 150–450)
RBC: 3.82 x10E6/uL (ref 3.77–5.28)
RDW: 12.6 % (ref 11.7–15.4)
WBC: 4.8 10*3/uL (ref 3.4–10.8)

## 2021-02-10 LAB — IGG, IGA, IGM
IgA/Immunoglobulin A, Serum: 40 mg/dL — ABNORMAL LOW (ref 87–352)
IgG (Immunoglobin G), Serum: 680 mg/dL (ref 586–1602)
IgM (Immunoglobulin M), Srm: 11 mg/dL — ABNORMAL LOW (ref 26–217)

## 2021-06-02 IMAGING — MG MM DIGITAL SCREENING BILAT W/ CAD
4 series · 4 of 4 positions shown · non-contrast
Comparison: Previous exam(s).

CLINICAL DATA: Screening.

EXAM:
DIGITAL SCREENING BILATERAL MAMMOGRAM WITH CAD

[L MLO]
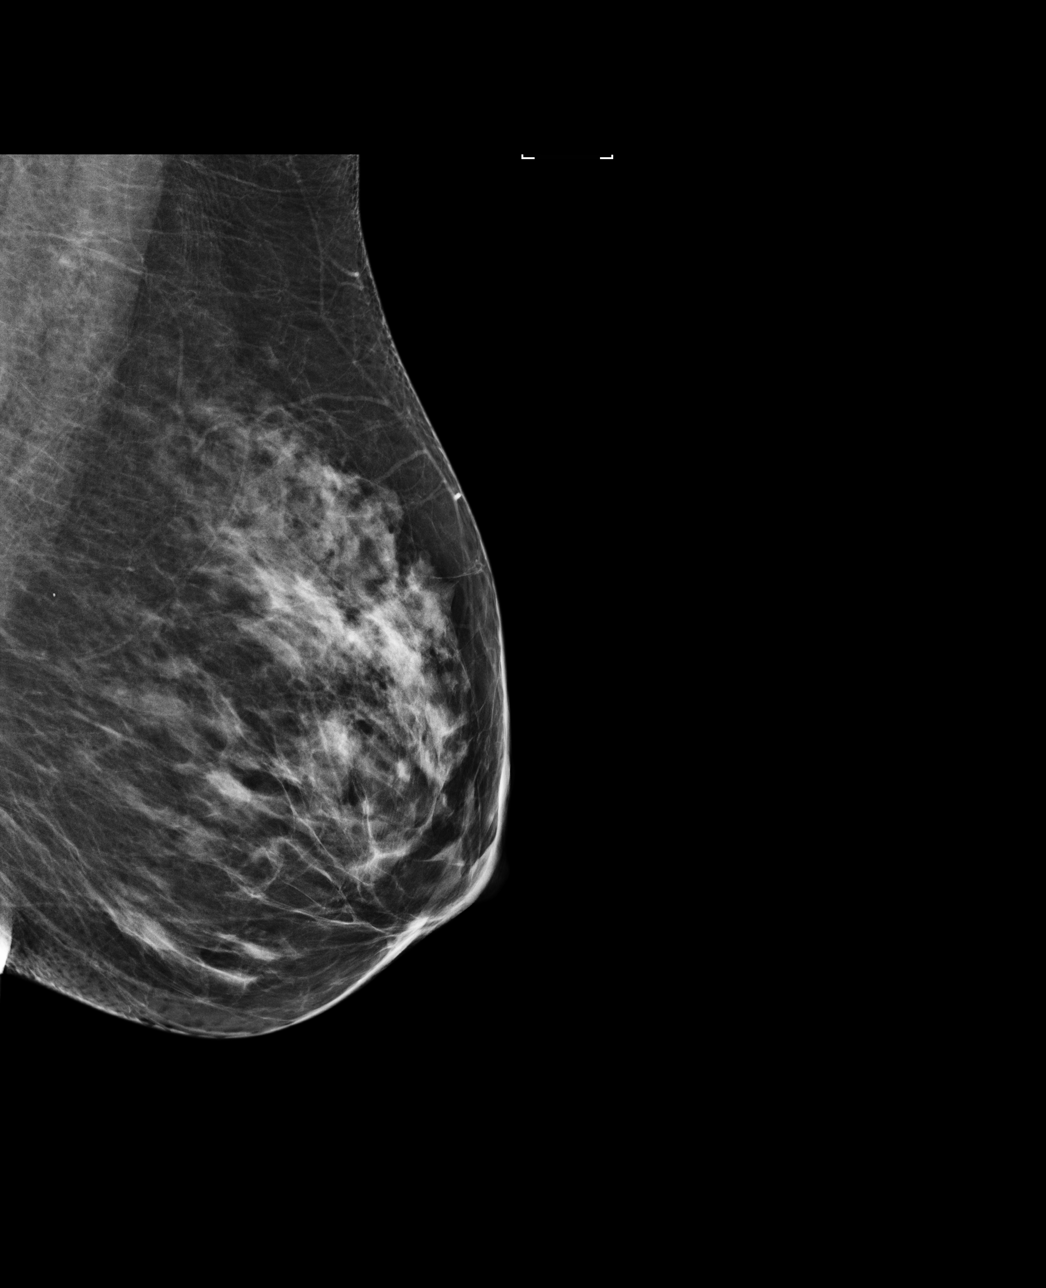

[L CC]
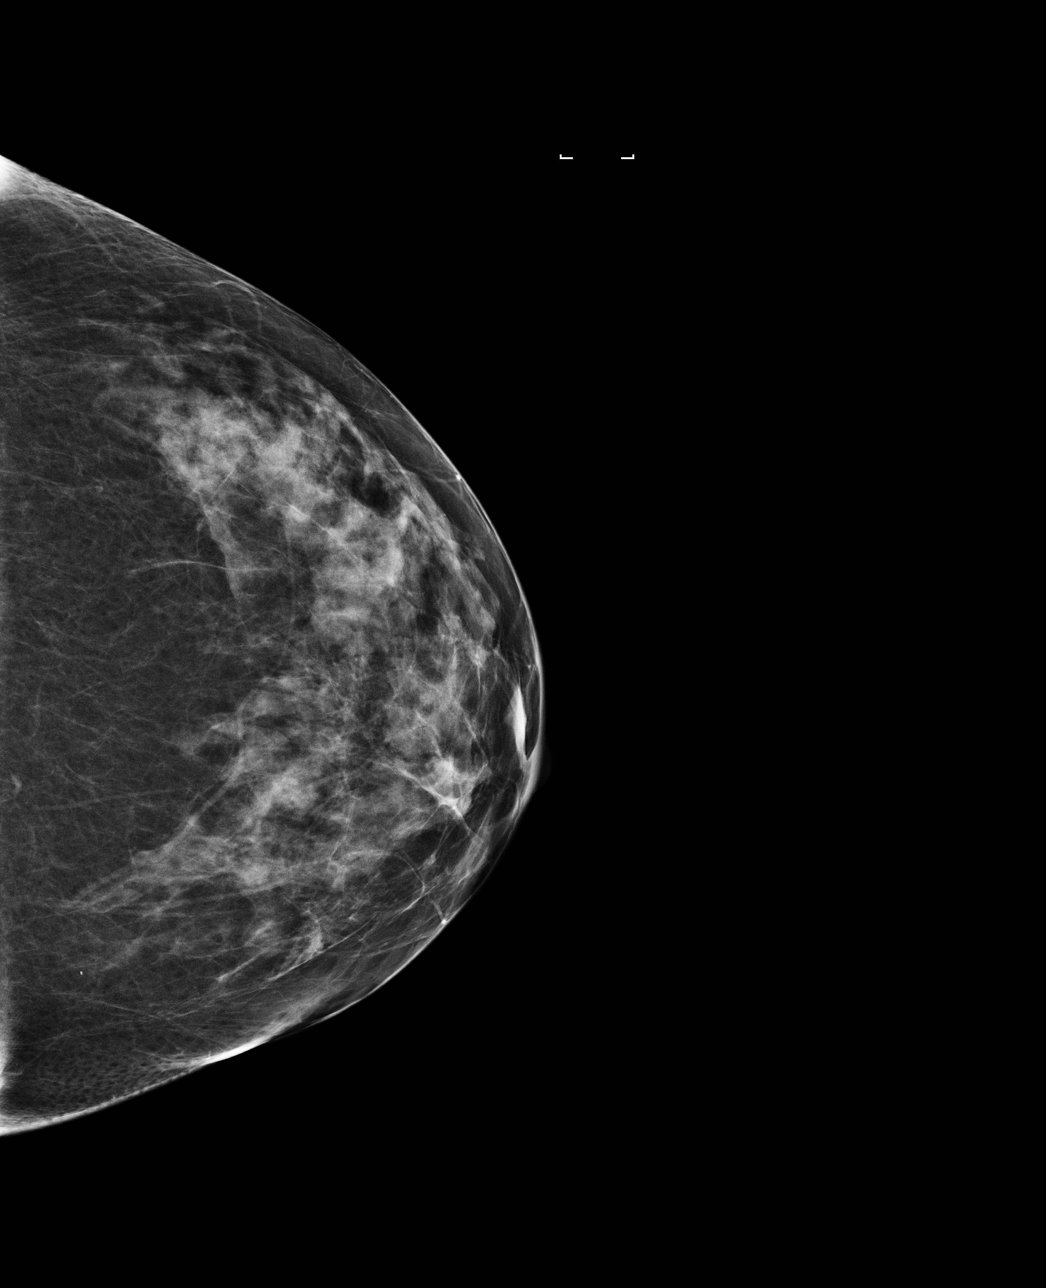

[R MLO]
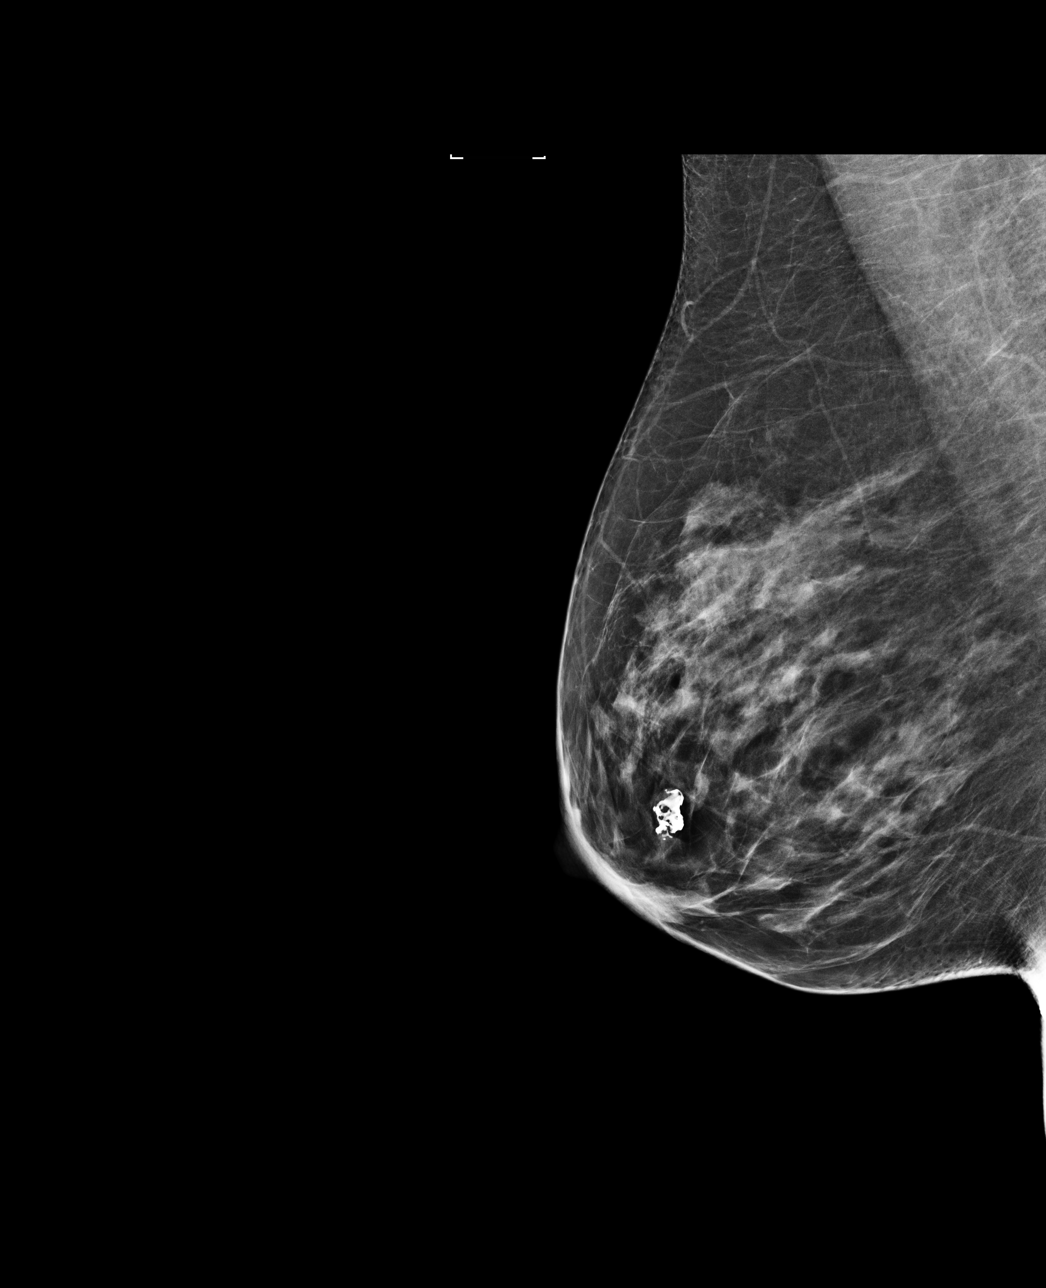

[R CC]
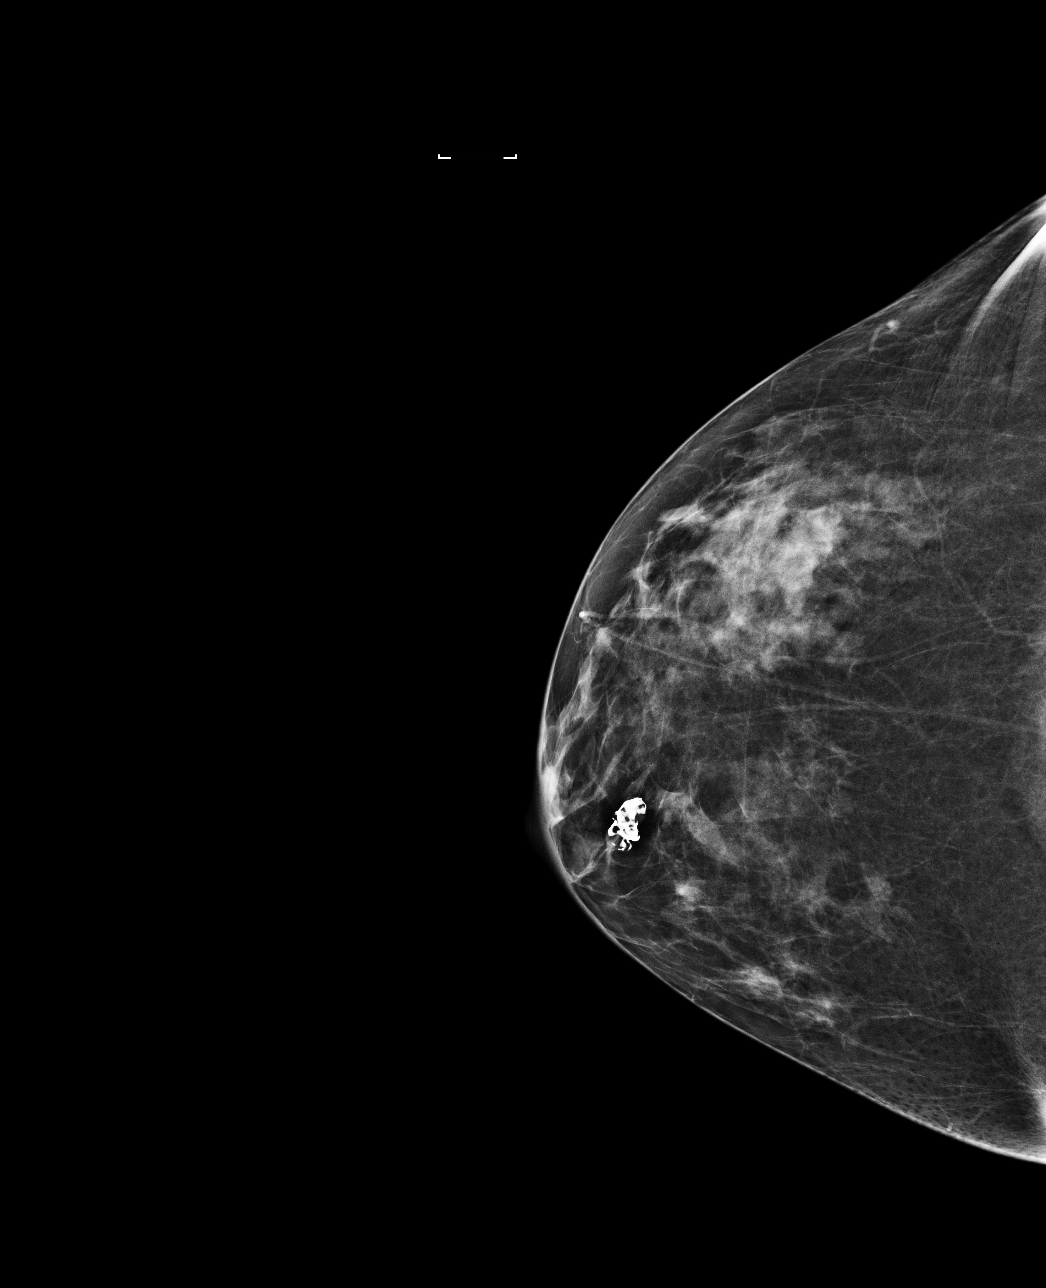

[4 of 4 positions shown; findings below may reference images not displayed]

ACR Breast Density Category c: The breast tissue is heterogeneously
dense, which may obscure small masses.
FINDINGS: There are no findings suspicious for malignancy. Images were
processed with CAD.
IMPRESSION: No mammographic evidence of malignancy. A result letter of this
screening mammogram will be mailed directly to the patient.

RECOMMENDATION:
Screening mammogram in one year. (Code:YJ-[id number])

BI-RADS CATEGORY  1: Negative.

## 2021-07-04 NOTE — Patient Instructions (Signed)

## 2021-07-04 NOTE — Progress Notes (Signed)
? ? ?Chief Complaint  ?Patient presents with  ? Follow-up  ?  RM 1, alone. MS DMT: Ocrevus. Tolerating well. Next infusion tomorrow.   ? ? ?HISTORY OF PRESENT ILLNESS: ? ?07/05/2021 ALL: ?Ann Oconnor returns for follow up for aggressive RRMS. She continues Ocrevus infusions every 8 months. Last IgM 11, IgA 40 Last infusion 11/03/2020, next scheduled 07/06/2021. Last MRI brain stable 12/2018. ? ?She continues to do well. She denies new or exacerbating symptoms. Gait is stable. No assistive device. No falls. Gabapentin continues to help with generalized pain and left leg numbness. Occasional increased weakness with increased stress or fatigue.  ? ?Mood is stable on Pristiq. She is sleeping well. No changes in bowel/bladder habits. ?  ?She continues vitamin D 5,000iu daily.  ? ?HISTORY (copied from Dr Garth Bigness previous note) ? ?Ann Oconnor is a 46 y.o. woman with aggressive relapsing remitting MS who was diagnosed in 2002.   She had a very severe series of exacerbations in 2016 after stopping Gilenya.    ?  ?Update 02/09/2021: ?She has been on Ocrevus --- her last infusion was September 2022 and next infusion is May, 2023.  Marland Kitchen   Her next infusion will be next week.   We stretched to every 8 months due to low IgM and IgA (IgG ws ok).  She asked about ofatumumab we discussed it is very similar to ocrelizumab though is self injectable.  We would still have to be concerned about low antibody levels. ?  ?Gait is doing well with one fall on stairs when she tripped.    She has a mild left  foot drop and some spasticity  She no longer has numbness or tingling.       Bladder function is good.      Arm strength is good.  She notes no difficulties with her vision.   ?  ?She has some fatigue but feels this is stable.  She sleeps well most nights.  Mood and cognition is doing well.   Pristiq ?  ?She has a breast U/S scheduled tomorrow --- had some asymmetry on mammogram with concern in right breast.   ?  ?MS:    She presented in 2002 with  numbness in her legs and poor gait.  The MRI scan showed MS plaques in the spinal cord and the brain and she was diagnosed with multiple sclerosis. She was initially placed on Rebif. At first she did well with only a couple of small central exacerbations. However, 2003 she had a more severe exacerbation that affected her gait. She was placed on a combination Rebif and Copaxone for a short period of time and then was on Copaxone monotherapy. She switched to [place] in 2011 due to needle fatigue. She had numbness in her feet that would not go away and was switched to Tecfidera from [place] in November 2015.  In January 2016, she lost strength in both legs and urinary incontinence. She had a course of IV steroids without any benefit in late January. Because there was no benefit, she was admitted to [facility] for a course of plasmapheresis starting February 5. She got no benefit from the plasmapheresis. During the next week, she was home but she was doing worse and worse. She then presented to [facility]. She was admitted for another week of steroids. An MRI done at admission showed very aggressive MS changes.    Brain 04/28/2014 shows multiple infratentorial plaques including left greater than right middle cerebellar peduncles, bilateral pons,  and cerebellum. Additionally there are multiple foci in the hemispheres, some with a concentric demyelinating pattern that could be consistent with Balo?s concentric sclerosis, an especially fulminant form of multiple sclerosis. She was unable to complete the examination or get contrast due to the need to terminate the study early.   When compared to an MRI dated 02/07/2012, there has been dramatic change with multiple new brain stem and hemispheric foci.  MRI of the cervical spine 10/26/2010. showed multiple plaques:  right at the C2 level, left aspect C2-3 level, right aspect C3 level, the posterior central aspect C3 level, upper C6 level slightly greater to the right and C7  level greater to the left.  Abnormal signal within the left aspect of the cord at the T3- T4 level.   MRI of the brain in 05/19/2014 showed several additional posterior fossa lesions prompting initiation of Novantrone.    After 5 cycles of Novantrone , she started Ocrevus in 2017. .    ?  ?MRI 12/13/2018 showed Multiple T2/flair hyperintense foci in the cerebellum, brainstem, thalamus and hemispheres in a pattern and configuration consistent with chronic demyelinating plaque associated with multiple sclerosis.  None of the foci appears to be acute and they do not enhance.  Compared to the MRI dated 11/06/2017, there are no new lesions. ? ? ?REVIEW OF SYSTEMS: Out of a complete 14 system review of symptoms, the patient complains only of the following symptoms, imbalance, anxiety/depression, numbness, left sided weakness and all other reviewed systems are negative. ? ? ?ALLERGIES: ?No Known Allergies ? ? ?HOME MEDICATIONS: ?Outpatient Medications Prior to Visit  ?Medication Sig Dispense Refill  ? Ascorbic Acid (VITAMIN C) 250 MG CHEW 1 tablet    ? Cholecalciferol (VITAMIN D PO) Take 5,000 Units by mouth daily.     ? desvenlafaxine (PRISTIQ) 100 MG 24 hr tablet TAKE 1 TABLET(100 MG) BY MOUTH DAILY 90 tablet 3  ? gabapentin (NEURONTIN) 400 MG capsule TAKE 1 CAPSULE(400 MG) BY MOUTH TWICE DAILY 180 capsule 3  ? ocrelizumab (OCREVUS) 300 MG/10ML injection See admin instructions.    ? acetaminophen (TYLENOL) 500 MG tablet Take 1,500 mg by mouth daily as needed for moderate pain or headache.    ? Multiple Vitamins-Minerals (ZINC PO) Take by mouth.    ? ?No facility-administered medications prior to visit.  ? ? ? ?PAST MEDICAL HISTORY: ?Past Medical History:  ?Diagnosis Date  ? Anxiety   ? Gait instability   ? Head injury, closed, with brief LOC ([place]) 2003  ? fall  ? Hyperlipidemia   ? Incontinence   ? LGSIL (low grade squamous intraepithelial dysplasia) 03/2015  ? positive high risk HPV subtype 18/45.  Colpo normal with neg  ECC  ? Movement disorder   ? MS (multiple sclerosis) ([place]) 2002  ? Neuromuscular disorder ([place])   ? MS  ? Seizures ([place]) 2003  ? none since 2003  ? UTI (lower urinary tract infection)   ? Vision abnormalities   ? ? ? ?PAST SURGICAL HISTORY: ?Past Surgical History:  ?Procedure Laterality Date  ? CERVICAL CONE BIOPSY  2008  ? CIN 2  ? COLONOSCOPY    ? 2010  ? PORT-A-CATH REMOVAL N/A 02/16/2016  ? Procedure: REMOVAL PORT-A-CATH;  Surgeon: Donnie Mesa, MD;  Location: [place];  Service: General;  Laterality: N/A;  ? PORTACATH PLACEMENT Left 08/18/2014  ? PORTACATH PLACEMENT Left 08/18/2014  ? Procedure: LEFT SUBCLAVIAN VEIN PORT PLACEMENT;  Surgeon: Donnie Mesa, MD;  Location: [place];  Service: General;  Laterality: Left;  ? ? ? ?FAMILY HISTORY: ?Family History  ?Problem Relation Age of Onset  ? Thyroid disease Mother   ? Heart disease Father   ? Mitral valve prolapse Father   ? Cancer Father   ?     lung/brain- tumors  ? Thyroid disease Sister   ? Thyroid disease Sister   ? Colon cancer Paternal Aunt   ? Cancer Paternal Aunt   ?     not sure what kind, maybe cervix  ? Breast cancer Neg Hx   ? Colon polyps Neg Hx   ? Esophageal cancer Neg Hx   ? Rectal cancer Neg Hx   ? Stomach cancer Neg Hx   ? ? ? ?SOCIAL HISTORY: ?Social History  ? ?Socioeconomic History  ? Marital status: Single  ?  Spouse name: Not on file  ? Number of children: Not on file  ? Years of education: Not on file  ? Highest education level: Not on file  ?Occupational History  ? Not on file  ?Tobacco Use  ? Smoking status: Never  ? Smokeless tobacco: Never  ?Vaping Use  ? Vaping Use: Never used  ?Substance and Sexual Activity  ? Alcohol use: No  ? Drug use: No  ? Sexual activity: Not Currently  ?  Comment: 1st intercourse- 23, partners- 3,   ?Other Topics Concern  ? Not on file  ?Social History Narrative  ? Not on file  ? ?Social Determinants of Health  ? ?Financial Resource Strain: Not on file  ?Food Insecurity: Not on file  ?Transportation Needs: Not  on file  ?Physical Activity: Not on file  ?Stress: Not on file  ?Social Connections: Not on file  ?Intimate Partner Violence: Not on file  ? ? ? ? ?PHYSICAL EXAM ? ?Vitals:  ? 07/05/21 1248  ?BP: 127/85  ?P

## 2021-07-05 ENCOUNTER — Ambulatory Visit: Payer: Medicare Other | Admitting: Family Medicine

## 2021-07-05 ENCOUNTER — Encounter: Payer: Self-pay | Admitting: Family Medicine

## 2021-07-05 VITALS — BP 127/85 | HR 84 | Ht 63.0 in | Wt 212.0 lb

## 2021-07-05 DIAGNOSIS — G35 Multiple sclerosis: Secondary | ICD-10-CM | POA: Diagnosis not present

## 2021-07-06 LAB — CBC WITH DIFFERENTIAL/PLATELET
Basophils Absolute: 0.1 10*3/uL (ref 0.0–0.2)
Basos: 1 %
EOS (ABSOLUTE): 0.1 10*3/uL (ref 0.0–0.4)
Eos: 2 %
Hematocrit: 35.7 % (ref 34.0–46.6)
Hemoglobin: 12.3 g/dL (ref 11.1–15.9)
Immature Grans (Abs): 0 10*3/uL (ref 0.0–0.1)
Immature Granulocytes: 0 %
Lymphocytes Absolute: 1.3 10*3/uL (ref 0.7–3.1)
Lymphs: 27 %
MCH: 32.1 pg (ref 26.6–33.0)
MCHC: 34.5 g/dL (ref 31.5–35.7)
MCV: 93 fL (ref 79–97)
Monocytes Absolute: 0.4 10*3/uL (ref 0.1–0.9)
Monocytes: 9 %
Neutrophils Absolute: 2.9 10*3/uL (ref 1.4–7.0)
Neutrophils: 61 %
Platelets: 338 10*3/uL (ref 150–450)
RBC: 3.83 x10E6/uL (ref 3.77–5.28)
RDW: 12.7 % (ref 11.7–15.4)
WBC: 4.8 10*3/uL (ref 3.4–10.8)

## 2021-07-06 LAB — IGG, IGA, IGM
IgA/Immunoglobulin A, Serum: 38 mg/dL — ABNORMAL LOW (ref 87–352)
IgG (Immunoglobin G), Serum: 678 mg/dL (ref 586–1602)
IgM (Immunoglobulin M), Srm: 9 mg/dL — ABNORMAL LOW (ref 26–217)

## 2021-08-17 ENCOUNTER — Ambulatory Visit: Payer: Medicare Other | Admitting: Family Medicine

## 2021-09-27 ENCOUNTER — Ambulatory Visit: Payer: Medicare Other | Admitting: Family Medicine

## 2021-10-09 ENCOUNTER — Encounter: Payer: Self-pay | Admitting: Neurology

## 2021-10-13 ENCOUNTER — Encounter: Payer: Self-pay | Admitting: Neurology

## 2021-10-16 ENCOUNTER — Ambulatory Visit: Payer: Medicare Other | Admitting: Neurology

## 2021-10-16 ENCOUNTER — Encounter: Payer: Self-pay | Admitting: Neurology

## 2021-10-16 VITALS — BP 95/61 | HR 88 | Ht 63.0 in | Wt 200.6 lb

## 2021-10-16 DIAGNOSIS — F418 Other specified anxiety disorders: Secondary | ICD-10-CM

## 2021-10-16 DIAGNOSIS — R200 Anesthesia of skin: Secondary | ICD-10-CM | POA: Diagnosis not present

## 2021-10-16 DIAGNOSIS — Z79899 Other long term (current) drug therapy: Secondary | ICD-10-CM

## 2021-10-16 DIAGNOSIS — G35 Multiple sclerosis: Secondary | ICD-10-CM

## 2021-10-16 DIAGNOSIS — R5383 Other fatigue: Secondary | ICD-10-CM

## 2021-10-16 NOTE — Progress Notes (Signed)
[facility]  PATIENT: Ann Oconnor DOB: March 26, 1975  REFERRING DOCTOR OR PCP:  Delia Chimes SOURCE: Patient, records, images  _________________________________   HISTORICAL  CHIEF COMPLAINT:  Chief Complaint  Patient presents with   Follow-up    Rm 85, w mother. Here to f/u for MS flare up. Having extreme fatigue and loss of balance. Pt had a UTI last week and treated. Currently needing to use a cane to ambulate. Having numbness in R hand, intermittently. Having SOB as well.     HISTORY OF PRESENT ILLNESS:  Ann Oconnor is a 46 y.o. woman with aggressive relapsing remitting MS who was diagnosed in 2002.   She had a very severe series of exacerbations in 2016 after stopping Gilenya.     Update 10/16/2021: She has had much more fatigue and more sleepiness over the last 2 weeks and also feels more off-balanced and has noted some right hand numbness   She was diagnosed with a UTI 2 weeks ago and was placed on macrodantin 100 mg po bid.   She was just rechecked and UA was clear.   She also was having some SOB and had a chest xray recently (reportedly normal)  This ws her first UTI in many years.   She denies urinary hesitancy or incomplete emptying.     She has been on Ocrevus --- her last infusion was May, 2023.   We stretched to every 8 months due to low IgM and IgA (IgG ws ok).  She asked about ofatumumab we discussed it is very similar to ocrelizumab though is self injectable.  We would still have to be concerned about low antibody levels.  Gait is doing well with one fall on stairs when she tripped.    She has a mild left  foot drop and some spasticity  She no longer has numbness or tingling.       Bladder function is good.      Arm strength is good.  She notes no difficulties with her vision.    She has some fatigue but feels this is stable.  She sleeps well most nights.  Mood and cognition is doing well.   Pristiq  She has a breast U/S scheduled tomorrow ---  had some asymmetry on mammogram with concern in right breast.      MS:    She presented in 2002 with numbness in her legs and poor gait.  The MRI scan showed MS plaques in the spinal cord and the brain and she was diagnosed with multiple sclerosis. She was initially placed on Rebif. At first she did well with only a couple of small central exacerbations. However, 2003 she had a more severe exacerbation that affected her gait. She was placed on a combination Rebif and Copaxone for a short period of time and then was on Copaxone monotherapy. She switched to [place] in 2011 due to needle fatigue. She had numbness in her feet that would not go away and was switched to Tecfidera from [place] in November 2015.  In January 2016, she lost strength in both legs and urinary incontinence. She had a course of IV steroids without any benefit in late January. Because there was no benefit, she was admitted to [facility] for a course of plasmapheresis starting February 5. She got no benefit from the plasmapheresis. During the next week, she was home but she was doing worse and worse. She then presented to [facility]. She was admitted for another week  of steroids. An MRI done at admission showed very aggressive MS changes.    Brain 04/28/2014 shows multiple infratentorial plaques including left greater than right middle cerebellar peduncles, bilateral pons, and cerebellum. Additionally there are multiple foci in the hemispheres, some with a concentric demyelinating pattern that could be consistent with Balo's concentric sclerosis, an especially fulminant form of multiple sclerosis. She was unable to complete the examination or get contrast due to the need to terminate the study early.   When compared to an MRI dated 02/07/2012, there has been dramatic change with multiple new brain stem and hemispheric foci.  MRI of the cervical spine 10/26/2010. showed multiple plaques:  right at the C2 level, left aspect C2-3 level, right  aspect C3 level, the posterior central aspect C3 level, upper C6 level slightly greater to the right and C7 level greater to the left.  Abnormal signal within the left aspect of the cord at the T3- T4 level.   MRI of the brain in 05/19/2014 showed several additional posterior fossa lesions prompting initiation of Novantrone.    After 5 cycles of Novantrone , she started Ocrevus in 2017. Marland Kitchen      MRI 12/13/2018 showed Multiple T2/flair hyperintense foci in the cerebellum, brainstem, thalamus and hemispheres in a pattern and configuration consistent with chronic demyelinating plaque associated with multiple sclerosis.  None of the foci appears to be acute and they do not enhance.  Compared to the MRI dated 11/06/2017, there are no new lesions.  REVIEW OF SYSTEMS: Constitutional: No fevers, chills, sweats, or change in appetite.  Fatigue Eyes: No visual changes, double vision, eye pain Ear, nose and throat: No hearing loss, ear pain, nasal congestion, sore throat Cardiovascular: No chest pain, palpitations Respiratory:  No shortness of breath at rest or with exertion.   No wheezes GastrointestinaI: No nausea, vomiting.  Some fecal incontinence Genitourinary:  as above. Musculoskeletal:  No neck pain, back pain Integumentary: No rash, pruritus, skin lesions Neurological: as above Psychiatric: Notes depression at this time.  Some anxiety Endocrine: No palpitations, diaphoresis, change in appetite, change in weigh or increased thirst Hematologic/Lymphatic:  No anemia, purpura, petechiae. Allergic/Immunologic: No itchy/runny eyes, nasal congestion, recent allergic reactions, rashes  ALLERGIES: No Known Allergies  HOME MEDICATIONS:  Current Outpatient Medications:    desvenlafaxine (PRISTIQ) 100 MG 24 hr tablet, TAKE 1 TABLET(100 MG) BY MOUTH DAILY, Disp: 90 tablet, Rfl: 3   gabapentin (NEURONTIN) 400 MG capsule, TAKE 1 CAPSULE(400 MG) BY MOUTH TWICE DAILY, Disp: 180 capsule, Rfl: 3   ocrelizumab  (OCREVUS) 300 MG/10ML injection, See admin instructions., Disp: , Rfl:   PAST MEDICAL HISTORY: Past Medical History:  Diagnosis Date   Anxiety    Gait instability    Head injury, closed, with brief LOC ([place]) 2003   fall   Hyperlipidemia    Incontinence    LGSIL (low grade squamous intraepithelial dysplasia) 03/2015   positive high risk HPV subtype 18/45.  Colpo normal with neg ECC   Movement disorder    MS (multiple sclerosis) ([place]) 2002   Neuromuscular disorder ([place])    MS   Seizures ([place]) 2003   none since 2003   UTI (lower urinary tract infection)    Vision abnormalities     PAST SURGICAL HISTORY: Past Surgical History:  Procedure Laterality Date   CERVICAL CONE BIOPSY  2008   CIN 2   COLONOSCOPY     2010   PORT-A-CATH REMOVAL N/A 02/16/2016   Procedure: REMOVAL PORT-A-CATH;  Surgeon: Rodman Key  [name], MD;  Location: [place];  Service: General;  Laterality: N/A;   PORTACATH PLACEMENT Left 08/18/2014   PORTACATH PLACEMENT Left 08/18/2014   Procedure: LEFT SUBCLAVIAN VEIN PORT PLACEMENT;  Surgeon: Donnie Mesa, MD;  Location: [place];  Service: General;  Laterality: Left;    FAMILY HISTORY: Family History  Problem Relation Age of Onset   Thyroid disease Mother    Heart disease Father    Mitral valve prolapse Father    Cancer Father        lung/brain- tumors   Thyroid disease Sister    Thyroid disease Sister    Colon cancer Paternal Aunt    Cancer Paternal Aunt        not sure what kind, maybe cervix   Breast cancer Neg Hx    Colon polyps Neg Hx    Esophageal cancer Neg Hx    Rectal cancer Neg Hx    Stomach cancer Neg Hx     SOCIAL HISTORY:  Social History   Socioeconomic History   Marital status: Single    Spouse name: Not on file   Number of children: Not on file   Years of education: Not on file   Highest education level: Not on file  Occupational History   Not on file  Tobacco Use   Smoking status: Never   Smokeless tobacco: Never  Vaping Use    Vaping Use: Never used  Substance and Sexual Activity   Alcohol use: No   Drug use: No   Sexual activity: Not Currently    Comment: 1st intercourse- 23, partners- 3,   Other Topics Concern   Not on file  Social History Narrative   Not on file   Social Determinants of Health   Financial Resource Strain: Not on file  Food Insecurity: Not on file  Transportation Needs: Not on file  Physical Activity: Not on file  Stress: Not on file  Social Connections: Not on file  Intimate Partner Violence: Not on file     PHYSICAL EXAM  Vitals:   10/16/21 1440  BP: 95/61  Pulse: 88  Weight: 200 lb 9.6 oz (91 kg)  Height: '5\' 3"'$  (1.6 m)    Body mass index is 35.53 kg/m.   General: The patient is well-developed and well-nourished and in no acute distress   Neurologic Exam  Mental status: The patient is alert and oriented x 3 at the time of the examination. The patient has apparent normal recent and remote memory,  attention span and concentration ability now seem normal.   Speech is normal.     Cranial nerves: Extraocular movements are full. Facial strength and sensation is normal. No dysarthria is noted.  No obvious hearing deficits are noted.  Motor:  Muscle bulk is normal.   Tone is mildly increased in legs, left greater than right.. Strength is  5/5 in arms and legs now except 4+/5 left EHL.   Sensory: Sensory testing is intact to pinprick, soft touch and vibration sensation in arms and legs now.   Coordination: Cerebellar testing reveals good finger-nose-finger.   Heels to shin is reduced on the left   Gait and station: She has a normal station.  The gait is wide and she has a mild left foot drop.  She walks better with a cane and without it.  Tandem gait is wide.  Romberg is negative.  Reflexes: Deep tendon reflexes are increased bilaterally in legs.        DIAGNOSTIC DATA (LABS,  IMAGING, TESTING) - I reviewed patient records, labs, notes, testing and imaging myself where  available.  Lab Results  Component Value Date   WBC 4.8 07/05/2021   HGB 12.3 07/05/2021   HCT 35.7 07/05/2021   MCV 93 07/05/2021   PLT 338 07/05/2021      Component Value Date/Time   NA 145 (H) 02/02/2020 1101   K 5.1 02/02/2020 1101   CL 104 02/02/2020 1101   CO2 26 02/02/2020 1101   GLUCOSE 78 02/02/2020 1101   GLUCOSE 84 09/23/2019 1118   BUN 10 02/02/2020 1101   CREATININE 0.77 02/02/2020 1101   CREATININE 0.82 09/23/2019 1118   CALCIUM 9.2 02/02/2020 1101   PROT 6.5 02/02/2020 1101   ALBUMIN 4.3 02/02/2020 1101   AST 14 02/02/2020 1101   ALT 9 02/02/2020 1101   ALKPHOS 84 02/02/2020 1101   BILITOT <0.2 02/02/2020 1101   GFRNONAA 94 02/02/2020 1101   GFRAA 108 02/02/2020 1101   Lab Results  Component Value Date   CHOL 143 10/30/2017   HDL 62 10/30/2017   LDLCALC 68 10/30/2017   TRIG 64 10/30/2017   CHOLHDL 2.3 10/30/2017   No results found for: "HGBA1C" Lab Results  Component Value Date   VITAMINB12 348 04/04/2015   Lab Results  Component Value Date   TSH 2.871 08/21/2011       ASSESSMENT AND PLAN  Multiple sclerosis ([place]) - Plan: MR BRAIN W WO CONTRAST, MR CERVICAL SPINE W WO CONTRAST  Depression with anxiety  High risk medication use  Numbness - Plan: MR BRAIN W WO CONTRAST, MR CERVICAL SPINE W WO CONTRAST  Other fatigue   1.  She will continue ocrelizumab for now. We are doing every 8 months due to low IgA and IgM.  Possible small exacerbation vs pseudo-exacerbation from UTI - we wil do 3 days IV Solumedrol 1 g /d.    Check MRI brain and cervical spine.  If significant breakthrough activity, consider Tysabri (was JCV negative in 2021) 2.   Continue vitamin D supplements 3.   Stay active and try to lose weight if possible.   4.   Renew meds.   5.   Return in 6 months but call sooner if there are new or worsening neurologic symptoms.  40-minute office visit with the majority of the time spent face-to-face for history and physical,  discussion/counseling and decision-making.  Additional time with record review and documentation.   Warren Lindahl A. Felecia Shelling, MD, PhD, FAAN Certified in Neurology, Clinical Neurophysiology, Sleep Medicine, Pain Medicine and Neuroimaging Director, [place] [facility] [address], [place], [place] [address] [phone]

## 2021-10-17 ENCOUNTER — Telehealth: Payer: Self-pay | Admitting: Neurology

## 2021-10-17 ENCOUNTER — Ambulatory Visit: Payer: Medicare Other | Admitting: Nurse Practitioner

## 2021-10-17 NOTE — Telephone Encounter (Signed)
[employer] medicare order sent to [facility], [facility] they will reach out to the patient to schedule.  

## 2021-10-22 ENCOUNTER — Encounter: Payer: Self-pay | Admitting: Neurology

## 2021-10-30 ENCOUNTER — Other Ambulatory Visit: Payer: Medicare Other

## 2021-11-05 ENCOUNTER — Ambulatory Visit
Admission: RE | Admit: 2021-11-05 | Discharge: 2021-11-05 | Disposition: A | Discharge status: Home / Self Care | Payer: Medicare Other | Source: Ambulatory Visit | Attending: Neurology | Admitting: Neurology

## 2021-11-05 DIAGNOSIS — G35 Multiple sclerosis: Secondary | ICD-10-CM

## 2021-11-05 DIAGNOSIS — R200 Anesthesia of skin: Secondary | ICD-10-CM

## 2021-11-05 MED ORDER — GADOPICLENOL 0.5 MMOL/ML IV SOLN
9.0000 mL | Freq: Once | INTRAVENOUS | Status: AC | PRN
  Administered 2021-11-05: 9 mL via INTRAVENOUS

## 2021-11-16 ENCOUNTER — Ambulatory Visit: Payer: Medicare Other | Admitting: Nurse Practitioner

## 2021-11-21 ENCOUNTER — Other Ambulatory Visit (HOSPITAL_COMMUNITY)
Admission: RE | Admit: 2021-11-21 | Discharge: 2021-11-21 | Disposition: A | Discharge status: Home / Self Care | Payer: Medicare Other | Source: Ambulatory Visit | Attending: Nurse Practitioner | Admitting: Nurse Practitioner

## 2021-11-21 ENCOUNTER — Encounter: Payer: Self-pay | Admitting: Nurse Practitioner

## 2021-11-21 ENCOUNTER — Ambulatory Visit (INDEPENDENT_AMBULATORY_CARE_PROVIDER_SITE_OTHER): Payer: Medicare Other | Admitting: Nurse Practitioner

## 2021-11-21 VITALS — BP 124/86 | HR 68 | Resp 14 | Ht 62.5 in | Wt 195.0 lb

## 2021-11-21 DIAGNOSIS — Z1151 Encounter for screening for human papillomavirus (HPV): Secondary | ICD-10-CM | POA: Insufficient documentation

## 2021-11-21 DIAGNOSIS — R87612 Low grade squamous intraepithelial lesion on cytologic smear of cervix (LGSIL): Secondary | ICD-10-CM | POA: Diagnosis not present

## 2021-11-21 DIAGNOSIS — Z8262 Family history of osteoporosis: Secondary | ICD-10-CM

## 2021-11-21 DIAGNOSIS — Z9189 Other specified personal risk factors, not elsewhere classified: Secondary | ICD-10-CM | POA: Diagnosis not present

## 2021-11-21 DIAGNOSIS — Z780 Asymptomatic menopausal state: Secondary | ICD-10-CM | POA: Diagnosis not present

## 2021-11-21 DIAGNOSIS — Z01419 Encounter for gynecological examination (general) (routine) without abnormal findings: Secondary | ICD-10-CM

## 2021-11-21 NOTE — Progress Notes (Signed)
Ann Oconnor November 28, 1975 [id number]   History:  46 y.o. G0 presents for breast and pelvic exam without GYN complaints. See pap history below. 2008 LEEP HGSIL, H/O persistent LGSIL, declines colposcopy and wants to monitor with paps at this time. History of vitamin D deficiency, HLD, MS (diagnosed in 2016). Early menopause at age 81 due to MS medication, elevated FSH at that time. Denies menopausal symptoms. Not sexually active.   2008 LEEP for HGSIL 2017 pap LGSIL + HPV, negative biopsy 2019 LGSIL neg HR HPV, CIN-1 2020/2021 LGSIL neg HR HPV 03/2020 LGSIL neg HR HPV - patient opted out of colpo 11/2020 ASCUS neg HR HPV  Gynecologic History Patient's last menstrual period was 07/19/2014.   Contraception: post menopausal status Sexually active: No  Health maintenance Last Pap: 11/15/2020. Results were: ASCUS negative high risk HPV Last mammogram: 01/12/2021. Results were: right asymmetry, diagnostic 02/2021 normal Last colonoscopy: 09/06/2020. Results were: Benign polyp, 10-year recall Last Dexa: Not indicated  Past medical history, past surgical history, family history and social history were all reviewed and documented in the EPIC chart. Single. 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 Mother with osteoporosis.   ROS:  A ROS was performed and pertinent positives and negatives are included.  Exam:  Vitals:   11/21/21 0943  BP: 124/86  Pulse: 68  Resp: 14  Weight: 195 lb (88.5 kg)  Height: 5' 2.5" (1.588 m)    Body mass index is 35.1 kg/m.  General appearance:  Normal Thyroid:  Symmetrical, normal in size, without palpable masses or nodularity. Respiratory  Auscultation:  Clear without wheezing or rhonchi Cardiovascular  Auscultation:  Regular rate, without rubs, murmurs or gallops  Edema/varicosities:  Not grossly evident Abdominal  Soft,nontender, without masses, guarding or rebound.  Liver/spleen:  No organomegaly noted  Hernia:  None appreciated  Skin  Inspection:  Grossly  normal   Breasts: Examined lying and sitting.   Right: Without masses, retractions, discharge or axillary adenopathy.   Left: Without masses, retractions, discharge or axillary adenopathy. Genitourinary   Inguinal/mons:  Normal without inguinal adenopathy  External genitalia:  Normal appearing vulva with no masses, tenderness, or lesions  BUS/Urethra/Skene's glands:  Normal  Vagina:  Normal appearing with normal color and discharge, no lesions  Cervix:  Normal appearing without discharge or lesions  Uterus:  Normal in size, shape and contour.  Midline and mobile, nontender  Adnexa/parametria:     Rt: Normal in size, without masses or tenderness.   Lt: Normal in size, without masses or tenderness.  Anus and perineum: Normal  Digital rectal exam: Normal sphincter tone without palpated masses or tenderness  Patient informed chaperone available to be present for breast and pelvic exam. Patient has requested no chaperone to be present. Patient has been advised what will be completed during breast and pelvic exam.    Assessment/Plan:  46 y.o. G for breast and pelvic exam.  Well female exam with routine gynecological exam - Plan: Cytology - PAP( [place]).  Education provided on SBEs, importance of preventative screenings, current guidelines, high calcium diet, regular exercise, and multivitamin daily. Labs with PCP.   Family history of osteoporosis in mother - plans to discuss DXA with PCP. Recommend baseline DXA due to mother's history and personal use of steroids and immunosuppressants since MS diagnosis.   Postmenopausal - medication-induced at age 24. No HRT, denies symptoms.   Screening for cervical cancer - Abnormal pap history. 2008 LEEP HGSIL. H/O persistent LGSIL 2017-January 2022, 11/2020 ASCUS neg HR HPV.  Pap today.   Screening for breast cancer - Normal mammogram history.  Continue annual screenings.  Normal breast exam today.  Screening for colon cancer - Will repeat at  -year interval per GI's recommendation.   Screening for osteoporosis - Average risk. Will plan DXA at age 65.    Screening for breast cancer - Normal mammogram history.  Continue annual screenings.  Normal breast exam today.  Screening for colon cancer - 09/2020 colonoscopy. Will repeat at GI's recommended interval.   Follow up in 1 year for annual.      Tamela Gammon [facility], 10:04 AM 11/21/2021

## 2021-11-23 ENCOUNTER — Encounter: Payer: Self-pay | Admitting: Family Medicine

## 2021-11-23 ENCOUNTER — Other Ambulatory Visit: Payer: Self-pay | Admitting: Nurse Practitioner

## 2021-11-23 DIAGNOSIS — N760 Acute vaginitis: Secondary | ICD-10-CM

## 2021-11-23 LAB — CYTOLOGY - PAP
Adequacy: ABSENT
Comment: NEGATIVE
Diagnosis: NEGATIVE
Diagnosis: REACTIVE
High risk HPV: NEGATIVE

## 2021-11-23 MED ORDER — METRONIDAZOLE 0.75 % VA GEL: 1.0000 | Freq: Every day | VAGINAL | 0 refills | Status: AC

## 2022-01-01 ENCOUNTER — Other Ambulatory Visit: Payer: Self-pay | Admitting: Internal Medicine

## 2022-01-01 DIAGNOSIS — Z1231 Encounter for screening mammogram for malignant neoplasm of breast: Secondary | ICD-10-CM

## 2022-01-12 NOTE — Patient Instructions (Signed)

## 2022-01-12 NOTE — Progress Notes (Unsigned)
No chief complaint on file.   HISTORY OF PRESENT ILLNESS:  01/15/2022 ALL: Ann Oconnor returns for follow up for aggressive RRMS. She continues Ocrevus infusions every 8 months. Last IgM 11, IgA 40 Last infusion 11/03/2020, next scheduled 07/06/2021. Last MRI brain stable 12/2018.  She continues to do well. She denies new or exacerbating symptoms. Gait is stable. No assistive device. No falls. Gabapentin continues to help with generalized pain and left leg numbness. Occasional increased weakness with increased stress or fatigue.   Mood is stable on Pristiq. She is sleeping well. No changes in bowel/bladder habits.   She continues vitamin D 5,000iu daily.   HISTORY (copied from Dr Garth Bigness previous note)  Ann Oconnor is a 46 y.o. woman with aggressive relapsing remitting MS who was diagnosed in 2002.   She had a very severe series of exacerbations in 2016 after stopping Gilenya.      Update 02/09/2021: She has been on Ocrevus --- her last infusion was September 2022 and next infusion is May, 2023.  Marland Kitchen   Her next infusion will be next week.   We stretched to every 8 months due to low IgM and IgA (IgG ws ok).  She asked about ofatumumab we discussed it is very similar to ocrelizumab though is self injectable.  We would still have to be concerned about low antibody levels.   Gait is doing well with one fall on stairs when she tripped.    She has a mild left  foot drop and some spasticity  She no longer has numbness or tingling.       Bladder function is good.      Arm strength is good.  She notes no difficulties with her vision.     She has some fatigue but feels this is stable.  She sleeps well most nights.  Mood and cognition is doing well.   Pristiq   She has a breast U/S scheduled tomorrow --- had some asymmetry on mammogram with concern in right breast.     MS:    She presented in 2002 with numbness in her legs and poor gait.  The MRI scan showed MS plaques in the spinal cord and the brain and  she was diagnosed with multiple sclerosis. She was initially placed on Rebif. At first she did well with only a couple of small central exacerbations. However, 2003 she had a more severe exacerbation that affected her gait. She was placed on a combination Rebif and Copaxone for a short period of time and then was on Copaxone monotherapy. She switched to [place] in 2011 due to needle fatigue. She had numbness in her feet that would not go away and was switched to Tecfidera from [place] in November 2015.  In January 2016, she lost strength in both legs and urinary incontinence. She had a course of IV steroids without any benefit in late January. Because there was no benefit, she was admitted to [facility] for a course of plasmapheresis starting February 5. She got no benefit from the plasmapheresis. During the next week, she was home but she was doing worse and worse. She then presented to [facility]. She was admitted for another week of steroids. An MRI done at admission showed very aggressive MS changes.    Brain 04/28/2014 shows multiple infratentorial plaques including left greater than right middle cerebellar peduncles, bilateral pons, and cerebellum. Additionally there are multiple foci in the hemispheres, some with a concentric demyelinating pattern that could be consistent  with Balo's concentric sclerosis, an especially fulminant form of multiple sclerosis. She was unable to complete the examination or get contrast due to the need to terminate the study early.   When compared to an MRI dated 02/07/2012, there has been dramatic change with multiple new brain stem and hemispheric foci.  MRI of the cervical spine 10/26/2010. showed multiple plaques:  right at the C2 level, left aspect C2-3 level, right aspect C3 level, the posterior central aspect C3 level, upper C6 level slightly greater to the right and C7 level greater to the left.  Abnormal signal within the left aspect of the cord at the T3- T4 level.    MRI of the brain in 05/19/2014 showed several additional posterior fossa lesions prompting initiation of Novantrone.    After 5 cycles of Novantrone , she started Ocrevus in 2017. Marland Kitchen      MRI 12/13/2018 showed Multiple T2/flair hyperintense foci in the cerebellum, brainstem, thalamus and hemispheres in a pattern and configuration consistent with chronic demyelinating plaque associated with multiple sclerosis.  None of the foci appears to be acute and they do not enhance.  Compared to the MRI dated 11/06/2017, there are no new lesions.   REVIEW OF SYSTEMS: Out of a complete 14 system review of symptoms, the patient complains only of the following symptoms, imbalance, anxiety/depression, numbness, left sided weakness and all other reviewed systems are negative.   ALLERGIES: No Known Allergies   HOME MEDICATIONS: Outpatient Medications Prior to Visit  Medication Sig Dispense Refill   desvenlafaxine (PRISTIQ) 100 MG 24 hr tablet TAKE 1 TABLET(100 MG) BY MOUTH DAILY 90 tablet 3   gabapentin (NEURONTIN) 400 MG capsule TAKE 1 CAPSULE(400 MG) BY MOUTH TWICE DAILY 180 capsule 3   ocrelizumab (OCREVUS) 300 MG/10ML injection See admin instructions.     No facility-administered medications prior to visit.     PAST MEDICAL HISTORY: Past Medical History:  Diagnosis Date   Anxiety    Gait instability    Head injury, closed, with brief LOC ([place]) 2003   fall   Hyperlipidemia    Incontinence    LGSIL (low grade squamous intraepithelial dysplasia) 03/2015   positive high risk HPV subtype 18/45.  Colpo normal with neg ECC   Movement disorder    MS (multiple sclerosis) ([place]) 2002   Neuromuscular disorder ([place])    MS   Seizures ([place]) 2003   none since 2003   UTI (lower urinary tract infection)    Vision abnormalities      PAST SURGICAL HISTORY: Past Surgical History:  Procedure Laterality Date   CERVICAL CONE BIOPSY  2008   CIN 2   COLONOSCOPY     2010   PORT-A-CATH REMOVAL N/A 02/16/2016    Procedure: REMOVAL PORT-A-CATH;  Surgeon: Donnie Mesa, MD;  Location: [place];  Service: General;  Laterality: N/A;   PORTACATH PLACEMENT Left 08/18/2014   PORTACATH PLACEMENT Left 08/18/2014   Procedure: LEFT SUBCLAVIAN VEIN PORT PLACEMENT;  Surgeon: Donnie Mesa, MD;  Location: [facility];  Service: General;  Laterality: Left;     FAMILY HISTORY: Family History  Problem Relation Age of Onset   Thyroid disease Mother    Heart disease Father    Mitral valve prolapse Father    Cancer Father        lung/brain- tumors   Thyroid disease Sister    Thyroid disease Sister    Colon cancer Paternal Aunt    Cancer Paternal Aunt        not  sure what kind, maybe cervix   Breast cancer Neg Hx    Colon polyps Neg Hx    Esophageal cancer Neg Hx    Rectal cancer Neg Hx    Stomach cancer Neg Hx      SOCIAL HISTORY: Social History   Socioeconomic History   Marital status: Single    Spouse name: Not on file   Number of children: Not on file   Years of education: Not on file   Highest education level: Not on file  Occupational History   Not on file  Tobacco Use   Smoking status: Never   Smokeless tobacco: Never  Vaping Use   Vaping Use: Never used  Substance and Sexual Activity   Alcohol use: No   Drug use: No   Sexual activity: Not Currently    Comment: 1st intercourse- 23, partners- 3,   Other Topics Concern   Not on file  Social History Narrative   Not on file   Social Determinants of Health   Financial Resource Strain: Not on file  Food Insecurity: Not on file  Transportation Needs: Not on file  Physical Activity: Not on file  Stress: Not on file  Social Connections: Not on file  Intimate Partner Violence: Not on file      PHYSICAL EXAM  There were no vitals filed for this visit.   There is no height or weight on file to calculate BMI.   Generalized: Well developed, in no acute distress  Cardiology: normal rate and rhythm, no murmur auscultated   Respiratory: clear to auscultation bilaterally    Neurological examination  Mentation: Alert oriented to time, place, history taking. Follows all commands speech and language fluent Cranial nerve II-XII: Pupils were equal round reactive to light. Extraocular movements were full, visual field were full on confrontational test. Facial sensation and strength were normal. Head turning and shoulder shrug  were normal and symmetric. Motor: The motor testing reveals 5 over 5 strength of all 4 extremities with exception of 4+/5 left hip flexion.  Sensory: Sensory testing is intact to soft touch on all 4 extremities. No evidence of extinction is noted.  Coordination: Cerebellar testing reveals good finger-nose-finger and heel-to-shin bilaterally.  Gait and station: Gait is stable with no assistive device, no obvious footdrop noted today, tandem is unsteady.  Reflexes diminished in bilateral patellar but symmetric     DIAGNOSTIC DATA (LABS, IMAGING, TESTING) - I reviewed patient records, labs, notes, testing and imaging myself where available.  Lab Results  Component Value Date   WBC 4.8 07/05/2021   HGB 12.3 07/05/2021   HCT 35.7 07/05/2021   MCV 93 07/05/2021   PLT 338 07/05/2021      Component Value Date/Time   NA 145 (H) 02/02/2020 1101   K 5.1 02/02/2020 1101   CL 104 02/02/2020 1101   CO2 26 02/02/2020 1101   GLUCOSE 78 02/02/2020 1101   GLUCOSE 84 09/23/2019 1118   BUN 10 02/02/2020 1101   CREATININE 0.77 02/02/2020 1101   CREATININE 0.82 09/23/2019 1118   CALCIUM 9.2 02/02/2020 1101   PROT 6.5 02/02/2020 1101   ALBUMIN 4.3 02/02/2020 1101   AST 14 02/02/2020 1101   ALT 9 02/02/2020 1101   ALKPHOS 84 02/02/2020 1101   BILITOT <0.2 02/02/2020 1101   GFRNONAA 94 02/02/2020 1101   GFRAA 108 02/02/2020 1101   Lab Results  Component Value Date   CHOL 143 10/30/2017   HDL 62 10/30/2017   LDLCALC 68 10/30/2017  TRIG 64 10/30/2017   CHOLHDL 2.3 10/30/2017   No results  found for: "HGBA1C" Lab Results  Component Value Date   VITAMINB12 348 04/04/2015   Lab Results  Component Value Date   TSH 2.871 08/21/2011      ASSESSMENT AND PLAN  46 y.o. year old female  has a past medical history of Anxiety, Gait instability, Head injury, closed, with brief LOC ([place]) (2003), Hyperlipidemia, Incontinence, LGSIL (low grade squamous intraepithelial dysplasia) (03/2015), Movement disorder, MS (multiple sclerosis) ([place]) (2002), Neuromuscular disorder ([place]), Seizures ([place]) (2003), UTI (lower urinary tract infection), and Vision abnormalities. here with   No diagnosis found.   Ann Oconnor is doing well, today. We will continue current treatment plan. She has scheduled Ocrevus infusion tomorrow. Will contnue every 8 months due to low IgA, IgM. MRI stable 12/2018, no new or worsening symptoms. She does not wish to update imaging. I will update labs, today. She will continue gabapentin, Pristiq and vitamin D supplements. She will continue healthy lifestyle habits. Fall precautions reviewed. She will follow up with me in 6 months, sooner if needed pending labs. Will anticipate follow up with Dr Felecia Shelling this time next year. She verbalizes understanding and agreement with this plan.    Debbora Presto, MSN, FNP-C 01/12/2022, 9:14 AM  [facility] [address], [place], [place] [address] [phone]

## 2022-01-15 ENCOUNTER — Encounter: Payer: Self-pay | Admitting: Family Medicine

## 2022-01-15 ENCOUNTER — Ambulatory Visit: Payer: Medicare Other | Admitting: Family Medicine

## 2022-01-15 VITALS — BP 99/70 | HR 90 | Ht 63.0 in | Wt 197.5 lb

## 2022-01-15 DIAGNOSIS — G35 Multiple sclerosis: Secondary | ICD-10-CM

## 2022-01-15 DIAGNOSIS — R5383 Other fatigue: Secondary | ICD-10-CM | POA: Diagnosis not present

## 2022-01-15 DIAGNOSIS — F418 Other specified anxiety disorders: Secondary | ICD-10-CM | POA: Diagnosis not present

## 2022-01-15 DIAGNOSIS — Z79899 Other long term (current) drug therapy: Secondary | ICD-10-CM

## 2022-01-15 DIAGNOSIS — E559 Vitamin D deficiency, unspecified: Secondary | ICD-10-CM

## 2022-01-15 MED ORDER — GABAPENTIN 400 MG PO CAPS
ORAL_CAPSULE | ORAL | 3 refills | Status: DC
Start: 2022-01-15 — End: 2023-02-04

## 2022-01-15 MED ORDER — DESVENLAFAXINE SUCCINATE ER 100 MG PO TB24
ORAL_TABLET | ORAL | 3 refills | Status: DC
Start: 2022-01-15 — End: 2023-02-01

## 2022-01-16 ENCOUNTER — Encounter: Payer: Self-pay | Admitting: *Deleted

## 2022-01-16 LAB — CBC WITH DIFFERENTIAL/PLATELET
Basophils Absolute: 0.1 10*3/uL (ref 0.0–0.2)
Basos: 1 %
EOS (ABSOLUTE): 0.1 10*3/uL (ref 0.0–0.4)
Eos: 2 %
Hematocrit: 34.2 % (ref 34.0–46.6)
Hemoglobin: 11.3 g/dL (ref 11.1–15.9)
Immature Grans (Abs): 0 10*3/uL (ref 0.0–0.1)
Immature Granulocytes: 0 %
Lymphocytes Absolute: 0.8 10*3/uL (ref 0.7–3.1)
Lymphs: 16 %
MCH: 29.4 pg (ref 26.6–33.0)
MCHC: 33 g/dL (ref 31.5–35.7)
MCV: 89 fL (ref 79–97)
Monocytes Absolute: 0.4 10*3/uL (ref 0.1–0.9)
Monocytes: 8 %
Neutrophils Absolute: 3.8 10*3/uL (ref 1.4–7.0)
Neutrophils: 73 %
Platelets: 368 10*3/uL (ref 150–450)
RBC: 3.84 x10E6/uL (ref 3.77–5.28)
RDW: 14 % (ref 11.7–15.4)
WBC: 5.2 10*3/uL (ref 3.4–10.8)

## 2022-01-16 LAB — IGG, IGA, IGM
IgA/Immunoglobulin A, Serum: 35 mg/dL — ABNORMAL LOW (ref 87–352)
IgG (Immunoglobin G), Serum: 486 mg/dL — ABNORMAL LOW (ref 586–1602)
IgM (Immunoglobulin M), Srm: 7 mg/dL — ABNORMAL LOW (ref 26–217)

## 2022-01-16 LAB — VITAMIN D 25 HYDROXY (VIT D DEFICIENCY, FRACTURES): Vit D, 25-Hydroxy: 50.5 ng/mL (ref 30.0–100.0)

## 2022-03-02 ENCOUNTER — Ambulatory Visit
Admission: RE | Admit: 2022-03-02 | Discharge: 2022-03-02 | Disposition: A | Discharge status: Home / Self Care | Payer: Medicare Other | Source: Ambulatory Visit | Attending: Internal Medicine | Admitting: Internal Medicine

## 2022-03-02 DIAGNOSIS — Z1231 Encounter for screening mammogram for malignant neoplasm of breast: Secondary | ICD-10-CM

## 2022-07-24 ENCOUNTER — Encounter: Payer: Self-pay | Admitting: Neurology

## 2022-07-24 ENCOUNTER — Ambulatory Visit: Payer: Medicare Other | Admitting: Neurology

## 2022-07-24 VITALS — BP 92/66 | HR 85 | Ht 63.0 in | Wt 201.8 lb

## 2022-07-24 DIAGNOSIS — Z79899 Other long term (current) drug therapy: Secondary | ICD-10-CM

## 2022-07-24 DIAGNOSIS — G35 Multiple sclerosis: Secondary | ICD-10-CM

## 2022-07-24 NOTE — Progress Notes (Signed)
[facility]  PATIENT: Ann Oconnor DOB: 06/10/1975  REFERRING DOCTOR OR PCP:  Tomi Bamberger SOURCE: Patient, records, images  _________________________________   HISTORICAL  CHIEF COMPLAINT:  Chief Complaint  Patient presents with   Follow-up    Rm 15, w mother. Here to f/u for MS flare up. Having extreme fatigue and loss of balance. Pt had a UTI last week and treated. Currently needing to use a cane to ambulate. Having numbness in R hand, intermittently. Having SOB as well.     HISTORY OF PRESENT ILLNESS:  Ann Oconnor is a 47 y.o. woman with aggressive relapsing remitting MS who was diagnosed in 2002.   She had a very severe series of exacerbations in 2016 after stopping Gilenya.     Update 07/24/2022: She is on Ocrevus --- her last infusion was late 2023 and next is August 30, 2022.  She is ordered for 300 mg IV q 8 months.  .She has low IgM and IgA and IgG.    Last IgG 01/15/2022 was 486 and last IgM was 7, last IgA 35 --- 01/15/2022.  Gait is doing well and no fall since last visit.She can walk a mile without a stop.   She has a mild left  foot drop and some spasticity    She has no numbness or tingling.       Bladder function is good.      Arm strength is good.  She notes no difficulties with her vision.    She has some fatigue but feels this is stable.  She sleeps well most nights.  Mood and cognition is doing well.     She had additional breast imaging which was fine.       MS:    She presented in 2002 with numbness in her legs and poor gait.  The MRI scan showed MS plaques in the spinal cord and the brain and she was diagnosed with multiple sclerosis. She was initially placed on Rebif. At first she did well with only a couple of small central exacerbations. However, 2003 she had a more severe exacerbation that affected her gait. She was placed on a combination Rebif and Copaxone for a short period of time and then was on Copaxone monotherapy. She  switched to Gilenya in 2011 due to needle fatigue. She had numbness in her feet that would not go away and was switched to Tecfidera from Gilenya in November 2015.  In January 2016, she lost strength in both legs and urinary incontinence. She had a course of IV steroids without any benefit in late January. Because there was no benefit, she was admitted to [facility] for a course of plasmapheresis starting February 5. She got no benefit from the plasmapheresis. During the next week, she was home but she was doing worse and worse. She then presented to [facility]. She was admitted for another week of steroids. An MRI done at admission showed very aggressive MS changes.    Brain 04/28/2014 shows multiple infratentorial plaques including left greater than right middle cerebellar peduncles, bilateral pons, and cerebellum. Additionally there are multiple foci in the hemispheres, some with a concentric demyelinating pattern that could be consistent with Balo's concentric sclerosis, an especially fulminant form of multiple sclerosis. She was unable to complete the examination or get contrast due to the need to terminate the study early.   When compared to an MRI dated 02/07/2012, there has been dramatic change with multiple new brain stem and  hemispheric foci.  MRI of the cervical spine 10/26/2010. showed multiple plaques:  right at the C2 level, left aspect C2-3 level, right aspect C3 level, the posterior central aspect C3 level, upper C6 level slightly greater to the right and C7 level greater to the left.  Abnormal signal within the left aspect of the cord at the T3- T4 level.   MRI of the brain in 05/19/2014 showed several additional posterior fossa lesions prompting initiation of Novantrone.    After 5 cycles of Novantrone , she started Ocrevus in 2017. .     IMAGING  MRI 12/13/2018 showed Multiple T2/flair hyperintense foci in the cerebellum, brainstem, thalamus and hemispheres in a pattern and configuration  consistent with chronic demyelinating plaque associated with multiple sclerosis.  None of the foci appears to be acute and they do not enhance.  Compared to the MRI dated 11/06/2017, there are no new lesions.  MRi brain 11/05/2021 Multiple T2/FLAIR hyperintense foci in the cerebral hemispheres, brainstem and cerebellum in a pattern consistent with chronic demyelinating plaque associated with multiple sclerosis. None of the foci appear to be acute. They do not enhance compared to the MRI from 12/13/2018, there are no new lesions.   MRi cervical spine 11/05/2021 showed T2 hyperintense foci are noted posterocentrally slightly to the left adjacent to C2-C3, posterolaterally to the right adjacent to C3-C4, posteriorly, slightly to the left adjacent to C5, to the left adjacent to C6, posterolaterally to the right adjacent to T2.     REVIEW OF SYSTEMS: Constitutional: No fevers, chills, sweats, or change in appetite.  Fatigue Eyes: No visual changes, double vision, eye pain Ear, nose and throat: No hearing loss, ear pain, nasal congestion, sore throat Cardiovascular: No chest pain, palpitations Respiratory:  No shortness of breath at rest or with exertion.   No wheezes GastrointestinaI: No nausea, vomiting.  Some fecal incontinence Genitourinary:  as above. Musculoskeletal:  No neck pain, back pain Integumentary: No rash, pruritus, skin lesions Neurological: as above Psychiatric: Notes depression at this time.  Some anxiety Endocrine: No palpitations, diaphoresis, change in appetite, change in weigh or increased thirst Hematologic/Lymphatic:  No anemia, purpura, petechiae. Allergic/Immunologic: No itchy/runny eyes, nasal congestion, recent allergic reactions, rashes  ALLERGIES: No Known Allergies  HOME MEDICATIONS:  Current Outpatient Medications:    desvenlafaxine (PRISTIQ) 100 MG 24 hr tablet, TAKE 1 TABLET(100 MG) BY MOUTH DAILY, Disp: 90 tablet, Rfl: 3   gabapentin (NEURONTIN) 400 MG capsule,  TAKE 1 CAPSULE(400 MG) BY MOUTH TWICE DAILY, Disp: 180 capsule, Rfl: 3   ocrelizumab (OCREVUS) 300 MG/10ML injection, See admin instructions., Disp: , Rfl:   PAST MEDICAL HISTORY: Past Medical History:  Diagnosis Date   Anxiety    Gait instability    Head injury, closed, with brief LOC (HCC) 2003   fall   Hyperlipidemia    Incontinence    LGSIL (low grade squamous intraepithelial dysplasia) 03/2015   positive high risk HPV subtype 18/45.  Colpo normal with neg ECC   Movement disorder    MS (multiple sclerosis) (HCC) 2002   Neuromuscular disorder (HCC)    MS   Seizures (HCC) 2003   none since 2003   UTI (lower urinary tract infection)    Vision abnormalities     PAST SURGICAL HISTORY: Past Surgical History:  Procedure Laterality Date   CERVICAL CONE BIOPSY  2008   CIN 2   COLONOSCOPY     2010   PORT-A-CATH REMOVAL N/A 02/16/2016   Procedure: REMOVAL PORT-A-CATH;  Surgeon:  Manus Rudd, MD;  Location: [facility] OR;  Service: General;  Laterality: N/A;   PORTACATH PLACEMENT Left 08/18/2014   PORTACATH PLACEMENT Left 08/18/2014   Procedure: LEFT SUBCLAVIAN VEIN PORT PLACEMENT;  Surgeon: Manus Rudd, MD;  Location: [facility] OR;  Service: General;  Laterality: Left;    FAMILY HISTORY: Family History  Problem Relation Age of Onset   Thyroid disease Mother    Heart disease Father    Mitral valve prolapse Father    Cancer Father        lung/brain- tumors   Thyroid disease Sister    Thyroid disease Sister    Colon cancer Paternal Aunt    Cancer Paternal Aunt        not sure what kind, maybe cervix   Breast cancer Neg Hx    Colon polyps Neg Hx    Esophageal cancer Neg Hx    Rectal cancer Neg Hx    Stomach cancer Neg Hx     SOCIAL HISTORY:  Social History   Socioeconomic History   Marital status: Single    Spouse name: Not on file   Number of children: Not on file   Years of education: Not on file   Highest education level: Not on file  Occupational History   Not on  file  Tobacco Use   Smoking status: Never   Smokeless tobacco: Never  Vaping Use   Vaping Use: Never used  Substance and Sexual Activity   Alcohol use: No   Drug use: No   Sexual activity: Not Currently    Comment: 1st intercourse- 23, partners- 3,   Other Topics Concern   Not on file  Social History Narrative   Not on file   Social Determinants of Health   Financial Resource Strain: Not on file  Food Insecurity: Not on file  Transportation Needs: Not on file  Physical Activity: Not on file  Stress: Not on file  Social Connections: Not on file  Intimate Partner Violence: Not on file     PHYSICAL EXAM  Vitals:   10/16/21 1440  BP: 95/61  Pulse: 88  Weight: 200 lb 9.6 oz (91 kg)  Height: 5\' 3"  (1.6 m)    Body mass index is 35.53 kg/m.   General: The patient is well-developed and well-nourished and in no acute distress   Neurologic Exam  Mental status: The patient is alert and oriented x 3 at the time of the examination. The patient has apparent normal recent and remote memory,  attention span and concentration ability now seem normal.   Speech is normal.     Cranial nerves: Extraocular movements are full. Facial strength and sensation is normal. No dysarthria is noted.  No obvious hearing deficits are noted.  Motor:  Muscle bulk is normal.   Tone is mildly increased in legs, left greater than right.. Strength is  5/5 in arms and legs now except 4+/5 left EHL and 4++ ankle extension.   Sensory: Sensory testing is intact to pinprick, soft touch and vibration sensation in arms and legs now.   Coordination: Cerebellar testing reveals good finger-nose-finger.   Heels to shin is reduced on the left   Gait and station: She has a normal station.  The gait is mildly wide and she has a mild left foot drop.  She does not need a cane.   Tandem gait is wide.  Romberg is negative.  Reflexes: Deep tendon reflexes are increased bilaterally in legs.  DIAGNOSTIC DATA  (LABS, IMAGING, TESTING) - I reviewed patient records, labs, notes, testing and imaging myself where available.  Lab Results  Component Value Date   WBC 4.8 07/05/2021   HGB 12.3 07/05/2021   HCT 35.7 07/05/2021   MCV 93 07/05/2021   PLT 338 07/05/2021      Component Value Date/Time   NA 145 (H) 02/02/2020 1101   K 5.1 02/02/2020 1101   CL 104 02/02/2020 1101   CO2 26 02/02/2020 1101   GLUCOSE 78 02/02/2020 1101   GLUCOSE 84 09/23/2019 1118   BUN 10 02/02/2020 1101   CREATININE 0.77 02/02/2020 1101   CREATININE 0.82 09/23/2019 1118   CALCIUM 9.2 02/02/2020 1101   PROT 6.5 02/02/2020 1101   ALBUMIN 4.3 02/02/2020 1101   AST 14 02/02/2020 1101   ALT 9 02/02/2020 1101   ALKPHOS 84 02/02/2020 1101   BILITOT <0.2 02/02/2020 1101   GFRNONAA 94 02/02/2020 1101   GFRAA 108 02/02/2020 1101   Lab Results  Component Value Date   CHOL 143 10/30/2017   HDL 62 10/30/2017   LDLCALC 68 10/30/2017   TRIG 64 10/30/2017   CHOLHDL 2.3 10/30/2017   No results found for: "HGBA1C" Lab Results  Component Value Date   VITAMINB12 348 04/04/2015   Lab Results  Component Value Date   TSH 2.871 08/21/2011       ASSESSMENT AND PLAN  Multiple sclerosis (HCC) - Plan: MR BRAIN W WO CONTRAST, MR CERVICAL SPINE W WO CONTRAST  Depression with anxiety  High risk medication use  Numbness - Plan: MR BRAIN W WO CONTRAST, MR CERVICAL SPINE W WO CONTRAST  Other fatigue   1.  She will continue ocrelizumab 300 mg q 8 months (reduce due to low IgM, IgG and IgA    If we need to swithc consider Tysabri (was JCV negative in 2021) or Mavenclad 2.   Continue vitamin D supplements 3.   Stay active and try to lose weight if possible.   4.   Continue Pristiq and gabapentin.   5.   Return in 6 months but call sooner if there are new or worsening neurologic symptoms.    Antwan Pandya A. Epimenio Foot, MD, PhD, FAAN Certified in Neurology, Clinical Neurophysiology, Sleep Medicine, Pain Medicine and  Neuroimaging Director, [facility]  [facility] [address] [place], [place] [address] [phone]

## 2022-07-26 LAB — IGG, IGA, IGM
IgA/Immunoglobulin A, Serum: 34 mg/dL — ABNORMAL LOW (ref 87–352)
IgG (Immunoglobin G), Serum: 532 mg/dL — ABNORMAL LOW (ref 586–1602)
IgM (Immunoglobulin M), Srm: 9 mg/dL — ABNORMAL LOW (ref 26–217)

## 2022-07-26 LAB — CD20 B CELLS
% CD19-B Cells: 0.1 % — ABNORMAL LOW (ref 4.6–22.1)
% CD20-B Cells: 0 % — ABNORMAL LOW (ref 5.0–22.3)

## 2022-08-09 ENCOUNTER — Other Ambulatory Visit: Payer: Self-pay | Admitting: *Deleted

## 2022-08-09 DIAGNOSIS — G35 Multiple sclerosis: Secondary | ICD-10-CM

## 2022-08-09 MED ORDER — OCREVUS 300 MG/10ML IV SOLN: INTRAVENOUS | 0 refills | Status: AC

## 2022-09-19 ENCOUNTER — Telehealth: Payer: Self-pay | Admitting: *Deleted

## 2022-09-19 NOTE — Telephone Encounter (Addendum)
Received fax for [employer] pt foundation.  Made multiple attempts to contact pt about pts ongoing eligibility with the [facility] foundation an-d have been unsuccessful.  Call [phone]. Message sent to pt. Mychart.   Her next dose is due 11-01-2022 Ocrevus.

## 2022-12-04 ENCOUNTER — Encounter: Payer: Self-pay | Admitting: Nurse Practitioner

## 2022-12-05 ENCOUNTER — Encounter: Payer: Medicare Other | Admitting: Nurse Practitioner

## 2022-12-19 ENCOUNTER — Other Ambulatory Visit (HOSPITAL_COMMUNITY)
Admission: RE | Admit: 2022-12-19 | Discharge: 2022-12-19 | Disposition: A | Discharge status: Home / Self Care | Payer: Medicare Other | Source: Ambulatory Visit | Attending: Nurse Practitioner | Admitting: Nurse Practitioner

## 2022-12-19 ENCOUNTER — Ambulatory Visit (INDEPENDENT_AMBULATORY_CARE_PROVIDER_SITE_OTHER): Payer: Medicare Other | Admitting: Nurse Practitioner

## 2022-12-19 ENCOUNTER — Encounter: Payer: Self-pay | Admitting: Nurse Practitioner

## 2022-12-19 VITALS — BP 122/84 | HR 73 | Ht 63.5 in | Wt 219.0 lb

## 2022-12-19 DIAGNOSIS — Z8262 Family history of osteoporosis: Secondary | ICD-10-CM

## 2022-12-19 DIAGNOSIS — Z1151 Encounter for screening for human papillomavirus (HPV): Secondary | ICD-10-CM | POA: Diagnosis not present

## 2022-12-19 DIAGNOSIS — Z9189 Other specified personal risk factors, not elsewhere classified: Secondary | ICD-10-CM | POA: Diagnosis not present

## 2022-12-19 DIAGNOSIS — N870 Mild cervical dysplasia: Secondary | ICD-10-CM

## 2022-12-19 DIAGNOSIS — Z124 Encounter for screening for malignant neoplasm of cervix: Secondary | ICD-10-CM | POA: Diagnosis not present

## 2022-12-19 DIAGNOSIS — Z01419 Encounter for gynecological examination (general) (routine) without abnormal findings: Secondary | ICD-10-CM

## 2022-12-19 DIAGNOSIS — Z780 Asymptomatic menopausal state: Secondary | ICD-10-CM

## 2022-12-19 NOTE — Progress Notes (Signed)
Ann Oconnor [birth year] [id number]   History:  47 y.o. G0 presents for breast and pelvic exam without GYN complaints. See pap history below. 2008 LEEP HGSIL, H/O persistent LGSIL, declines colposcopy and wants to monitor with paps at this time. History of vitamin D deficiency, HLD, MS (diagnosed in 2016). Early menopause at age 27 due to MS medication, elevated FSH at that time. Denies menopausal symptoms. Not sexually active.   2008 LEEP for HGSIL 2017 pap LGSIL + HPV, negative biopsy 2019 LGSIL neg HR HPV, CIN-1 2020/2021 LGSIL neg HR HPV 03/2020 LGSIL neg HR HPV - patient opted out of colpo 11/2020 ASCUS neg HR HPV 11/2021 Normal neg HR HPV  Gynecologic History Patient's last menstrual period was 07/19/2014.   Contraception: post menopausal status Sexually active: No  Health maintenance Last Pap: 11/21/2021. Results were: Normal neg HPV Last mammogram: 02/20/2022. Results were: Normal Last colonoscopy: 09/06/2020. Results were: Benign polyp, 10-year recall Last Dexa: Not indicated  Past medical history, past surgical history, family history and social history were all reviewed and documented in the EPIC chart. 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   ROS:  A ROS was performed and pertinent positives and negatives are included.  Exam:  Vitals:   12/19/22 1517  BP: 122/84  Pulse: 73  SpO2: 97%  Weight: 219 lb (99.3 kg)  Height: 5' 3.5" (1.613 m)     Body mass index is 38.19 kg/m.  General appearance:  Normal Thyroid:  Symmetrical, normal in size, without palpable masses or nodularity. Respiratory  Auscultation:  Clear without wheezing or rhonchi Cardiovascular  Auscultation:  Regular rate, without rubs, murmurs or gallops  Edema/varicosities:  Not grossly evident Abdominal  Soft,nontender, without masses, guarding or rebound.  Liver/spleen:  No organomegaly noted  Hernia:  None appreciated  Skin  Inspection:  Grossly normal   Breasts:  Examined lying and sitting.   Right: Without masses, retractions, discharge or axillary adenopathy.   Left: Without masses, retractions, discharge or axillary adenopathy. Pelvic: External genitalia:  no lesions              Urethra:  normal appearing urethra with no masses, tenderness or lesions              Bartholins and Skenes: normal                 Vagina: normal appearing vagina with normal color and discharge, no lesions              Cervix: no lesions Bimanual Exam:  Uterus:  difficult to palpate due to body habitus but no masses or tenderness              Adnexa: no mass, fullness, tenderness              Rectovaginal: Deferred              Anus:  normal, no lesions  Patient informed chaperone available to be present for breast and pelvic exam. Patient has requested no chaperone to be present. Patient has been advised what will be completed during breast and pelvic exam.    Assessment/Plan:  47 y.o. G for breast and pelvic exam.  Encounter for breast and pelvic examination - Education provided on SBEs, importance of preventative screenings, current guidelines, high calcium diet, regular exercise, and multivitamin daily. Labs with PCP.   Family history of osteoporosis in mother - DXA recommended due to mother's history, personal use of steroids and  early menopause. Planning to schedule with PCP.   Postmenopausal - medication-induced at age 47. No HRT, denies symptoms.   Cervical cancer screening - Plan: Cytology - PAP( ). 2008 LEEP HGSIL. H/O persistent LGSIL 2017-03/2019, 11/2020 ASCUS neg HR HPV, 11/2021 normal neg HPV.   Screening for breast cancer - Normal mammogram history.  Continue annual screenings.  Normal breast exam today  Screening for colon cancer - 09/2020 colonoscopy. Will repeat at GI's recommended interval.   Return in about 1 year (around 12/19/2023) for B&P, high risk.       Olivia Mackie [facility], 3:42 PM 12/19/2022

## 2022-12-20 ENCOUNTER — Telehealth: Payer: Self-pay

## 2022-12-20 NOTE — Telephone Encounter (Signed)
Faxed paperwork to [employer] [phone]

## 2022-12-21 LAB — CYTOLOGY - PAP
Comment: NEGATIVE
Diagnosis: UNDETERMINED — AB
High risk HPV: NEGATIVE

## 2022-12-24 ENCOUNTER — Encounter: Payer: Medicare Other | Admitting: Nurse Practitioner

## 2023-01-21 ENCOUNTER — Other Ambulatory Visit: Payer: Self-pay | Admitting: Internal Medicine

## 2023-01-21 DIAGNOSIS — Z1231 Encounter for screening mammogram for malignant neoplasm of breast: Secondary | ICD-10-CM

## 2023-01-29 ENCOUNTER — Other Ambulatory Visit: Payer: Self-pay | Admitting: *Deleted

## 2023-01-29 NOTE — Telephone Encounter (Signed)
Last seen on 07/24/22 Follow up scheduled on 01/30/23   Rx pending until patient has visit on 01/30/23

## 2023-01-30 ENCOUNTER — Encounter: Payer: Self-pay | Admitting: Neurology

## 2023-01-30 ENCOUNTER — Ambulatory Visit: Payer: Medicare Other | Admitting: Neurology

## 2023-01-30 VITALS — BP 115/79 | HR 86 | Ht 63.0 in | Wt 220.0 lb

## 2023-01-30 DIAGNOSIS — G35 Multiple sclerosis: Secondary | ICD-10-CM

## 2023-01-30 DIAGNOSIS — R5383 Other fatigue: Secondary | ICD-10-CM

## 2023-01-30 DIAGNOSIS — Z79899 Other long term (current) drug therapy: Secondary | ICD-10-CM

## 2023-01-30 DIAGNOSIS — R260 Ataxic gait: Secondary | ICD-10-CM

## 2023-01-30 NOTE — Progress Notes (Signed)
[facility]  PATIENT: Ann Oconnor DOB: [birth year]  REFERRING DOCTOR OR PCP:  Tomi Bamberger SOURCE: Patient, records, images  _________________________________   HISTORICAL  CHIEF COMPLAINT:  Chief Complaint  Patient presents with   Room 10    Pt is here Alone. Pt states that things have been going fine with her MS since her last appointment.     HISTORY OF PRESENT ILLNESS:  Ann Oconnor is a 47 y.o. woman with aggressive relapsing remitting MS who was diagnosed in 2002.   She had a very severe series of exacerbations in 2016 after stopping Gilenya.     Update 01/30/2023: She is on Ocrevus --- her last infusion was August 30, 2022.  She is ordered for 300 mg IV q 8 months.  .She has low IgM and IgA and IgG.      Labs show some improvement: 01/15/2022: IgG was 486, IgM was 7, IgA 35 ---  07/24/2022: IgG was 532, IgM was 9 IgA was 34  CD19 was 0.1/CD20 was 0.0% on 07/24/2022.  This was done about 6 and half months after her previous Ocrevus.  Gait is doing well and no fall since last visit.She can walk a mile without a stop.  She uses the bannister on stairs.   She has a mild left  foot drop and some spasticity  No recent fall.     She has no numbness or tingling.     Bladder function is good.   Arm strength is good.  She notes no difficulties with her vision.    She has some fatigue but feels this is stable.  She sleeps well most nights.  Mood and cognition is doing well.        MS:    She presented in 2002 with numbness in her legs and poor gait.  The MRI scan showed MS plaques in the spinal cord and the brain and she was diagnosed with multiple sclerosis. She was initially placed on Rebif. At first she did well with only a couple of small central exacerbations. However, 2003 she had a more severe exacerbation that affected her gait. She was placed on a combination Rebif and Copaxone for a short period of time and then was on Copaxone monotherapy. She  switched to Gilenya in 2011 due to needle fatigue. She had numbness in her feet that would not go away and was switched to Tecfidera from Gilenya in November 2015.  In January 2016, she lost strength in both legs and urinary incontinence. She had a course of IV steroids without any benefit in late January. Because there was no benefit, she was admitted to [facility] for a course of plasmapheresis starting February 5. She got no benefit from the plasmapheresis. During the next week, she was home but she was doing worse and worse. She then presented to [facility]. She was admitted for another week of steroids. An MRI done at admission showed very aggressive MS changes.    Brain 04/28/2014 shows multiple infratentorial plaques including left greater than right middle cerebellar peduncles, bilateral pons, and cerebellum. Additionally there are multiple foci in the hemispheres, some with a concentric demyelinating pattern that could be consistent with Balo's concentric sclerosis, an especially fulminant form of multiple sclerosis. She was unable to complete the examination or get contrast due to the need to terminate the study early.   When compared to an MRI dated 02/07/2012, there has been dramatic change with multiple new brain stem and  hemispheric foci.  MRI of the cervical spine 10/26/2010. showed multiple plaques:  right at the C2 level, left aspect C2-3 level, right aspect C3 level, the posterior central aspect C3 level, upper C6 level slightly greater to the right and C7 level greater to the left.  Abnormal signal within the left aspect of the cord at the T3- T4 level.   MRI of the brain in 05/19/2014 showed several additional posterior fossa lesions prompting initiation of Novantrone.    After 5 cycles of Novantrone , she started Ocrevus in 2017. .     IMAGING  MRI 12/13/2018 showed Multiple T2/flair hyperintense foci in the cerebellum, brainstem, thalamus and hemispheres in a pattern and configuration  consistent with chronic demyelinating plaque associated with multiple sclerosis.  None of the foci appears to be acute and they do not enhance.  Compared to the MRI dated 11/06/2017, there are no new lesions.  MRi brain 11/05/2021 Multiple T2/FLAIR hyperintense foci in the cerebral hemispheres, brainstem and cerebellum in a pattern consistent with chronic demyelinating plaque associated with multiple sclerosis. None of the foci appear to be acute. They do not enhance compared to the MRI from 12/13/2018, there are no new lesions.   MRi cervical spine 11/05/2021 showed T2 hyperintense foci are noted posterocentrally slightly to the left adjacent to C2-C3, posterolaterally to the right adjacent to C3-C4, posteriorly, slightly to the left adjacent to C5, to the left adjacent to C6, posterolaterally to the right adjacent to T2.     REVIEW OF SYSTEMS: Constitutional: No fevers, chills, sweats, or change in appetite.  Fatigue Eyes: No visual changes, double vision, eye pain Ear, nose and throat: No hearing loss, ear pain, nasal congestion, sore throat Cardiovascular: No chest pain, palpitations Respiratory:  No shortness of breath at rest or with exertion.   No wheezes GastrointestinaI: No nausea, vomiting.  Some fecal incontinence Genitourinary:  as above. Musculoskeletal:  No neck pain, back pain Integumentary: No rash, pruritus, skin lesions Neurological: as above Psychiatric: Notes depression at this time.  Some anxiety Endocrine: No palpitations, diaphoresis, change in appetite, change in weigh or increased thirst Hematologic/Lymphatic:  No anemia, purpura, petechiae. Allergic/Immunologic: No itchy/runny eyes, nasal congestion, recent allergic reactions, rashes  ALLERGIES: No Known Allergies  HOME MEDICATIONS:  Current Outpatient Medications:    ascorbic acid (VITAMIN C) 250 MG CHEW, 1 tablet Orally Once a day, Disp: , Rfl:    desvenlafaxine (PRISTIQ) 100 MG 24 hr tablet, TAKE 1 TABLET(100 MG)  BY MOUTH DAILY, Disp: 90 tablet, Rfl: 3   gabapentin (NEURONTIN) 400 MG capsule, TAKE 1 CAPSULE(400 MG) BY MOUTH TWICE DAILY, Disp: 180 capsule, Rfl: 3   ocrelizumab (OCREVUS) 300 MG/10ML injection, 300mg  IV every 8 months, Disp: 10 mL, Rfl: 0   Cholecalciferol (VITAMIN D3 PO), Take 2,000 Units by mouth daily., Disp: , Rfl:   PAST MEDICAL HISTORY: Past Medical History:  Diagnosis Date   Anxiety    Gait instability    Head injury, closed, with brief LOC (HCC) 2003   fall   Hyperlipidemia    Incontinence    LGSIL (low grade squamous intraepithelial dysplasia) 03/2015   positive high risk HPV subtype 18/45.  Colpo normal with neg ECC   Movement disorder    MS (multiple sclerosis) (HCC) 2002   Neuromuscular disorder (HCC)    MS   Seizures (HCC) 2003   none since 2003   UTI (lower urinary tract infection)    Vision abnormalities     PAST SURGICAL HISTORY: Past  Surgical History:  Procedure Laterality Date   CERVICAL CONE BIOPSY  2008   CIN 2   COLONOSCOPY     2010   PORT-A-CATH REMOVAL N/A 02/16/2016   Procedure: REMOVAL PORT-A-CATH;  Surgeon: Manus Rudd, MD;  Location: [facility] OR;  Service: General;  Laterality: N/A;   PORTACATH PLACEMENT Left 08/18/2014   PORTACATH PLACEMENT Left 08/18/2014   Procedure: LEFT SUBCLAVIAN VEIN PORT PLACEMENT;  Surgeon: Manus Rudd, MD;  Location: [facility] OR;  Service: General;  Laterality: Left;    FAMILY HISTORY: Family History  Problem Relation Age of Onset   Thyroid disease Mother    Heart disease Father    Mitral valve prolapse Father    Cancer Father        lung/brain- tumors   Thyroid disease Sister    Thyroid disease Sister    Colon cancer Paternal Aunt    Cancer Paternal Aunt        not sure what kind, maybe cervix   Breast cancer Neg Hx    Colon polyps Neg Hx    Esophageal cancer Neg Hx    Rectal cancer Neg Hx    Stomach cancer Neg Hx     SOCIAL HISTORY:  Social History   Socioeconomic History   Marital status: Single     Spouse name: Not on file   Number of children: Not on file   Years of education: Not on file   Highest education level: Not on file  Occupational History   Not on file  Tobacco Use   Smoking status: Never   Smokeless tobacco: Never  Vaping Use   Vaping status: Never Used  Substance and Sexual Activity   Alcohol use: No   Drug use: No   Sexual activity: Not Currently    Birth control/protection: Post-menopausal    Comment: 1st intercourse- 23, partners- 3  Other Topics Concern   Not on file  Social History Narrative   Not on file   Social Determinants of Health   Financial Resource Strain: Not on file  Food Insecurity: Not on file  Transportation Needs: Not on file  Physical Activity: Not on file  Stress: Not on file  Social Connections: Unknown (07/14/2021)   [facility] from [facility]   Social Network    Social Network: Not on file  Intimate Partner Violence: Unknown (06/05/2021)   Received from [facility], [facility]   HITS    Physically Hurt: Not on file    Insult or Talk Down To: Not on file    Threaten Physical Harm: Not on file    Scream or Curse: Not on file     PHYSICAL EXAM  Vitals:   01/30/23 1138  BP: 115/79  Pulse: 86  Weight: 220 lb (99.8 kg)  Height: 5\' 3"  (1.6 m)    Body mass index is 38.97 kg/m.   General: The patient is well-developed and well-nourished and in no acute distress   Neurologic Exam  Mental status: The patient is alert and oriented x 3 at the time of the examination. The patient has apparent normal recent and remote memory,  attention span and concentration ability now seem normal.   Speech is normal.     Cranial nerves: Extraocular movements are full. Facial strength and sensation is normal. No dysarthria is noted.  No obvious hearing deficits are noted.  Motor:  Muscle bulk is normal.   Tone is mildly increased in legs, left greater than right.. Strength is  5/5  in arms and legs now except 4+/5 left  EHL and 4++ ankle extension.   Sensory: Sensory testing is intact to pinprick, soft touch and vibration sensation in arms and legs now.   Coordination: She has good finger-nose-finger.   Heels to shin is reduced on the left   Gait and station: She has a normal station.  The gait is mildly wide and she has a mild left foot drop.  She does not need a cane.   Tandem gait is wide.  Romberg is negative.  Reflexes: Deep tendon reflexes are increased bilaterally in legs.        DIAGNOSTIC DATA (LABS, IMAGING, TESTING) - I reviewed patient records, labs, notes, testing and imaging myself where available.  Lab Results  Component Value Date   WBC 5.2 01/15/2022   HGB 11.3 01/15/2022   HCT 34.2 01/15/2022   MCV 89 01/15/2022   PLT 368 01/15/2022      Component Value Date/Time   NA 145 (H) 02/02/2020 1101   K 5.1 02/02/2020 1101   CL 104 02/02/2020 1101   CO2 26 02/02/2020 1101   GLUCOSE 78 02/02/2020 1101   GLUCOSE 84 09/23/2019 1118   BUN 10 02/02/2020 1101   CREATININE 0.77 02/02/2020 1101   CREATININE 0.82 09/23/2019 1118   CALCIUM 9.2 02/02/2020 1101   PROT 6.5 02/02/2020 1101   ALBUMIN 4.3 02/02/2020 1101   AST 14 02/02/2020 1101   ALT 9 02/02/2020 1101   ALKPHOS 84 02/02/2020 1101   BILITOT <0.2 02/02/2020 1101   GFRNONAA 94 02/02/2020 1101   GFRAA 108 02/02/2020 1101   Lab Results  Component Value Date   CHOL 143 10/30/2017   HDL 62 10/30/2017   LDLCALC 68 10/30/2017   TRIG 64 10/30/2017   CHOLHDL 2.3 10/30/2017   No results found for: "HGBA1C" Lab Results  Component Value Date   VITAMINB12 348 04/04/2015   Lab Results  Component Value Date   TSH 2.871 08/21/2011       ASSESSMENT AND PLAN  Relapsing remitting multiple sclerosis (HCC) - Plan: IgG, IgA, IgM, CBC with Differential/Platelet  High risk medication use - Plan: IgG, IgA, IgM, CBC with Differential/Platelet  Other fatigue  Ataxic gait   1.  She is on a lower dose of ocrelizumab (300 mg  every 8 months) due to low IgM, IgG and IgA.  We will check labs again today.   If we need to swithc consider Tysabri (was JCV negative in 2021) or Mavenclad 2.   Continue vitamin D supplements 3.   Stay active and try to lose weight if possible.   4.   Continue Pristiq and gabapentin.   5.   Return in 6 months but call sooner if there are new or worsening neurologic symptoms.   This visit is part of a comprehensive longitudinal care medical relationship regarding the patients primary diagnosis of multiple sclerosis and related concerns.   Chou Busler A. Epimenio Foot, MD, PhD, FAAN Certified in Neurology, Clinical Neurophysiology, Sleep Medicine, Pain Medicine and Neuroimaging Director, [facility] at [facility]  [facility] [address], [address] [place], [place] [address] [phone]

## 2023-01-31 LAB — CBC WITH DIFFERENTIAL/PLATELET
Basophils Absolute: 0.1 10*3/uL (ref 0.0–0.2)
Basos: 1 %
EOS (ABSOLUTE): 0.1 10*3/uL (ref 0.0–0.4)
Eos: 2 %
Hematocrit: 37.2 % (ref 34.0–46.6)
Hemoglobin: 12.3 g/dL (ref 11.1–15.9)
Immature Grans (Abs): 0 10*3/uL (ref 0.0–0.1)
Immature Granulocytes: 0 %
Lymphocytes Absolute: 1.3 10*3/uL (ref 0.7–3.1)
Lymphs: 28 %
MCH: 30.8 pg (ref 26.6–33.0)
MCHC: 33.1 g/dL (ref 31.5–35.7)
MCV: 93 fL (ref 79–97)
Monocytes Absolute: 0.4 10*3/uL (ref 0.1–0.9)
Monocytes: 9 %
Neutrophils Absolute: 2.8 10*3/uL (ref 1.4–7.0)
Neutrophils: 60 %
Platelets: 350 10*3/uL (ref 150–450)
RBC: 3.99 x10E6/uL (ref 3.77–5.28)
RDW: 12.7 % (ref 11.7–15.4)
WBC: 4.7 10*3/uL (ref 3.4–10.8)

## 2023-01-31 LAB — IGG, IGA, IGM
IgA/Immunoglobulin A, Serum: 24 mg/dL — ABNORMAL LOW (ref 87–352)
IgG (Immunoglobin G), Serum: 569 mg/dL — ABNORMAL LOW (ref 586–1602)
IgM (Immunoglobulin M), Srm: 7 mg/dL — ABNORMAL LOW (ref 26–217)

## 2023-02-01 ENCOUNTER — Other Ambulatory Visit: Payer: Self-pay | Admitting: Neurology

## 2023-02-01 MED ORDER — DESVENLAFAXINE SUCCINATE ER 100 MG PO TB24: ORAL_TABLET | ORAL | 0 refills | Status: DC

## 2023-02-03 ENCOUNTER — Telehealth: Payer: Self-pay | Admitting: Neurology

## 2023-02-03 NOTE — Telephone Encounter (Signed)
Patient did not get a refill of her Pristiq at last appointment, I refilled it for her she was out.

## 2023-02-04 ENCOUNTER — Other Ambulatory Visit: Payer: Self-pay

## 2023-02-04 MED ORDER — GABAPENTIN 400 MG PO CAPS: ORAL_CAPSULE | ORAL | 3 refills | Status: DC

## 2023-03-05 ENCOUNTER — Ambulatory Visit
Admission: RE | Admit: 2023-03-05 | Discharge: 2023-03-05 | Disposition: A | Discharge status: Home / Self Care | Payer: Medicare Other | Source: Ambulatory Visit | Attending: Internal Medicine | Admitting: Internal Medicine

## 2023-03-05 DIAGNOSIS — Z1231 Encounter for screening mammogram for malignant neoplasm of breast: Secondary | ICD-10-CM

## 2023-04-29 ENCOUNTER — Other Ambulatory Visit: Payer: Self-pay | Admitting: Neurology

## 2023-07-27 ENCOUNTER — Other Ambulatory Visit: Payer: Self-pay | Admitting: Neurology

## 2023-08-01 NOTE — Telephone Encounter (Signed)
 Last seen on 03/01/23 " Continue Pristiq  and gabapentin " Follow up scheduled on 08/24/23

## 2023-08-15 ENCOUNTER — Encounter: Payer: Self-pay | Admitting: Neurology

## 2023-08-15 ENCOUNTER — Ambulatory Visit: Payer: Medicare Other | Admitting: Neurology

## 2023-08-15 VITALS — BP 107/74 | HR 85 | Ht 63.0 in | Wt 206.5 lb

## 2023-08-15 DIAGNOSIS — M21372 Foot drop, left foot: Secondary | ICD-10-CM

## 2023-08-15 DIAGNOSIS — R5383 Other fatigue: Secondary | ICD-10-CM

## 2023-08-15 DIAGNOSIS — Z79899 Other long term (current) drug therapy: Secondary | ICD-10-CM | POA: Diagnosis not present

## 2023-08-15 DIAGNOSIS — E559 Vitamin D deficiency, unspecified: Secondary | ICD-10-CM | POA: Diagnosis not present

## 2023-08-15 DIAGNOSIS — G35 Multiple sclerosis: Secondary | ICD-10-CM

## 2023-08-15 DIAGNOSIS — F418 Other specified anxiety disorders: Secondary | ICD-10-CM

## 2023-08-15 NOTE — Progress Notes (Signed)
 [facility]  PATIENT: Ann Oconnor DOB: [birth year]  REFERRING DOCTOR OR PCP:  Rolene Client SOURCE: Patient, records, images  _________________________________   HISTORICAL  CHIEF COMPLAINT:  Chief Complaint  Patient presents with   Follow-up    Pt in room 10. Alone. Here for MS follow up. DMT: Ocrevus . Next infusion date: 02/20/2024. Pt reports doing well, no falls, last eye exam: Jan 2025     HISTORY OF PRESENT ILLNESS:  Ann Oconnor is a 48 y.o. woman with aggressive relapsing remitting MS who was diagnosed in 2002.   She had a very severe series of exacerbations in 2016 after stopping Gilenya.     Update 01/30/2023: She is on Ocrevus  --- her last infusion was April 2025 and next one is December  She is ordered for 300 mg IV q 8 months.  .She has low IgM and IgA and IgG.     Last Igg was 569 and last IgM was 7.      Labs show some improvement: 01/15/2022: IgG was 486, IgM was 7, IgA 35 ---  07/24/2022: IgG was 532, IgM was 9 IgA was 34  CD19 was 0.1/CD20 was 0.0% on 07/24/2022.  This was done about 6 and half months after her previous Ocrevus .  Gait is doing well and no fall since last visit. One fall tripping over a curb.   She can walk a mile without a stop.  She uses the bannister on stairs.   She has a mild left  foot drop and some spasticity      She has no numbness or tingling.     Bladder function is good.   Arm strength is good.  She notes no difficulties with her vision.    She has some fatigue but feels this is stable.  She sleeps well most nights.  Mood and cognition is doing well.     She is on disability.       MS:    She presented in 2002 with numbness in her legs and poor gait.  The MRI scan showed MS plaques in the spinal cord and the brain and she was diagnosed with multiple sclerosis. She was initially placed on Rebif. At first she did well with only a couple of small central exacerbations. However, 2003 she had a more severe  exacerbation that affected her gait. She was placed on a combination Rebif and Copaxone for a short period of time and then was on Copaxone monotherapy. She switched to Gilenya in 2011 due to needle fatigue. She had numbness in her feet that would not go away and was switched to Tecfidera  from Gilenya in November 2015.  In January 2016, she lost strength in both legs and urinary incontinence. She had a course of IV steroids without any benefit in late January. Because there was no benefit, she was admitted to [facility] for a course of plasmapheresis starting February 5. She got no benefit from the plasmapheresis. During the next week, she was home but she was doing worse and worse. She then presented to [facility]. She was admitted for another week of steroids. An MRI done at admission showed very aggressive MS changes.    Brain 04/28/2014 shows multiple infratentorial plaques including left greater than right middle cerebellar peduncles, bilateral pons, and cerebellum. Additionally there are multiple foci in the hemispheres, some with a concentric demyelinating pattern that could be consistent with Balo's concentric sclerosis, an especially fulminant form of multiple sclerosis. She was  unable to complete the examination or get contrast due to the need to terminate the study early.   When compared to an MRI dated 02/07/2012, there has been dramatic change with multiple new brain stem and hemispheric foci.  MRI of the cervical spine 10/26/2010. showed multiple plaques:  right at the C2 level, left aspect C2-3 level, right aspect C3 level, the posterior central aspect C3 level, upper C6 level slightly greater to the right and C7 level greater to the left.  Abnormal signal within the left aspect of the cord at the T3- T4 level.   MRI of the brain in 05/19/2014 showed several additional posterior fossa lesions prompting initiation of Novantrone .    After 5 cycles of Novantrone  , she started Ocrevus  in 2017. .      IMAGING  MRI 12/13/2018 showed Multiple T2/flair hyperintense foci in the cerebellum, brainstem, thalamus and hemispheres in a pattern and configuration consistent with chronic demyelinating plaque associated with multiple sclerosis.  None of the foci appears to be acute and they do not enhance.  Compared to the MRI dated 11/06/2017, there are no new lesions.  MRi brain 11/05/2021 Multiple T2/FLAIR hyperintense foci in the cerebral hemispheres, brainstem and cerebellum in a pattern consistent with chronic demyelinating plaque associated with multiple sclerosis. None of the foci appear to be acute. They do not enhance compared to the MRI from 12/13/2018, there are no new lesions.   MRi cervical spine 11/05/2021 showed T2 hyperintense foci are noted posterocentrally slightly to the left adjacent to C2-C3, posterolaterally to the right adjacent to C3-C4, posteriorly, slightly to the left adjacent to C5, to the left adjacent to C6, posterolaterally to the right adjacent to T2.     REVIEW OF SYSTEMS: Constitutional: No fevers, chills, sweats, or change in appetite.  Fatigue Eyes: No visual changes, double vision, eye pain Ear, nose and throat: No hearing loss, ear pain, nasal congestion, sore throat Cardiovascular: No chest pain, palpitations Respiratory:  No shortness of breath at rest or with exertion.   No wheezes GastrointestinaI: No nausea, vomiting.  Some fecal incontinence Genitourinary:  as above. Musculoskeletal:  No neck pain, back pain Integumentary: No rash, pruritus, skin lesions Neurological: as above Psychiatric: Notes depression at this time.  Some anxiety Endocrine: No palpitations, diaphoresis, change in appetite, change in weigh or increased thirst Hematologic/Lymphatic:  No anemia, purpura, petechiae. Allergic/Immunologic: No itchy/runny eyes, nasal congestion, recent allergic reactions, rashes  ALLERGIES: No Known Allergies  HOME MEDICATIONS:  Current Outpatient  Medications:    desvenlafaxine  (PRISTIQ ) 100 MG 24 hr tablet, TAKE ONE TABLET(100 MG) BY MOUTH DAILY, Disp: 90 tablet, Rfl: 0   gabapentin  (NEURONTIN ) 400 MG capsule, TAKE 1 CAPSULE(400 MG) BY MOUTH TWICE DAILY, Disp: 180 capsule, Rfl: 3   levothyroxine (SYNTHROID) 50 MCG tablet, Take 50 mcg by mouth daily before breakfast., Disp: , Rfl:    LINZESS 145 MCG CAPS capsule, Take 145 mcg by mouth daily., Disp: , Rfl:    ocrelizumab  (OCREVUS ) 300 MG/10ML injection, 300mg  IV every 8 months, Disp: 10 mL, Rfl: 0   ascorbic acid (VITAMIN C) 250 MG CHEW, 1 tablet Orally Once a day (Patient not taking: Reported on 08/15/2023), Disp: , Rfl:   PAST MEDICAL HISTORY: Past Medical History:  Diagnosis Date   Anxiety    Gait instability    Head injury, closed, with brief LOC (HCC) 2003   fall   Hyperlipidemia    Incontinence    LGSIL (low grade squamous intraepithelial dysplasia) 03/2015  positive high risk HPV subtype 18/45.  Colpo normal with neg ECC   Movement disorder    MS (multiple sclerosis) (HCC) 2002   Neuromuscular disorder (HCC)    MS   Seizures (HCC) 2003   none since 2003   UTI (lower urinary tract infection)    Vision abnormalities     PAST SURGICAL HISTORY: Past Surgical History:  Procedure Laterality Date   CERVICAL CONE BIOPSY  2008   CIN 2   COLONOSCOPY     2010   PORT-A-CATH REMOVAL N/A 02/16/2016   Procedure: REMOVAL PORT-A-CATH;  Surgeon: Dareen Ebbing, MD;  Location: [facility] OR;  Service: General;  Laterality: N/A;   PORTACATH PLACEMENT Left 08/18/2014   PORTACATH PLACEMENT Left 08/18/2014   Procedure: LEFT SUBCLAVIAN VEIN PORT PLACEMENT;  Surgeon: Dareen Ebbing, MD;  Location: [facility] OR;  Service: General;  Laterality: Left;    FAMILY HISTORY: Family History  Problem Relation Age of Onset   Thyroid disease Mother    Heart disease Father    Mitral valve prolapse Father    Cancer Father        lung/brain- tumors   Thyroid disease Sister    Thyroid disease Sister    Colon  cancer Paternal Aunt    Cancer Paternal Aunt        not sure what kind, maybe cervix   Breast cancer Neg Hx    Colon polyps Neg Hx    Esophageal cancer Neg Hx    Rectal cancer Neg Hx    Stomach cancer Neg Hx     SOCIAL HISTORY:  Social History   Socioeconomic History   Marital status: Single    Spouse name: Not on file   Number of children: Not on file   Years of education: Not on file   Highest education level: Not on file  Occupational History   Not on file  Tobacco Use   Smoking status: Never   Smokeless tobacco: Never  Vaping Use   Vaping status: Never Used  Substance and Sexual Activity   Alcohol use: No   Drug use: No   Sexual activity: Not Currently    Birth control/protection: Post-menopausal    Comment: 1st intercourse- 23, partners- 3  Other Topics Concern   Not on file  Social History Narrative   Not on file   Social Drivers of Health   Financial Resource Strain: Not on file  Food Insecurity: Not on file  Transportation Needs: Not on file  Physical Activity: Not on file  Stress: Not on file  Social Connections: Unknown (07/14/2021)   Received from [facility]   Social Network    Social Network: Not on file  Intimate Partner Violence: Unknown (06/05/2021)   Received from [facility]   HITS    Physically Hurt: Not on file    Insult or Talk Down To: Not on file    Threaten Physical Harm: Not on file    Scream or Curse: Not on file     PHYSICAL EXAM  Vitals:   08/15/23 1445  BP: 107/74  Pulse: 85  Weight: 206 lb 8 oz (93.7 kg)  Height: 5' 3 (1.6 m)    Body mass index is 36.58 kg/m.   General: The patient is well-developed and well-nourished and in no acute distress   Neurologic Exam  Mental status: The patient is alert and oriented x 3 at the time of the examination. The patient has apparent normal recent and remote memory,  attention span  and concentration ability now seem normal.   Speech is normal.     Cranial nerves:  Extraocular movements are full. Facial strength and sensation is normal. No dysarthria is noted.  No obvious hearing deficits are noted.  Motor:  Muscle bulk is normal.   Tone is mildly increased in legs, left greater than right.. Strength is  5/5 in arms and legs now except 4+/5 left EHL and 4++ ankle extension.   Sensory: Sensory testing is intact to pinprick, soft touch and vibration sensation in arms and legs now.   Coordination: She has good finger-nose-finger.   Heels to shin is reduced on the left   Gait and station: She has a normal station.  The gait is mildly wide and she has a mild left foot drop.  She does not need a cane.   Tandem gait is wide.  Romberg is negative.  Reflexes: Deep tendon reflexes are increased bilaterally in legs.        DIAGNOSTIC DATA (LABS, IMAGING, TESTING) - I reviewed patient records, labs, notes, testing and imaging myself where available.  Lab Results  Component Value Date   WBC 4.7 01/30/2023   HGB 12.3 01/30/2023   HCT 37.2 01/30/2023   MCV 93 01/30/2023   PLT 350 01/30/2023      Component Value Date/Time   NA 145 (H) 02/02/2020 1101   K 5.1 02/02/2020 1101   CL 104 02/02/2020 1101   CO2 26 02/02/2020 1101   GLUCOSE 78 02/02/2020 1101   GLUCOSE 84 09/23/2019 1118   BUN 10 02/02/2020 1101   CREATININE 0.77 02/02/2020 1101   CREATININE 0.82 09/23/2019 1118   CALCIUM 9.2 02/02/2020 1101   PROT 6.5 02/02/2020 1101   ALBUMIN 4.3 02/02/2020 1101   AST 14 02/02/2020 1101   ALT 9 02/02/2020 1101   ALKPHOS 84 02/02/2020 1101   BILITOT <0.2 02/02/2020 1101   GFRNONAA 94 02/02/2020 1101   GFRAA 108 02/02/2020 1101   Lab Results  Component Value Date   CHOL 143 10/30/2017   HDL 62 10/30/2017   LDLCALC 68 10/30/2017   TRIG 64 10/30/2017   CHOLHDL 2.3 10/30/2017   No results found for: HGBA1C Lab Results  Component Value Date   VITAMINB12 348 04/04/2015   Lab Results  Component Value Date   TSH 2.871 08/21/2011        ASSESSMENT AND PLAN  Multiple sclerosis (HCC) - Plan: IgG, IgA, IgM, CBC with Differential/Platelet  High risk medication use - Plan: IgG, IgA, IgM, CBC with Differential/Platelet  Vitamin D  deficiency  Other fatigue  Depression with anxiety  Left foot drop   1.  She is on a lower dose of ocrelizumab  (300 mg every 8 months) due to low IgM, IgG and IgA.  We will check labs again today.  If IgM or IgG drops further Iwe might switch and will consider Tysabri (was JCV negative in 2021) or Mavenclad 2.   Continue vitamin D  supplements 3.   Stay active and try to lose weight if possible.   4.   Continue Pristiq  and gabapentin .   5.   Return in 6 months but call sooner if there are new or worsening neurologic symptoms.   This visit is part of a comprehensive longitudinal care medical relationship regarding the patients primary diagnosis of multiple sclerosis and related concerns.   Sherleen Pangborn A. Godwin Lat, MD, PhD, FAAN Certified in Neurology, Clinical Neurophysiology, Sleep Medicine, Pain Medicine and Neuroimaging Director, [facility] at [facility]  [facility] [address] [place], [place] [address] [phone]

## 2023-08-16 LAB — CBC WITH DIFFERENTIAL/PLATELET
Basophils Absolute: 0.1 10*3/uL (ref 0.0–0.2)
Basos: 1 %
EOS (ABSOLUTE): 0.1 10*3/uL (ref 0.0–0.4)
Eos: 1 %
Hematocrit: 39.4 % (ref 34.0–46.6)
Hemoglobin: 12.7 g/dL (ref 11.1–15.9)
Immature Grans (Abs): 0 10*3/uL (ref 0.0–0.1)
Immature Granulocytes: 0 %
Lymphocytes Absolute: 1.7 10*3/uL (ref 0.7–3.1)
Lymphs: 25 %
MCH: 30.8 pg (ref 26.6–33.0)
MCHC: 32.2 g/dL (ref 31.5–35.7)
MCV: 96 fL (ref 79–97)
Monocytes Absolute: 0.6 10*3/uL (ref 0.1–0.9)
Monocytes: 8 %
Neutrophils Absolute: 4.3 10*3/uL (ref 1.4–7.0)
Neutrophils: 65 %
Platelets: 373 10*3/uL (ref 150–450)
RBC: 4.12 x10E6/uL (ref 3.77–5.28)
RDW: 12.6 % (ref 11.7–15.4)
WBC: 6.7 10*3/uL (ref 3.4–10.8)

## 2023-08-16 LAB — IGG, IGA, IGM
IgA/Immunoglobulin A, Serum: 32 mg/dL — ABNORMAL LOW (ref 87–352)
IgG (Immunoglobin G), Serum: 670 mg/dL (ref 586–1602)
IgM (Immunoglobulin M), Srm: 11 mg/dL — ABNORMAL LOW (ref 26–217)

## 2023-08-19 ENCOUNTER — Ambulatory Visit: Payer: Self-pay | Admitting: Neurology

## 2023-08-20 NOTE — Telephone Encounter (Addendum)
 Called pt and let her know the below Lab Results per Dr. Godwin Lat.   Sending message to [place] from [employer] to schedule pt.   ----- Message from [employer] sent at 08/19/2023  5:52 PM EDT ----- Please let her know that the IgG level is back in the normal range and the IgM is not as low as it was last time.  Therefore, I would like to change from 300 mg every 8 months to 300 mg every 6  months.  Please let [facility] know so that this can get scheduled ----- Message ----- From: Interface, [facility] Lab Results In Sent: 08/16/2023   7:37 AM EDT To: Jorie Newness, MD

## 2023-08-30 IMAGING — MG MM DIGITAL DIAGNOSTIC UNILAT*R* W/ TOMO W/ CAD
4 series · 4 of 12 positions shown · non-contrast
Comparison: Previous exam(s).

CLINICAL DATA: Patient returns today to evaluate a possible RIGHT
breast asymmetry questioned on recent screening mammogram.

EXAM:
DIGITAL DIAGNOSTIC UNILATERAL RIGHT MAMMOGRAM WITH TOMOSYNTHESIS AND
CAD
TECHNIQUE: Right digital diagnostic mammography and breast tomosynthesis was
performed. The images were evaluated with computer-aided detection.

[R ML synth-2D]
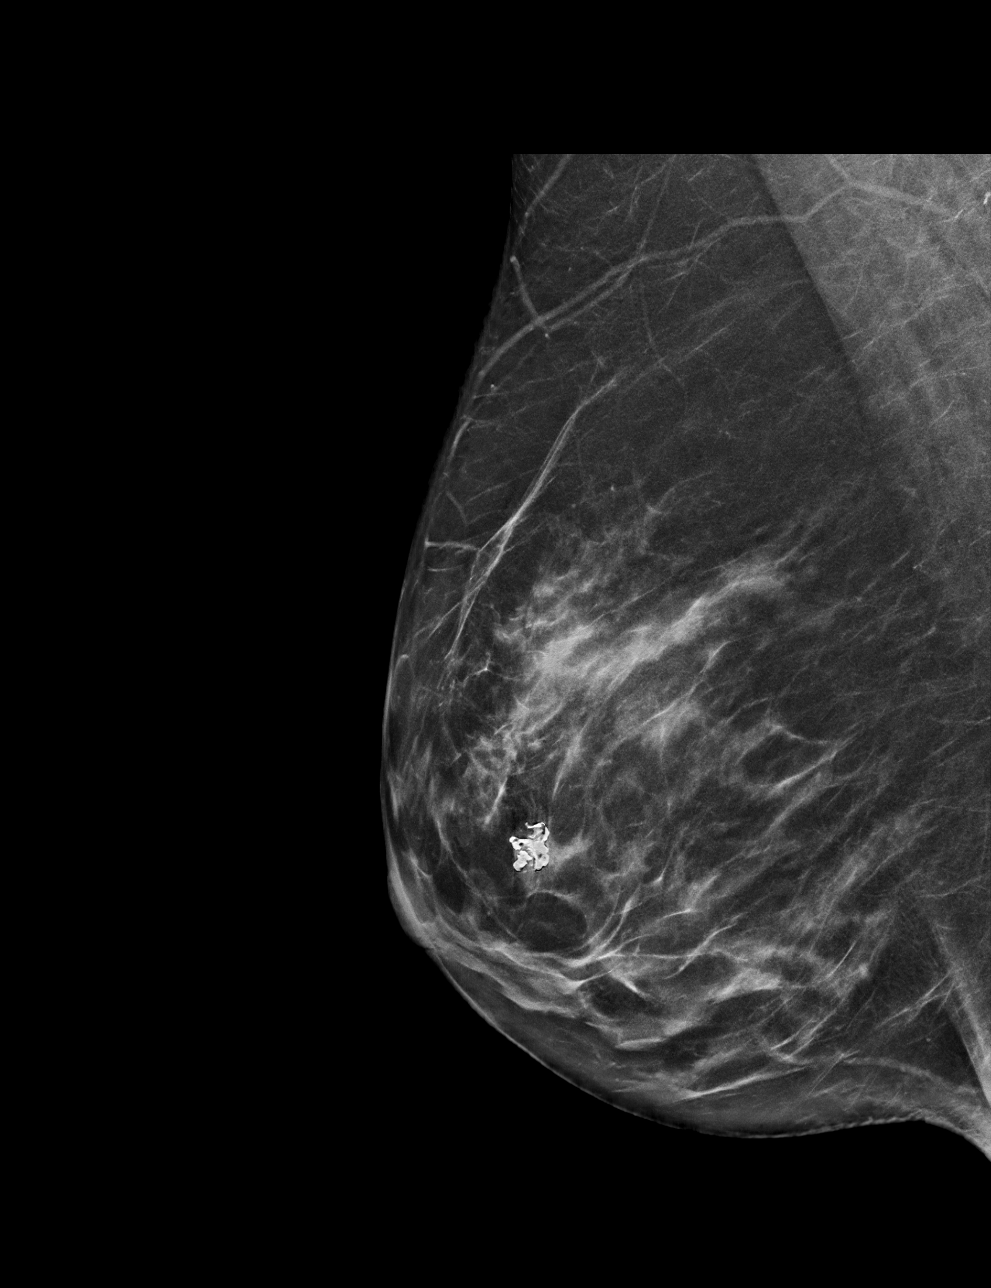

[R MLO synth-2D]
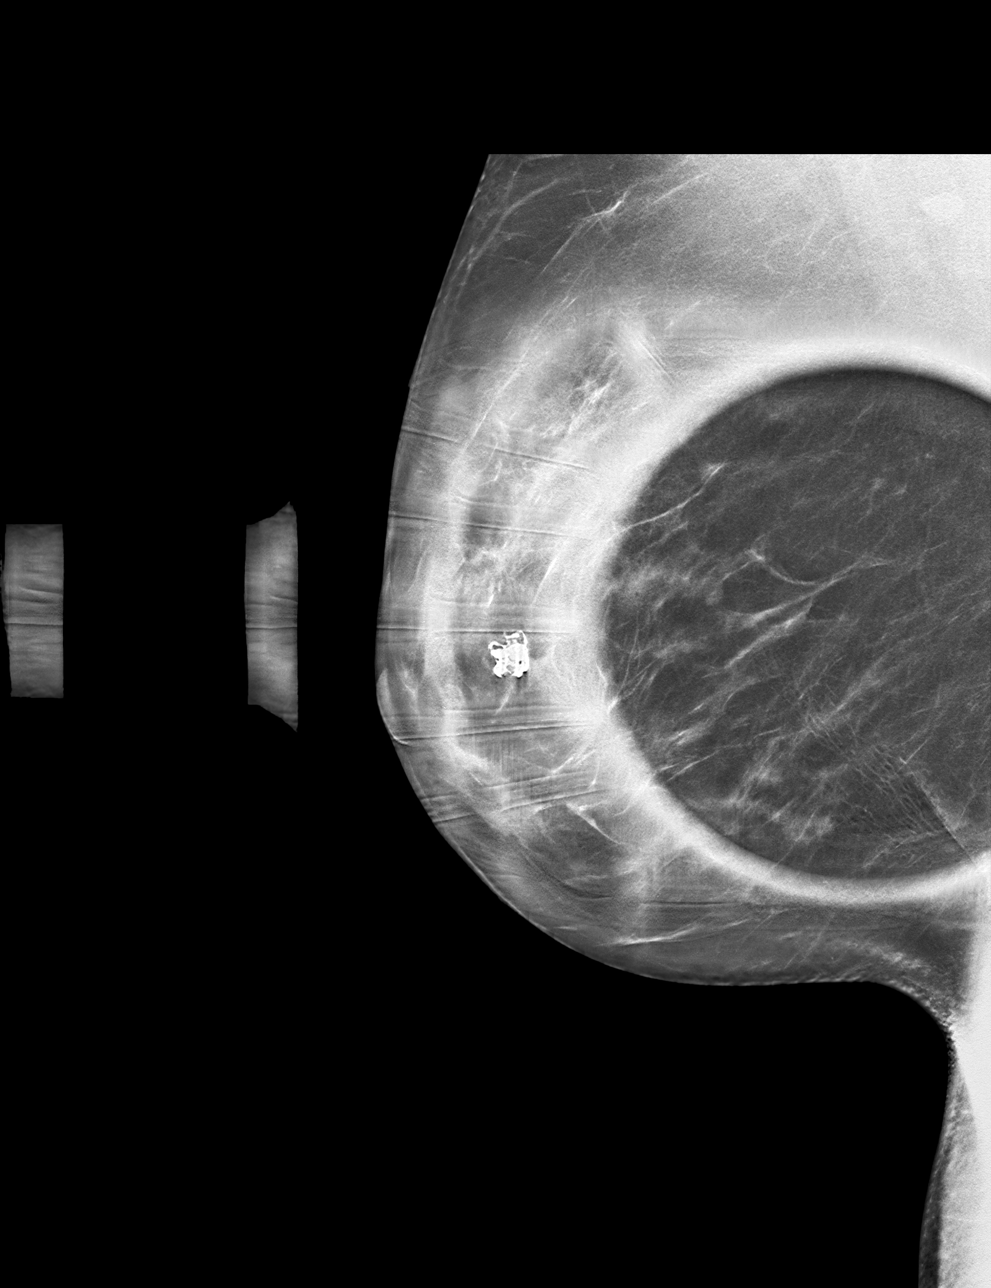

[R ML tomo · tomo slice 33/66.0]
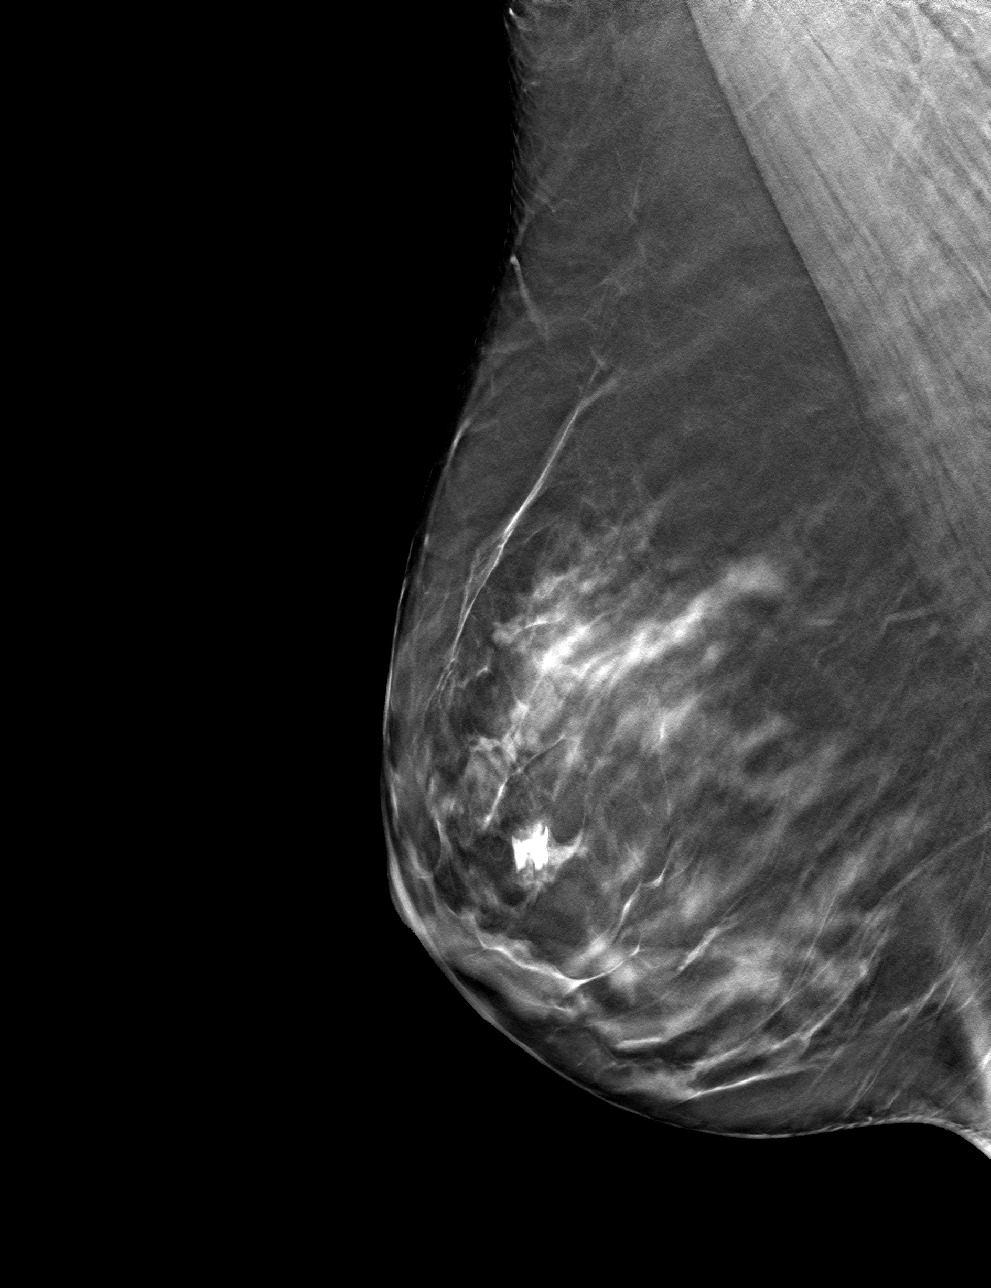

[R MLO tomo · tomo slice 33/65.0]
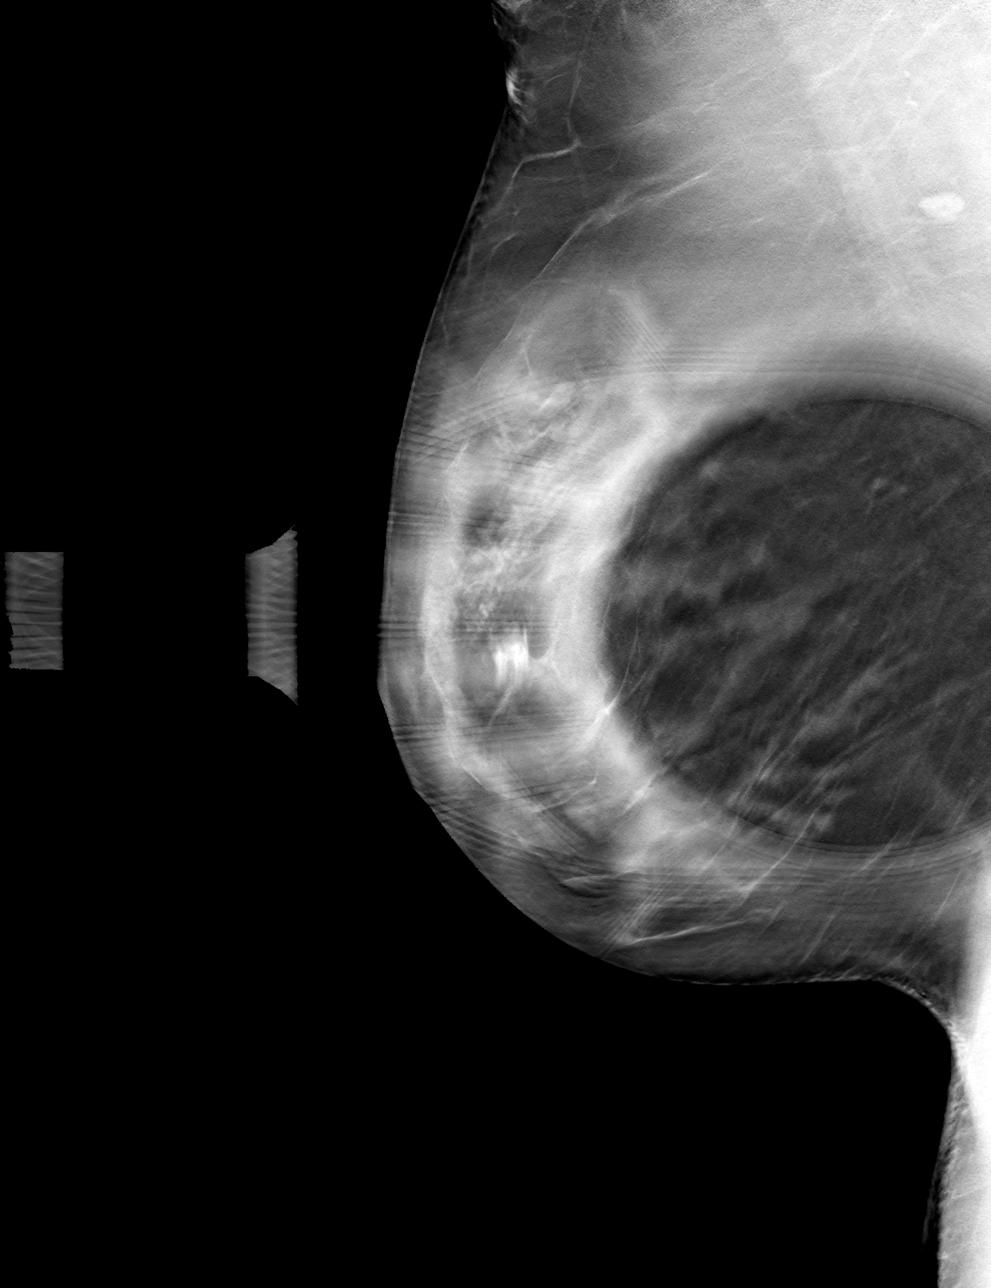

[4 of 12 positions shown; findings below may reference images not displayed]

ACR Breast Density Category c: The breast tissue is heterogeneously
dense, which may obscure small masses.
FINDINGS: On today's additional diagnostic views, including spot compression
with 3D tomosynthesis, there is no persistent asymmetry within the
RIGHT breast indicating superimposition of normal fibroglandular
tissues.
IMPRESSION: No evidence of malignancy.

Patient may return to routine annual bilateral screening mammogram
schedule.

RECOMMENDATION:
Screening mammogram in one year.(Code:DB-A-1TP)

I have discussed the findings and recommendations with the patient.
If applicable, a reminder letter will be sent to the patient
regarding the next appointment.

BI-RADS CATEGORY  1: Negative.

## 2023-10-24 ENCOUNTER — Other Ambulatory Visit: Payer: Self-pay | Admitting: Neurology

## 2023-11-12 ENCOUNTER — Telehealth: Payer: Self-pay | Admitting: *Deleted

## 2023-11-12 NOTE — Telephone Encounter (Signed)
 Faxed completed form below back, received fax confirmation.

## 2023-11-15 ENCOUNTER — Other Ambulatory Visit (INDEPENDENT_AMBULATORY_CARE_PROVIDER_SITE_OTHER)

## 2023-11-15 ENCOUNTER — Ambulatory Visit: Admitting: Gastroenterology

## 2023-11-15 ENCOUNTER — Encounter: Payer: Self-pay | Admitting: Gastroenterology

## 2023-11-15 ENCOUNTER — Ambulatory Visit: Payer: Self-pay | Admitting: Gastroenterology

## 2023-11-15 VITALS — BP 118/74 | HR 90 | Ht 63.0 in | Wt 209.4 lb

## 2023-11-15 DIAGNOSIS — K5900 Constipation, unspecified: Secondary | ICD-10-CM

## 2023-11-15 DIAGNOSIS — K648 Other hemorrhoids: Secondary | ICD-10-CM

## 2023-11-15 DIAGNOSIS — K5909 Other constipation: Secondary | ICD-10-CM | POA: Diagnosis not present

## 2023-11-15 DIAGNOSIS — K625 Hemorrhage of anus and rectum: Secondary | ICD-10-CM

## 2023-11-15 LAB — CBC WITH DIFFERENTIAL/PLATELET
Basophils Absolute: 0.1 K/uL (ref 0.0–0.1)
Basophils Relative: 1.3 % (ref 0.0–3.0)
Eosinophils Absolute: 0.2 K/uL (ref 0.0–0.7)
Eosinophils Relative: 4.2 % (ref 0.0–5.0)
HCT: 37.4 % (ref 36.0–46.0)
Hemoglobin: 12.4 g/dL (ref 12.0–15.0)
Lymphocytes Relative: 25.1 % (ref 12.0–46.0)
Lymphs Abs: 1.3 K/uL (ref 0.7–4.0)
MCHC: 33.2 g/dL (ref 30.0–36.0)
MCV: 92.4 fl (ref 78.0–100.0)
Monocytes Absolute: 0.4 K/uL (ref 0.1–1.0)
Monocytes Relative: 8.5 % (ref 3.0–12.0)
Neutro Abs: 3.1 K/uL (ref 1.4–7.7)
Neutrophils Relative %: 60.9 % (ref 43.0–77.0)
Platelets: 330 K/uL (ref 150.0–400.0)
RBC: 4.04 Mil/uL (ref 3.87–5.11)
RDW: 13.1 % (ref 11.5–15.5)
WBC: 5 K/uL (ref 4.0–10.5)

## 2023-11-15 MED ORDER — TRULANCE 3 MG PO TABS: 3.0000 mg | ORAL_TABLET | Freq: Every day | ORAL | 3 refills | Status: DC

## 2023-11-15 MED ORDER — HYDROCORTISONE ACETATE 25 MG RE SUPP: 25.0000 mg | Freq: Every day | RECTAL | 1 refills | Status: DC

## 2023-11-15 NOTE — Addendum Note (Signed)
 Addended by: BETTIE GRAYCE HERO on: 11/15/2023 03:53 PM   Modules accepted: Orders

## 2023-11-15 NOTE — Patient Instructions (Addendum)
 We have sent the following medications to your pharmacy for you to pick up at your convenience: Anusol  Suppositories  Trulance   Your provider has requested that you go to the basement level for lab work before leaving today. Press B on the elevator. The lab is located at the first door on the left as you exit the elevator.   Drink at least 64 ounces of water daily  START Trulance   START Beneifiber 1 TBSP daily  _______________________________________________________  If your blood pressure at your visit was 140/90 or greater, please contact your primary care physician to follow up on this.  _______________________________________________________  If you are age 42 or older, your body mass index should be between 23-30. Your Body mass index is 37.09 kg/m. If this is out of the aforementioned range listed, please consider follow up with your Primary Care Provider.  If you are age 31 or younger, your body mass index should be between 19-25. Your Body mass index is 37.09 kg/m. If this is out of the aformentioned range listed, please consider follow up with your Primary Care Provider.   ________________________________________________________  The [facility] providers would like to encourage you to use MYCHART to communicate with providers for non-urgent requests or questions.  Due to long hold times on the telephone, sending your provider a message by [facility] may be a faster and more efficient way to get a response.  Please allow 48 business hours for a response.  Please remember that this is for non-urgent requests.  _______________________________________________________  [facility] is using a team-based approach to care.  Your team is made up of your doctor and two to three APPS. Our APPS (Nurse Practitioners and Physician Assistants) work with your physician to ensure care continuity for you. They are fully qualified to address your health concerns and develop a treatment  plan. They communicate directly with your gastroenterologist to care for you. Seeing the Advanced Practice Practitioners on your physician's team can help you by facilitating care more promptly, often allowing for earlier appointments, access to diagnostic testing, procedures, and other specialty referrals.    I appreciate the  opportunity to care for you  Thank You   Camie Heinz,PA-C

## 2023-11-15 NOTE — Progress Notes (Signed)
 Ann Oconnor [id number] 1975-07-20   Chief Complaint: Hemorrhoids  Referring Provider: Loreli Elsie [name], MD Primary GI MD: Dr. Avram  HPI: Ann Oconnor is a 48 y.o. female with past medical history of multiple sclerosis, HLD, seizures (none since 2003), depression/anxiety, obesity, osteopenia who presents today for a complaint of hemorrhoids.    Per referral note, has had worsening constipation and was using Dulcolax once a day with a bowel movement every 2 days.  MiraLAX  was not helpful (was taking once daily).  She was started on Linzess 145 mcg daily.  Labs 06/25/2023 with normal CMP, normal CBC, high cholesterol, normal TSH, low vitamin D    Patient states she has noticed protruding hemorrhoids and intermittent BRBPR for the last 6 months.  She has chronic constipation for which she has previously tried Dulcolax, MiraLAX , and Linzess without improvement.  States that recently she started eating almonds daily and this seems to be helping.  She is not having a bowel movement every other day, though states she is still having to strain and still having some hard stools.  Denies sensation of incomplete evacuation.  In the last 6 months started to notice something protruding from her rectum after bowel movements which reduces spontaneously.  Denies any pain with bowel movements, rectal pain, itching, burning, discomfort.  About twice a week notices BRBPR on the toilet paper mainly, but occasionally in the toilet as well.  Not seeing any blood in the stool itself.  Has not tried anything OTC for hemorrhoids.  Does admit that she does not drink much water during the day.  Has tried a fiber supplement in the past but did not take it consistently and has not tried this again recently.  No other concerns at this time.  Previous GI Procedures/Imaging   Colonoscopy 09/06/2020 - One 1 mm polyp in the rectum, removed with a cold biopsy forceps. Resected and retrieved.  - The examination  was otherwise normal on direct and retroflexion views. - Recall 10 years Path: Surgical [P], colon, rectum, polyp (1) - INFLAMMATORY POLYP (1 OF 1 FRAGMENTS) - NO HIGH-GRADE DYSPLASIA OR MALIGNANCY IDENTIFIED   Past Medical History:  Diagnosis Date   Anxiety    Gait instability    Head injury, closed, with brief LOC (HCC) 2003   fall   Hyperlipidemia    Incontinence    LGSIL (low grade squamous intraepithelial dysplasia) 03/2015   positive high risk HPV subtype 18/45.  Colpo normal with neg ECC   Movement disorder    MS (multiple sclerosis) (HCC) 2002   Neuromuscular disorder (HCC)    MS   Seizures (HCC) 2003   none since 2003   UTI (lower urinary tract infection)    Vision abnormalities     Past Surgical History:  Procedure Laterality Date   CERVICAL CONE BIOPSY  2008   CIN 2   COLONOSCOPY     2010   PORT-A-CATH REMOVAL N/A 02/16/2016   Procedure: REMOVAL PORT-A-CATH;  Surgeon: Donnice Lima, MD;  Location: [facility] OR;  Service: General;  Laterality: N/A;   PORTACATH PLACEMENT Left 08/18/2014   PORTACATH PLACEMENT Left 08/18/2014   Procedure: LEFT SUBCLAVIAN VEIN PORT PLACEMENT;  Surgeon: Donnice Lima, MD;  Location: [facility] OR;  Service: General;  Laterality: Left;    Current Outpatient Medications  Medication Sig Dispense Refill   desvenlafaxine  (PRISTIQ ) 100 MG 24 hr tablet TAKE ONE TABLET(100 MG) BY MOUTH DAILY 90 tablet 0   gabapentin  (NEURONTIN ) 400 MG capsule TAKE  1 CAPSULE(400 MG) BY MOUTH TWICE DAILY 180 capsule 3   hydrocortisone  (ANUSOL -HC) 25 MG suppository Place 1 suppository (25 mg total) rectally at bedtime. 7 suppository 1   levothyroxine (SYNTHROID) 50 MCG tablet Take 50 mcg by mouth daily before breakfast.     ocrelizumab  (OCREVUS ) 300 MG/10ML injection 300mg  IV every 8 months 10 mL 0   No current facility-administered medications for this visit.    Allergies as of 11/15/2023   (No Known Allergies)    Family History  Problem Relation Age of Onset    Thyroid disease Mother    Heart disease Father    Mitral valve prolapse Father    Cancer Father        lung/brain- tumors   Thyroid disease Sister    Thyroid disease Sister    Colon cancer Paternal Aunt    Cancer Paternal Aunt        not sure what kind, maybe cervix   Breast cancer Neg Hx    Colon polyps Neg Hx    Esophageal cancer Neg Hx    Rectal cancer Neg Hx    Stomach cancer Neg Hx     Social History   Tobacco Use   Smoking status: Never   Smokeless tobacco: Never  Vaping Use   Vaping status: Never Used  Substance Use Topics   Alcohol use: No   Drug use: No     Review of Systems:    Constitutional: No unexplained weight loss, fever, chills Cardiovascular: No chest pain Respiratory: No SOB Gastrointestinal: See HPI and otherwise negative Hematologic: BRBPR    Physical Exam:  Vital signs: BP 118/74   Pulse 90   Ht 5' 3 (1.6 m)   Wt 209 lb 6 oz (95 kg)   LMP 07/18/2017   BMI 37.09 kg/m   Constitutional: Pleasant, obese female in NAD, alert and cooperative Head:  Normocephalic and atraumatic.  Eyes: No scleral icterus.  Respiratory: Respirations even and unlabored. Lungs clear to auscultation bilaterally.  No wheezes, crackles, or rhonchi.  Cardiovascular:  Regular rate and rhythm. No murmurs. No peripheral edema. Gastrointestinal:  Soft, nondistended, nontender. No rebound or guarding. Normal bowel sounds. No appreciable masses or hepatomegaly. Rectal: Possibly small external hemorrhoid which is nonthrombosed and nonbleeding, no fissures.  Nonbleeding internal hemorrhoids seen on anoscopy.  Chaperone present for exam. Neurologic:  Alert and oriented x4;  grossly normal neurologically.  Skin:   Dry and intact without significant lesions or rashes. Psychiatric: Oriented to person, place and time. Demonstrates good judgement and reason without abnormal affect or behaviors.   RELEVANT LABS AND IMAGING: CBC    Component Value Date/Time   WBC 6.7 08/15/2023  1541   WBC 4.7 02/16/2016 0851   RBC 4.12 08/15/2023 1541   RBC 4.14 02/16/2016 0851   HGB 12.7 08/15/2023 1541   HCT 39.4 08/15/2023 1541   PLT 373 08/15/2023 1541   MCV 96 08/15/2023 1541   MCH 30.8 08/15/2023 1541   MCH 30.9 02/16/2016 0851   MCHC 32.2 08/15/2023 1541   MCHC 32.5 02/16/2016 0851   RDW 12.6 08/15/2023 1541   LYMPHSABS 1.7 08/15/2023 1541   MONOABS 0.3 05/21/2014 0945   EOSABS 0.1 08/15/2023 1541   BASOSABS 0.1 08/15/2023 1541    CMP     Component Value Date/Time   NA 145 (H) 02/02/2020 1101   K 5.1 02/02/2020 1101   CL 104 02/02/2020 1101   CO2 26 02/02/2020 1101   GLUCOSE 78 02/02/2020 1101  GLUCOSE 84 09/23/2019 1118   BUN 10 02/02/2020 1101   CREATININE 0.77 02/02/2020 1101   CREATININE 0.82 09/23/2019 1118   CALCIUM 9.2 02/02/2020 1101   PROT 6.5 02/02/2020 1101   ALBUMIN 4.3 02/02/2020 1101   AST 14 02/02/2020 1101   ALT 9 02/02/2020 1101   ALKPHOS 84 02/02/2020 1101   BILITOT <0.2 02/02/2020 1101   GFRNONAA 94 02/02/2020 1101   GFRAA 108 02/02/2020 1101     Assessment/Plan:   Chronic constipation Internal hemorrhoids Rectal bleeding Patient seen today for evaluation of hemorrhoids.  Has noticed protruding hemorrhoids and intermittent BRBPR over the last 6 months.  Denies rectal pain, burning, itching.  States hemorrhoids spontaneously reduce.  History of chronic constipation for which she has previously tried Dulcolax, MiraLAX , and Linzess without improvement.  Recently started eating almonds daily and this seems to be helping.  Has not recently tried a fiber supplement.  Still having some straining and hard stools with sensation of incomplete evacuation.  Does not drink much water.  On exam has a small external hemorrhoid which is nonthrombosed nonbleeding, and on anoscopy has nonbleeding internal hemorrhoids.  She is interested in definitive treatment with banding.   Last colonoscopy 09/2020 with finding of a 1 mm inflammatory polyp and  otherwise normal with recommended recall in 10 years.  - Labs today: CBC - Will send Anusol  suppositories for patient to use per rectum, 1 at night for 7 days - Schedule hemorrhoid banding - Advised to increase water intake, 64 ounces daily - Recommended increase dietary fiber with addition of Benefiber supplement, 1 tablespoon daily - Start Trulance  3 mg daily   Camie Furbish, PA-C [facility] 11/15/2023, 10:51 AM  Patient Care Team: Loreli Elsie [name], MD as PCP - General (Internal Medicine)

## 2024-01-16 ENCOUNTER — Ambulatory Visit: Admitting: Internal Medicine

## 2024-01-16 ENCOUNTER — Encounter: Payer: Self-pay | Admitting: Internal Medicine

## 2024-01-16 VITALS — BP 108/70 | HR 80 | Ht 63.0 in | Wt 217.5 lb

## 2024-01-16 DIAGNOSIS — K5900 Constipation, unspecified: Secondary | ICD-10-CM | POA: Diagnosis not present

## 2024-01-16 DIAGNOSIS — K648 Other hemorrhoids: Secondary | ICD-10-CM

## 2024-01-16 NOTE — Patient Instructions (Signed)
 _______________________________________________________  If your blood pressure at your visit was 140/90 or greater, please contact your primary care physician to follow up on this.  _______________________________________________________  If you are age 49 or older, your body mass index should be between 23-30. Your Body mass index is 38.53 kg/m. If this is out of the aforementioned range listed, please consider follow up with your Primary Care Provider.  If you are age 47 or younger, your body mass index should be between 19-25. Your Body mass index is 38.53 kg/m. If this is out of the aformentioned range listed, please consider follow up with your Primary Care Provider.   ________________________________________________________  The [facility] providers would like to encourage you to use MYCHART to communicate with providers for non-urgent requests or questions.  Due to long hold times on the telephone, sending your provider a message by [facility] may be a faster and more efficient way to get a response.  Please allow 48 business hours for a response.  Please remember that this is for non-urgent requests.  _______________________________________________________  [facility] is using a team-based approach to care.  Your team is made up of your doctor and two to three APPS. Our APPS (Nurse Practitioners and Physician Assistants) work with your physician to ensure care continuity for you. They are fully qualified to address your health concerns and develop a treatment plan. They communicate directly with your gastroenterologist to care for you. Seeing the Advanced Practice Practitioners on your physician's team can help you by facilitating care more promptly, often allowing for earlier appointments, access to diagnostic testing, procedures, and other specialty referrals.   I appreciate the opportunity to care for you. Lupita Commander, MD, [facility]

## 2024-01-16 NOTE — Progress Notes (Signed)
   Ann Oconnor 48 y.o. May 28, 1975 [id number]  Assessment & Plan:   Encounter Diagnoses  Name Primary?   Internal hemorrhoids Yes   Constipation, unspecified constipation type     Prolapsing hemorrhoid and constipation successfully treated by using Trulance .  Continue that follow-up as needed.  No need for hemorrhoidal ligation today.    Subjective:   Chief Complaint: Hemorrhoids, referred for banding  HPI 48 year old woman seen by Camie Furbish PA-C 11/15/2023, complaining of worsening constipation and a prolapsed hemorrhoid was diagnosed.  She was started on Trulance  and is having daily bowel movements and is very pleased with these results.  She was having problems with a prolapsed internal hemorrhoid and that is not occurring.  No rectal bleeding. No Known Allergies Current Meds  Medication Sig   desvenlafaxine  (PRISTIQ ) 100 MG 24 hr tablet TAKE ONE TABLET(100 MG) BY MOUTH DAILY   gabapentin  (NEURONTIN ) 400 MG capsule TAKE 1 CAPSULE(400 MG) BY MOUTH TWICE DAILY   hydrocortisone  (ANUSOL -HC) 25 MG suppository Place 1 suppository (25 mg total) rectally at bedtime.   levothyroxine (SYNTHROID) 50 MCG tablet Take 50 mcg by mouth daily before breakfast.   ocrelizumab  (OCREVUS ) 300 MG/10ML injection 300mg  IV every 8 months   Plecanatide  (TRULANCE ) 3 MG TABS Take 1 tablet (3 mg total) by mouth daily.   Past Medical History:  Diagnosis Date   Anxiety    Gait instability    Head injury, closed, with brief LOC (HCC) 2003   fall   Hyperlipidemia    Incontinence    LGSIL (low grade squamous intraepithelial dysplasia) 03/2015   positive high risk HPV subtype 18/45.  Colpo normal with neg ECC   Movement disorder    MS (multiple sclerosis) 2002   Neuromuscular disorder (HCC)    MS   Seizures (HCC) 2003   none since 2003   UTI (lower urinary tract infection)    Vision abnormalities    Past Surgical History:  Procedure Laterality Date   CERVICAL CONE BIOPSY  2008   CIN 2    COLONOSCOPY     2010   PORT-A-CATH REMOVAL N/A 02/16/2016   Procedure: REMOVAL PORT-A-CATH;  Surgeon: Donnice Lima, MD;  Location: [facility] OR;  Service: General;  Laterality: N/A;   PORTACATH PLACEMENT Left 08/18/2014   PORTACATH PLACEMENT Left 08/18/2014   Procedure: LEFT SUBCLAVIAN VEIN PORT PLACEMENT;  Surgeon: Donnice Lima, MD;  Location: [facility] OR;  Service: General;  Laterality: Left;   Social History   Social History Narrative   Patient is single, she is disabled she has multiple sclerosis   Never smoker no alcohol or drug products no tobacco   family history includes Cancer in her father and paternal aunt; Colon cancer in her paternal aunt; Heart disease in her father; Mitral valve prolapse in her father; Thyroid disease in her mother, sister, and sister.   Review of Systems As per HPI  Objective:   Physical Exam BP 108/70 (BP Location: Left Arm, Patient Position: Sitting, Cuff Size: Normal)   Pulse 80   Ht 5' 3 (1.6 m)   Wt 217 lb 8 oz (98.7 kg)   LMP 07/18/2017   BMI 38.53 kg/m  Patti Jordan, CMA present.  Rectal exam reveals a normal anoderm and digital rectal examination.  There is no prolapse with Valsalva.  Anoscopy is performed and shows a grade 2 right posterior internal hemorrhoid complex and grade 1 internal hemorrhoid complexes otherwise.

## 2024-01-22 ENCOUNTER — Other Ambulatory Visit: Payer: Self-pay | Admitting: Neurology

## 2024-01-23 ENCOUNTER — Encounter: Payer: Self-pay | Admitting: Nurse Practitioner

## 2024-01-23 ENCOUNTER — Ambulatory Visit: Admitting: Nurse Practitioner

## 2024-01-23 ENCOUNTER — Other Ambulatory Visit (HOSPITAL_COMMUNITY)
Admission: RE | Admit: 2024-01-23 | Discharge: 2024-01-23 | Disposition: A | Discharge status: Home / Self Care | Source: Ambulatory Visit | Attending: Nurse Practitioner | Admitting: Nurse Practitioner

## 2024-01-23 VITALS — BP 120/78 | HR 103 | Ht 63.0 in | Wt 219.0 lb

## 2024-01-23 DIAGNOSIS — R87612 Low grade squamous intraepithelial lesion on cytologic smear of cervix (LGSIL): Secondary | ICD-10-CM | POA: Diagnosis not present

## 2024-01-23 DIAGNOSIS — Z1331 Encounter for screening for depression: Secondary | ICD-10-CM | POA: Diagnosis not present

## 2024-01-23 DIAGNOSIS — Z01419 Encounter for gynecological examination (general) (routine) without abnormal findings: Secondary | ICD-10-CM

## 2024-01-23 DIAGNOSIS — Z780 Asymptomatic menopausal state: Secondary | ICD-10-CM | POA: Diagnosis not present

## 2024-01-23 DIAGNOSIS — Z6838 Body mass index (BMI) 38.0-38.9, adult: Secondary | ICD-10-CM | POA: Diagnosis not present

## 2024-01-23 DIAGNOSIS — Z124 Encounter for screening for malignant neoplasm of cervix: Secondary | ICD-10-CM | POA: Insufficient documentation

## 2024-01-23 DIAGNOSIS — Z9189 Other specified personal risk factors, not elsewhere classified: Secondary | ICD-10-CM

## 2024-01-23 DIAGNOSIS — Z8262 Family history of osteoporosis: Secondary | ICD-10-CM

## 2024-01-23 DIAGNOSIS — Z1151 Encounter for screening for human papillomavirus (HPV): Secondary | ICD-10-CM | POA: Diagnosis not present

## 2024-01-23 NOTE — Progress Notes (Signed)
 Ann Oconnor 10-17-1975 [id number]   History:  48 y.o. G0 presents for breast and pelvic exam. See pap history below. 2008 LEEP HGSIL, H/O persistent LGSIL, declines colposcopy and wants to monitor with paps at this time. History of vitamin D  deficiency, HLD, MS (diagnosed in 2016). Early menopause at age 54 due to MS medication, elevated FSH at that time. Denies menopausal symptoms. Not sexually active. Interested in GLP-1.   2008 LEEP for HGSIL 2017 pap LGSIL + HPV, negative biopsy 2019 LGSIL neg HR HPV, CIN-1 2020/2021 LGSIL neg HR HPV 03/2020 LGSIL neg HR HPV - patient opted out of colpo 11/2020 ASCUS neg HR HPV 11/2021 Normal neg HR HPV 12/2022 ASCUS neg HPV  Gynecologic History Patient's last menstrual period was 07/19/2014.   Contraception: post menopausal status Sexually active: No  Health maintenance Last Pap: 12/2022. Results were: ASCUS neg HPV Last mammogram: 03/05/2023. Results were: Normal Last colonoscopy: 09/06/2020. Results were: Benign polyp, 10-year recall Last Dexa: Not indicated     01/23/2024    1:57 PM  Depression screen PHQ 2/9  Decreased Interest 0  Down, Depressed, Hopeless 0  PHQ - 2 Score 0     Past medical history, past surgical history, family history and social history were all reviewed and documented in the EPIC chart. 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   ROS:  A ROS was performed and pertinent positives and negatives are included.  Exam:  Vitals:   01/23/24 1353  BP: 120/78  Pulse: (!) 103  SpO2: 99%  Weight: 219 lb (99.3 kg)  Height: 5' 3 (1.6 m)      Body mass index is 38.79 kg/m.  General appearance:  Normal Thyroid:  Symmetrical, normal in size, without palpable masses or nodularity. Respiratory  Auscultation:  Clear without wheezing or rhonchi Cardiovascular  Auscultation:  Regular rate, without rubs, murmurs or gallops  Edema/varicosities:  Not grossly evident Abdominal  Soft,nontender,  without masses, guarding or rebound.  Liver/spleen:  No organomegaly noted  Hernia:  None appreciated  Skin  Inspection:  Grossly normal   Breasts: Examined lying and sitting.   Right: Without masses, retractions, discharge or axillary adenopathy.   Left: Without masses, retractions, discharge or axillary adenopathy. Pelvic: External genitalia:  no lesions              Urethra:  normal appearing urethra with no masses, tenderness or lesions              Bartholins and Skenes: normal                 Vagina: normal appearing vagina with normal color and discharge, no lesions. Atrophic changes              Cervix: no lesions Bimanual Exam:  Uterus:  no masses or tenderness              Adnexa: no mass, fullness, tenderness              Rectovaginal: Deferred              Anus:  normal, no lesions  Ann Oconnor, CMA present as chaperone.    Assessment/Plan:  48 y.o. G for breast and pelvic exam.  Encounter for breast and pelvic examination - Education provided on SBEs, importance of preventative screenings, current guidelines, high calcium diet, regular exercise, and multivitamin daily. Labs with PCP.   Family history of osteoporosis in mother - DXA recommended due to mother's  history, personal use of steroids and early menopause. Planning to schedule with PCP.   Postmenopausal - medication-induced at age 75. No HRT, denies symptoms.   Cervical cancer screening - Plan: Cytology - PAP( [place]). 2008 LEEP HGSIL. H/O persistent LGSIL 2017-03/2019, 11/2020 ASCUS neg HR HPV, 11/2021 normal neg HPV, 12/2022 ASCUS neg HPV. Pap today per guidelines.   BMI 38.0-38.9,adult - interested in GLP-1. She will call her insurance about coverage.   Screening for breast cancer - Normal mammogram history.  Continue annual screenings.  Normal breast exam today  Screening for colon cancer - 09/2020 colonoscopy. Will repeat at GI's recommended interval.   Return in about 1 year (around 01/22/2025) for  B&P (high risk).       Ann Oconnor [facility], 2:17 PM 01/23/2024

## 2024-01-27 LAB — CYTOLOGY - PAP
Comment: NEGATIVE
Diagnosis: NEGATIVE
High risk HPV: NEGATIVE

## 2024-01-28 ENCOUNTER — Ambulatory Visit: Payer: Self-pay | Admitting: Nurse Practitioner

## 2024-01-28 ENCOUNTER — Other Ambulatory Visit: Payer: Self-pay | Admitting: Nurse Practitioner

## 2024-01-28 DIAGNOSIS — B9689 Other specified bacterial agents as the cause of diseases classified elsewhere: Secondary | ICD-10-CM

## 2024-01-28 MED ORDER — METRONIDAZOLE 500 MG PO TABS: 500.0000 mg | ORAL_TABLET | Freq: Two times a day (BID) | ORAL | 0 refills | Status: AC

## 2024-02-05 ENCOUNTER — Other Ambulatory Visit: Payer: Self-pay | Admitting: Neurology

## 2024-02-10 ENCOUNTER — Telehealth: Payer: Self-pay | Admitting: Neurology

## 2024-02-10 NOTE — Telephone Encounter (Signed)
 Patient calling to reschedule appointment due to scheduling conflict

## 2024-02-11 ENCOUNTER — Ambulatory Visit: Admitting: Neurology

## 2024-03-11 ENCOUNTER — Other Ambulatory Visit: Payer: Self-pay | Admitting: Gastroenterology

## 2024-03-18 ENCOUNTER — Other Ambulatory Visit: Payer: Self-pay | Admitting: Internal Medicine

## 2024-03-18 DIAGNOSIS — Z1231 Encounter for screening mammogram for malignant neoplasm of breast: Secondary | ICD-10-CM

## 2024-04-07 ENCOUNTER — Ambulatory Visit

## 2024-04-14 ENCOUNTER — Ambulatory Visit

## 2024-09-24 ENCOUNTER — Ambulatory Visit: Admitting: Neurology
